# Patient Record
Sex: Male | Born: 1965 | Race: White | Hispanic: No | Marital: Married | Smoking: Former smoker
Health system: Southern US, Community
[De-identification: ages and names within clinical notes are randomized; demographics above are authoritative.]

## PROBLEM LIST (undated history)

## (undated) ENCOUNTER — Ambulatory Visit (HOSPITAL_BASED_OUTPATIENT_CLINIC_OR_DEPARTMENT_OTHER): Source: Home / Self Care

## (undated) DIAGNOSIS — G629 Polyneuropathy, unspecified: Secondary | ICD-10-CM

## (undated) DIAGNOSIS — E119 Type 2 diabetes mellitus without complications: Secondary | ICD-10-CM

## (undated) DIAGNOSIS — R112 Nausea with vomiting, unspecified: Secondary | ICD-10-CM

## (undated) DIAGNOSIS — I639 Cerebral infarction, unspecified: Secondary | ICD-10-CM

## (undated) DIAGNOSIS — F449 Dissociative and conversion disorder, unspecified: Secondary | ICD-10-CM

## (undated) DIAGNOSIS — G43909 Migraine, unspecified, not intractable, without status migrainosus: Secondary | ICD-10-CM

## (undated) DIAGNOSIS — G43409 Hemiplegic migraine, not intractable, without status migrainosus: Secondary | ICD-10-CM

## (undated) DIAGNOSIS — I10 Essential (primary) hypertension: Secondary | ICD-10-CM

## (undated) DIAGNOSIS — R531 Weakness: Secondary | ICD-10-CM

## (undated) DIAGNOSIS — F419 Anxiety disorder, unspecified: Secondary | ICD-10-CM

## (undated) DIAGNOSIS — Z9889 Other specified postprocedural states: Secondary | ICD-10-CM

## (undated) DIAGNOSIS — M1990 Unspecified osteoarthritis, unspecified site: Secondary | ICD-10-CM

## (undated) DIAGNOSIS — F329 Major depressive disorder, single episode, unspecified: Secondary | ICD-10-CM

## (undated) DIAGNOSIS — Z8711 Personal history of peptic ulcer disease: Secondary | ICD-10-CM

## (undated) DIAGNOSIS — E785 Hyperlipidemia, unspecified: Secondary | ICD-10-CM

## (undated) DIAGNOSIS — Z87442 Personal history of urinary calculi: Secondary | ICD-10-CM

## (undated) DIAGNOSIS — I519 Heart disease, unspecified: Secondary | ICD-10-CM

## (undated) DIAGNOSIS — Z8619 Personal history of other infectious and parasitic diseases: Secondary | ICD-10-CM

## (undated) DIAGNOSIS — K219 Gastro-esophageal reflux disease without esophagitis: Secondary | ICD-10-CM

## (undated) DIAGNOSIS — F32A Depression, unspecified: Secondary | ICD-10-CM

## (undated) DIAGNOSIS — H9190 Unspecified hearing loss, unspecified ear: Secondary | ICD-10-CM

## (undated) HISTORY — PX: HAND SURGERY: SHX662

## (undated) HISTORY — DX: Heart disease, unspecified: I51.9

## (undated) HISTORY — DX: Depression, unspecified: F32.A

## (undated) HISTORY — PX: TONSILLECTOMY: SUR1361

## (undated) HISTORY — PX: HERNIA REPAIR: SHX51

## (undated) HISTORY — PX: CORONARY ANGIOPLASTY: SHX604

## (undated) HISTORY — DX: Personal history of other infectious and parasitic diseases: Z86.19

## (undated) HISTORY — PX: CHOLECYSTECTOMY: SHX55

## (undated) HISTORY — PX: ADENOIDECTOMY: SUR15

## (undated) HISTORY — DX: Migraine, unspecified, not intractable, without status migrainosus: G43.909

## (undated) HISTORY — PX: COLONOSCOPY W/ ENDOSCOPIC US: SHX1379

## (undated) HISTORY — DX: Anxiety disorder, unspecified: F41.9

## (undated) HISTORY — PX: LITHOTRIPSY: SUR834

## (undated) HISTORY — PX: SHOULDER ARTHROSCOPY WITH OPEN ROTATOR CUFF REPAIR: SHX6092

## (undated) HISTORY — PX: APPENDECTOMY: SHX54

## (undated) HISTORY — DX: Polyneuropathy, unspecified: G62.9

---
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
## 1998-12-28 ENCOUNTER — Emergency Department (HOSPITAL_COMMUNITY): Admission: EM | Admit: 1998-12-28 | Discharge: 1998-12-28 | Payer: Self-pay | Admitting: Emergency Medicine

## 1998-12-28 ENCOUNTER — Encounter: Payer: Self-pay | Admitting: Emergency Medicine

## 1999-05-15 ENCOUNTER — Encounter: Payer: Self-pay | Admitting: Emergency Medicine

## 1999-05-15 ENCOUNTER — Emergency Department (HOSPITAL_COMMUNITY): Admission: EM | Admit: 1999-05-15 | Discharge: 1999-05-15 | Payer: Self-pay | Admitting: *Deleted

## 1999-12-18 ENCOUNTER — Emergency Department (HOSPITAL_COMMUNITY): Admission: EM | Admit: 1999-12-18 | Discharge: 1999-12-18 | Payer: Self-pay | Admitting: Emergency Medicine

## 2000-02-01 ENCOUNTER — Emergency Department (HOSPITAL_COMMUNITY): Admission: EM | Admit: 2000-02-01 | Discharge: 2000-02-02 | Payer: Self-pay | Admitting: Emergency Medicine

## 2000-02-01 ENCOUNTER — Encounter: Payer: Self-pay | Admitting: Emergency Medicine

## 2000-02-02 ENCOUNTER — Encounter: Payer: Self-pay | Admitting: Emergency Medicine

## 2000-03-15 ENCOUNTER — Observation Stay (HOSPITAL_COMMUNITY): Admission: EM | Admit: 2000-03-15 | Discharge: 2000-03-15 | Payer: Self-pay | Admitting: Emergency Medicine

## 2000-03-15 ENCOUNTER — Encounter: Payer: Self-pay | Admitting: Emergency Medicine

## 2000-04-09 ENCOUNTER — Emergency Department (HOSPITAL_COMMUNITY): Admission: EM | Admit: 2000-04-09 | Discharge: 2000-04-09 | Payer: Self-pay | Admitting: Emergency Medicine

## 2000-07-05 ENCOUNTER — Encounter: Payer: Self-pay | Admitting: Emergency Medicine

## 2000-07-05 ENCOUNTER — Emergency Department (HOSPITAL_COMMUNITY): Admission: EM | Admit: 2000-07-05 | Discharge: 2000-07-05 | Payer: Self-pay

## 2000-07-08 ENCOUNTER — Inpatient Hospital Stay (HOSPITAL_COMMUNITY): Admission: EM | Admit: 2000-07-08 | Discharge: 2000-07-14 | Payer: Self-pay | Admitting: Orthopedic Surgery

## 2000-07-12 ENCOUNTER — Encounter: Payer: Self-pay | Admitting: Orthopedic Surgery

## 2000-07-22 ENCOUNTER — Ambulatory Visit (HOSPITAL_BASED_OUTPATIENT_CLINIC_OR_DEPARTMENT_OTHER): Admission: RE | Admit: 2000-07-22 | Discharge: 2000-07-22 | Payer: Self-pay | Admitting: Urology

## 2000-08-23 ENCOUNTER — Ambulatory Visit (HOSPITAL_BASED_OUTPATIENT_CLINIC_OR_DEPARTMENT_OTHER): Admission: RE | Admit: 2000-08-23 | Discharge: 2000-08-23 | Payer: Self-pay | Admitting: Urology

## 2000-08-24 ENCOUNTER — Inpatient Hospital Stay (HOSPITAL_COMMUNITY): Admission: EM | Admit: 2000-08-24 | Discharge: 2000-08-25 | Payer: Self-pay | Admitting: Urology

## 2000-12-04 ENCOUNTER — Emergency Department (HOSPITAL_COMMUNITY): Admission: EM | Admit: 2000-12-04 | Discharge: 2000-12-04 | Payer: Self-pay | Admitting: Emergency Medicine

## 2000-12-04 ENCOUNTER — Encounter: Payer: Self-pay | Admitting: Emergency Medicine

## 2001-04-09 ENCOUNTER — Emergency Department (HOSPITAL_COMMUNITY): Admission: EM | Admit: 2001-04-09 | Discharge: 2001-04-09 | Payer: Self-pay | Admitting: *Deleted

## 2001-08-31 ENCOUNTER — Encounter: Admission: RE | Admit: 2001-08-31 | Discharge: 2001-08-31 | Payer: Self-pay | Admitting: *Deleted

## 2001-11-09 ENCOUNTER — Encounter (INDEPENDENT_AMBULATORY_CARE_PROVIDER_SITE_OTHER): Payer: Self-pay | Admitting: Specialist

## 2001-11-09 ENCOUNTER — Inpatient Hospital Stay (HOSPITAL_COMMUNITY): Admission: EM | Admit: 2001-11-09 | Discharge: 2001-11-11 | Payer: Self-pay

## 2001-11-11 ENCOUNTER — Inpatient Hospital Stay (HOSPITAL_COMMUNITY): Admission: EM | Admit: 2001-11-11 | Discharge: 2001-11-13 | Payer: Self-pay | Admitting: Emergency Medicine

## 2002-03-11 ENCOUNTER — Emergency Department (HOSPITAL_COMMUNITY): Admission: EM | Admit: 2002-03-11 | Discharge: 2002-03-11 | Payer: Self-pay | Admitting: Emergency Medicine

## 2002-03-12 ENCOUNTER — Ambulatory Visit (HOSPITAL_COMMUNITY): Admission: RE | Admit: 2002-03-12 | Discharge: 2002-03-12 | Payer: Self-pay | Admitting: *Deleted

## 2002-04-26 ENCOUNTER — Emergency Department (HOSPITAL_COMMUNITY): Admission: EM | Admit: 2002-04-26 | Discharge: 2002-04-26 | Payer: Self-pay | Admitting: Emergency Medicine

## 2004-07-06 ENCOUNTER — Encounter: Admission: RE | Admit: 2004-07-06 | Discharge: 2004-07-06 | Payer: Self-pay | Admitting: Occupational Medicine

## 2008-04-21 ENCOUNTER — Emergency Department (HOSPITAL_COMMUNITY): Admission: EM | Admit: 2008-04-21 | Discharge: 2008-04-21 | Payer: Self-pay | Admitting: Emergency Medicine

## 2008-07-03 ENCOUNTER — Emergency Department (HOSPITAL_COMMUNITY): Admission: EM | Admit: 2008-07-03 | Discharge: 2008-07-03 | Payer: Self-pay | Admitting: Emergency Medicine

## 2010-01-25 ENCOUNTER — Inpatient Hospital Stay (HOSPITAL_COMMUNITY): Admission: EM | Admit: 2010-01-25 | Discharge: 2010-01-31 | Payer: Self-pay | Admitting: Emergency Medicine

## 2010-10-18 LAB — CBC
HCT: 35.9 % — ABNORMAL LOW (ref 39.0–52.0)
Hemoglobin: 11.7 g/dL — ABNORMAL LOW (ref 13.0–17.0)
Hemoglobin: 12.7 g/dL — ABNORMAL LOW (ref 13.0–17.0)
MCH: 29.9 pg (ref 26.0–34.0)
MCHC: 34.8 g/dL (ref 30.0–36.0)
MCHC: 35.5 g/dL (ref 30.0–36.0)
MCV: 84.3 fL (ref 78.0–100.0)
Platelets: 210 10*3/uL (ref 150–400)
RDW: 13.6 % (ref 11.5–15.5)
RDW: 14 % (ref 11.5–15.5)
WBC: 10.5 10*3/uL (ref 4.0–10.5)

## 2010-10-18 LAB — GLUCOSE, CAPILLARY
Glucose-Capillary: 141 mg/dL — ABNORMAL HIGH (ref 70–99)
Glucose-Capillary: 142 mg/dL — ABNORMAL HIGH (ref 70–99)
Glucose-Capillary: 110 mg/dL — ABNORMAL HIGH (ref 70–99)
Glucose-Capillary: 114 mg/dL — ABNORMAL HIGH (ref 70–99)
Glucose-Capillary: 133 mg/dL — ABNORMAL HIGH (ref 70–99)
Glucose-Capillary: 134 mg/dL — ABNORMAL HIGH (ref 70–99)
Glucose-Capillary: 140 mg/dL — ABNORMAL HIGH (ref 70–99)
Glucose-Capillary: 147 mg/dL — ABNORMAL HIGH (ref 70–99)
Glucose-Capillary: 154 mg/dL — ABNORMAL HIGH (ref 70–99)
Glucose-Capillary: 154 mg/dL — ABNORMAL HIGH (ref 70–99)
Glucose-Capillary: 95 mg/dL (ref 70–99)

## 2010-10-18 LAB — CREATININE, SERUM
Creatinine, Ser: 0.76 mg/dL (ref 0.4–1.5)
GFR calc Af Amer: >60 mL/min (ref >60)

## 2010-10-18 LAB — WOUND CULTURE

## 2010-10-18 LAB — DIFFERENTIAL
Basophils Absolute: 0 10*3/uL (ref 0.0–0.1)
Basophils Absolute: 0.1 10*3/uL (ref 0.0–0.1)
Basophils Relative: 0 % (ref 0–1)
Eosinophils Absolute: 0.3 10*3/uL (ref 0.0–0.7)
Eosinophils Relative: 3 % (ref 0–5)
Lymphocytes Relative: 19 % (ref 12–46)
Monocytes Absolute: 0.6 10*3/uL (ref 0.1–1.0)
Monocytes Relative: 6 % (ref 3–12)
Monocytes Relative: 7 % (ref 3–12)
Neutro Abs: 7.5 10*3/uL (ref 1.7–7.7)
Neutrophils Relative %: 74 % (ref 43–77)

## 2010-10-18 LAB — VANCOMYCIN, TROUGH: Vancomycin Tr: 21.6 ug/mL — ABNORMAL HIGH (ref 10.0–20.0)

## 2010-10-18 LAB — BASIC METABOLIC PANEL
BUN: 7 mg/dL (ref 6–23)
BUN: 9 mg/dL (ref 6–23)
CO2: 28 mEq/L (ref 19–32)
Calcium: 8.7 mg/dL (ref 8.4–10.5)
Chloride: 99 mEq/L (ref 96–112)
Creatinine, Ser: 0.72 mg/dL (ref 0.4–1.5)
GFR calc non Af Amer: >60 mL/min (ref >60)
Glucose, Bld: 166 mg/dL — ABNORMAL HIGH (ref 70–99)
Glucose, Bld: 183 mg/dL — ABNORMAL HIGH (ref 70–99)
Potassium: 3.4 mEq/L — ABNORMAL LOW (ref 3.5–5.1)
Sodium: 135 mEq/L (ref 135–145)
Sodium: 137 mEq/L (ref 135–145)

## 2010-10-18 LAB — HEMOGLOBIN A1C
Hgb A1c MFr Bld: 6.8 % — ABNORMAL HIGH (ref <5.7)
Mean Plasma Glucose: 148 mg/dL — ABNORMAL HIGH (ref <117)

## 2010-12-18 NOTE — Op Note (Signed)
[facility]  Patient:    Jared Wood, Jared Wood                            MRN: [id number] Proc. Date: 07/12/00 Attending:  Aron Baba, M.D.                           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 The patients previously undergone fasciotomy and a carpal tunnel release as well as I&D. He now presents for I&D closure of fasciotomies and pinning of his first [facility] joint.  PREOPERATIVE DIAGNOSES:  Status post compartment syndrome, subacute evolving and acute evolving carpal tunnel syndrome of the left hand with first [facility] joint subluxation and dislocation. The patients previously undergone fasciotomy and a carpal tunnel release as well as I&D. He now presents for I&D closure of fasciotomies and pinning of his first [facility] joint.  PROCEDURE: 1. Incision and drainage skin, subcutaneous, and muscle. 2. Closure of 5-6 cm fasciotomy sites x 2. 3. Reduction and pinning first [facility] joint dislocation.  SURGEON:  Dominica Severin, M.D.  ASSISTANT:  Donavan Burnet, P.A.  COMPLICATIONS:  None.  ANESTHESIA:  General endotracheal.  ESTIMATED BLOOD LOSS:  Minimal.  TOURNIQUET TIME:  Zero.  INDICATIONS FOR PROCEDURE:  Mr. Jared Wood is a very pleasant white male who presents with the above mentioned diagnosis. I have outlined his treatment today and etc. in his chart office notes and on previous operative dictations. He presented Friday with an evolving compartment syndrome. Fasciotomies and carpal tunnel release were performed. The patient was noted to have [facility] joint subluxation and dislocation on initial office visit that Friday. He was taken to the operative suite where compartment syndrome and carpal tunnel syndrome which were evolving and occurring acutely were taken care of with I&D and release. Following  this, he underwent manipulation and placement into a splint. He was taken back 48 hours later for I&D. Fasciotomy sites were not ready for closure. Thus he was taken back today for fasciotomy closure and at this time, I have elected to proceed with pinning of the [facility] joint. I did not want to pin him acutely with open wounds. The patient understands the risks and benefits of surgery, etc. I have outlined this to him at length.  The patient underwent I&D, closure of fasciotomy, followed by pinning of the [facility] joint which was previously dislocated. Manipulation under fluoro was accomplished to document the [facility] stability, etc.  DESCRIPTION OF PROCEDURE:  The patient was seen by myself and anesthesia. He was taken to the operative suite. Preoperative Ancef was given. He was laid supine and appropriately padded and prepped and draped in the usual sterile fashion. A 10 minute surgical Betadine scrub followed by sterile Betadine painting and draping ensued. Once this was done, a sterile field was outlined. The patient had the I&D performed. This included skin, subcutaneous tissue and muscle, excisional debridement of any nonviable tissue was accomplished. The muscles were pink, healthy, contractile and had excellent color. I was very happy with this. Following copious I&D, the patient underwent closure with 4-0 Prolene in an interrupted fashion and he tolerated this well. Once this was accomplished and the areas were closed with difficulty or undue tension, attention was then directed towards the first [facility] joint. Fluoro was brought  in. I was able to document the subluxation and dislocation and following this reduction. The patient was fairly stable to reduction but certainly with manipulative efforts could be pushed out easily. Thus I elected to pin this joint. At present time, I would not recommend a formal ligamentous reconstruction; however, if pinning fails this would be an  operative consideration. Given the fasciotomies that were performed, etc., I elected to pin the joint. I pinned this joint by utilizing 0.45 K wire inserted from the thumb, first metacarpal into the trapezium and from the first to the second metacarpal. This was done under fluoro without difficulty, excellent purchase was obtained. The pins were bent upon themselves, cut and pin caps were placed. They were left outside the skin. They were away from the fasciotomy site. The patient had permanent x-rays taken for documentation. The patient tolerated the procedure well without difficulty. Following this, xeroform was placed about the wound followed by gauze, Webril, Kerlix and thumb spica splint. He was transferred to the recovery room in stable condition. All sponge, needle and instrument counts were reported as correct. There were no complications. DD:  07/12/00 TD:  07/12/00 Job: [id number]
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
## 2010-12-18 NOTE — Cardiovascular Report (Signed)
[place]  Patient:    Jared Wood, Jared Wood                            MRN: [id number] Proc. Date: 03/15/00 Adm. Date:  [id number] Attending:  Nathen May CC:         Daisey Must, M.D. [facility]  Nathen May, M.D., [facility]  Modesto Charon, M.D.   Cardiac Catheterization  DATE OF BIRTH:  05-17-1966.  REFERRING PHYSICIANS Daisey Must, M.D. Nathen May, M.D.  PROCEDURES PERFORMED 1. Selective coronary angiography. 2. Ventriculography.  DIAGNOSES 1. Chest pain of unspecified origin. 2. No evidence of flow-limiting coronary artery disease.  HISTORY:  The patient is a 45 year old white male with no prior cardiac history.  The patient has been referred for diagnostic catheterization to assess coronary anatomy due to continuous substernal chest pain.  DESCRIPTION OF PROCEDURE:  After informed consent was obtained, patient was brought to the catheterization laboratory.  A 6-French arterial sheath was placed in the right femoral artery using the modified Seldinger technique. Subsequent preformed JL4 and JR4 catheters were used to engage the left and right coronary system.  Selective coronary angiography was performed from various projections using manual contract injection.  After coronary angiography, ventriculography was performed and appropriate left-sided hemodynamics were obtained using a 6-French curved pigtail catheter. Ventriculography was then performed in single-plane RAO projection using power injections of contrast.  At termination of the case, all catheters and sheaths were removed.  The patient was sent back to the holding area and no complications were noted.  HEMODYNAMICS:  Left ventricular pressure 110/24 mmHg.  Aortic pressure 110/73 mmHg.  VENTRICULOGRAPHY:  Ejection fraction of 65% with no segmental wall motion abnormalities and no mitral regurgitation noted.  CORONARY ANGIOGRAPHY:  Left main  coronary artery has no evidence of flow-limiting coronary artery disease.  This is a large vessel.  Left anterior descending artery has no evidence of flow-limiting coronary artery disease.  Several diagonal branches arising from the LAD.  Left circumflex coronary artery is a large-caliber vessel with no evidence of flow-limiting coronary artery disease.  Of note is that the LAD and circumflex have near separate ostia and JR5 catheter was used to engage the ostium of the circumflex coronary artery.  Right coronary artery was a large-caliber vessel with no evidence of flow-limiting coronary artery disease.  CONCLUSION 1. Noncardiac chest pain. 2. No evidence of flow-limiting coronary artery disease.  RECOMMENDATION:  Results have been discussed with Dr. Gerri Spore, who has recommended the patient can be discharged to home later today.  Patient can receive a workup for noncardiac chest pain as an outpatient.  There is no evidence of pulmonary embolism, as pretest probability is extremely low.  We will leave further recommendations to Dr. Cliffton Asters. DD:  03/15/00 TD:  03/15/00 Job: [id number]

## 2010-12-18 NOTE — Discharge Summary (Signed)
. [facility]  Patient:    Jared Wood, Jared Wood                            MRN: [id number] Adm. Date:  [id number] Disc. Date: [id number] Attending:  Nathen May Dictator:   Tereso Newcomer, P.A. CC:         Modesto Charon, M.D., Rosalita Levan, [place]   Referring Physician Discharge [facility]  DATE OF BIRTH:  10/21/1965.  DISCHARGE DIAGNOSES 1. Hypertension. 2. Hiatal hernia. 3. Gastroesophageal reflux disease. 4. Status post tonsillectomy. 5. History of aspirin allergy resulting in anaphylaxis seven years ago.  PROCEDURE PERFORMED:  Cardiac catheterization on March 15, 2000 revealing no flow-limiting coronary artery disease and a normal left ventricular function, with an ejection fraction of 65%.  REASON FOR ADMISSION:  Chest pain.  ADMISSION HISTORY:  This 45 year old male with no history of coronary artery disease and a history of hypertension, presented to the emergency room at [facility] with two hours of substernal chest pain radiating to his left arm.  He stated that he had had a stressful day at work and went home at 10 p.m. and went to sleep.  He awoke with chest pain and became diaphoretic 30 minutes later.  His pain continued to escalate.  He decided to call EMS and was brought to the emergency room.  He denied any palpitations, nausea, vomiting, diarrhea, shortness of breath, hematochezia, melena, PND, orthopnea or palpitations; however, he did note some hematemesis occasionally.  He rated his chest pain as a 5-6/10.  PHYSICAL EXAMINATION:  Initial physical exam performed by Dr. Doreatha Martin revealed an alert and oriented male in no acute distress.  Blood pressure 128/61, pulse 75, respirations 14.  NECK:  Without lymphadenopathy.  LUNGS:  Clear to auscultation bilaterally.  CARDIAC: Regular rate and rhythm.  Normal S1 and S2.  No murmurs, rubs, or gallops. ABDOMEN:  Soft, nontender, nondistended.  Positive bowel sounds.   EXTREMITIES: No edema.  RECTAL:  Guaiac negative.  LABORATORY AND X-RAY FINDINGS:  Hemoglobin 14.7, hematocrit 40, WBC 11,200, platelet count 259,000.  Sodium 140, potassium 3.5, chloride 106, CO2 27, BUN 19, creatinine 1.3, glucose 121.  Initial CK 175, CK-MB 1.1, troponin I less than 0.03.  Chest x-ray was without pulmonary edema or infiltrates.  EKG revealed normal sinus rhythm with heart rate of 80, no ST or T wave changes, no Q waves.  HOSPITAL COURSE:  Patient was admitted and ruled out for myocardial infarction by enzymes.  His second set of enzymes revealed a total CK of 112, CK-MB 1.3, troponin I less than 0.03.  Given the patients worrisome chest pain with classical features and his cardiac risk factors which include hypertension and positive family history, it was felt that the patient could go for Cardiolite scan, if his pain abated; however, if his pain continued, it was felt he probably should go for cardiac catheterization, to make a definitive diagnosis.  In the later morning hours of March 15, 2000, it was noted that patient was still having chest pain.  He still was without any EKG changes. It was decided that he should go for cardiac catheterization.  He underwent the procedure without any immediate complications.  The results are noted above.  It was felt that he could be discharged to home once his rest period was up and his groin was stable.  No further cardiac followup would be needed and he could  follow up with his primary care physician for groin check in two weeks.  At the time of this dictation, patients rest period is still ongoing.  DISCHARGE MEDICATIONS:  Dosages and scheduling of the following medications were not available at time of admission.  His medicines are as previously. 1. Maxzide. 2. Propranolol. 3. Prilosec. 4. Allegra.  ACTIVITY:  No heavy lifting, driving, exertion or sex for three days.  DIET:  Low-fat, low-cholesterol, low-sodium  diet.  WOUND CARE:  The patient should watch his groin for any increased swelling, bleeding or bruising.  Call office with concerns.  FOLLOWUP:  He is to follow up with Dr. Modesto Charon in two weeks for groin check and he should call to make an appointment.  At the time of this dictation, Dr. [facility] office is closed. DD:  03/15/00 TD:  03/16/00 Job: [id number]

## 2010-12-18 NOTE — Op Note (Signed)
[facility]  Patient:    Jared Wood, Jared Wood                            MRN: [id number] Proc. Date: 07/10/00 Adm. Date:  [id number] Attending:  Dominica Severin                           Operative Report  DATE OF BIRTH:  2066/05/04  PREOPERATIVE DIAGNOSIS:  Compartment syndrome, left hand, subacute, status post carpal tunnel release and fasciotomies about the left hand.  POSTOPERATIVE DIAGNOSIS:  Compartment syndrome, left hand, subacute, status post carpal tunnel release and fasciotomies about the left hand.  PROCEDURE:  Irrigation and drainage of left hand, including skin, subcutaneous tissue, and muscle, with inspection of muscles for viability and assessment for possible closure.  SURGEON:  Elisha Ponder, M.D.  ASSISTANTS:  None.  COMPLICATIONS:  None.  ESTIMATED BLOOD LOSS:  Minimal.  TOURNIQUET TIME:  Zero.  INDICATIONS FOR THE PROCEDURE:  Jared Wood is a 45 year old white male with the above-mentioned diagnoses, who history has been detailed in his prior operative note 48 hours ago.  The patient has undergone fasciotomies about the hand.  He has had carpal tunnel release as well.  His swelling and digital range of motion have improved dramatically.  We are planning wash out and possible closure based upon wound conditions.  He understands all risks and benefits.  OPERATIVE FINDINGS:  This patient had no signs of infection.  All muscle was viable.  It was very robust and bulging in the thenar and first dorsal interosseous (first dorsal web space) region.  The patient underwent I&D of the skin, subcutaneous tissues, and muscle, as well as assessment for possible closure.  Given the condition of his hand, I felt that the skin was not ready for definitive closure.  He will have a repeat wash out and closure versus split-thickness skin graft in 48 hours.  OPERATIVE PROCEDURE IN DETAIL:  The patient was seen by myself and anesthesia. He was taken  to the operative suite and underwent a smooth induction of general endotracheal anesthesia.  Following this, he was appropriately padded. The bed was turned and he was prepped and draped with the Betadine scrub followed by sterile Betadine painting and draping.  The left upper extremity was isolated with sterile drapes and once this was done, the patient underwent assessment of the fasciotomies.  The carpal tunnel release site and dorsal fasciotomy site over the hand region in the third and fourth web space region were clean and dry without signs of infection, dehiscence, or problems.  The first dorsal web space and thenar musculature region where a fasciotomy had been performed were noted to have large bulging muscle tissue.  This muscle was viable.  I inspected it with pinch for contractility, as well as color and consistency.  There was no obvious necrosis.  At this time, the patient underwent I&D with copious amounts of antibiotic saline.  The I&D includes skin, subcutaneous tissues, and muscle.  He tolerated this well.  Following this, I did inspect the muscle and skin region for possible closure.  I felt that this was too tight for a definite closure at this time and thus loosely approximated the thenar incision and left the first dorsal interosseous incision open.  We will plan for a repeat wash out in 48 hours with possible closure versus split-thickness skin graft as  necessary.  Overall wound conditions are favorable at this time.  We will monitor his status closely as an inpatient and continue IV antibiotics and pain management accordingly and proceed as noted above.  He was extubated in the operative suite and transferred to the recovery room in stable condition.  All sponge, needle, and instrument counts were reported as correct. DD:  07/10/00 TD:  07/10/00 Job: [id number]

## 2010-12-18 NOTE — Op Note (Signed)
[facility]  Patient:    Jared Wood, Jared Wood Visit Number: [id number] MRN: [id number]          Service Type: MED Location: 3W [phone] Attending Physician:  Bonnetta Barry Dictated by:   Velora Heckler, M.D. Proc. Date: 11/09/01 Admit Date:  11/09/2001   CC:         Tinnie Gens C. Quintella Reichert, M.D.   Operative Report  PREOPERATIVE DIAGNOSIS:  Acute appendicitis.  POSTOPERATIVE DIAGNOSIS:  Acute appendicitis.  PROCEDURE:  Laparoscopic appendectomy.  SURGEON:  Velora Heckler, M.D.  ANESTHESIA:  General per Dr. Almeta Monas.  ESTIMATED BLOOD LOSS:  Minimal.  PREPARATION:  Betadine.  COMPLICATIONS:  None.  INDICATIONS FOR PROCEDURE:  The patients a 45 year old white male who presents to the emergency department at the request of his primary care physician, Dr. Feliciana Rossetti, with a two day history of abdominal pain localizing to the right lower quadrant. CT scan of the abdomen and pelvis confirmed acute appendicitis. The patient was seen by general surgery and prepared for the operating room.  DESCRIPTION OF PROCEDURE:  The procedure was done in OR #11 at the [facility]. The patient is brought to the operating room, placed in the supine position on the operating room table. Following the administration of general anesthesia, the patient was prepped and draped in the usual strict aseptic fashion. After ascertaining that an adequate level of anesthesia had been obtained, a supraumbilical incision was made with a 15 blade. Dissection was carried down through the subcutaneous tissues. The fascia was incised in the midline, the peritoneal cavity was entered cautiously. A #0 Vicryl pursestring suture was placed in the fascia. A Hasson cannula was introduced under direct vision and secured with a pursestring suture. The abdomen was insufflated with carbon dioxide. The laparoscope was introduced under direct vision and the abdomen explored. An  operating port is placed in the right upper quadrant. Using a Glassman clamp, the cecum is mobilized. The base of the appendix appears normal. However, the distal 1/2 of the appendix is acutely inflamed, indurated consistent with acute appendicitis. The operating port is also placed in the left lower quadrant. Using the harmonic scalpel, a window is created at the base of the appendix. Using an endoGIA type stapler, the base of the appendix is transected. Good hemostasis was noted at the staple line. The appendiceal mesentery is divided using the harmonic scalpel for hemostasis. The entire appendix is excised and placed into an endocatch bag and withdrawn through the left lower quadrant port. The right lower quadrant is irrigated copiously with fluid which is evacuated. The staple line and mesentery is inspected and good hemostasis is noted. Ports are removed under direct vision. Good hemostasis is noted at port sites. The pneumoperitoneum is release. The #0 Vicryl pursestring suture is tied securely. All three port sites are anesthetized with local anesthetic. All three port sites are closed with interrupted 4-0 Vicryl subcuticular sutures. Benzoin and Steri-Strips are applied. Sterile dressings are applied. The patient is awakened from anesthesia and brought to the recovery room in stable condition. The patient tolerated the procedure well. Dictated by:   Velora Heckler, M.D. Attending Physician:  Bonnetta Barry DD:  11/09/01 TD:  11/10/01 Job: [id number]
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
## 2010-12-18 NOTE — Op Note (Signed)
[place]  Patient:    Jared Wood, Jared Wood                            MRN: [id number] Proc. Date: 08/23/00 Adm. Date:  [id number] Attending:  Ellwood Handler CC:         Arlan Organ, M.D., [place]                           Operative Report  DATE OF BIRTH:  04-24-1966.  REFERRING PHYSICIAN:  Arlan Organ, M.D., [place], [place].  PREOPERATIVE DIAGNOSES:  Phimosis, balanitis, and preputial adhesions.  POSTOPERATIVE DIAGNOSES:  Phimosis, balanitis, and preputial adhesions.  DRAINS:  None.  COMPLICATIONS:  None.  DESCRIPTION OF PROCEDURE:  Patient was prepped and draped in a supine position.  After institution of an adequate level of general anesthesia (LMA), penile block instituted circumferentially around the base of the penis using 0.25% lidocaine without epinephrine.  Circumferential incision was made proximal to the subcoronal sulcus.  A similar incision was made proximal to the original incision, and a ring of fibrotic preputial skin was removed in a "parallel lines technique."  Bleeding sites were lightly fulgurated using needle-tip cautery.  Subcutaneous tissue was reapproximated with interrupted sutures of 4-0 Vicryl.  Skin was reapproximated with interrupted sutures of 4-0 chromic.  Frenular adhesions at the 6 oclock position were then incised horizontally and sewn back vertically with interrupted sutures of 4-0 chromic. Wound was covered with bacitracin ointment, dry gauze, and Coban tape, and patient was returned to recovery in satisfactory condition. DD:  08/23/00 TD:  08/23/00 Job: [id number]

## 2010-12-18 NOTE — Op Note (Signed)
[facility]  Patient:    SILVIA, MARKUSON                            MRN: [id number] Proc. Date: 09/13/00 Adm. Date:  [id number] Disc. Date: [id number] Attending:  Ellwood Handler CC:         Modesto Charon, M.D., [place], [place]   Operative Report  REFERRING PHYSICIAN:  Modesto Charon, M.D., [place], [place]  UROLOGIST:  Verl Dicker, M.D.  PREOPERATIVE DIAGNOSES:  Phimosis and balanitis.  POSTOPERATIVE DIAGNOSES:  Phimosis and balanitis.  PROCEDURE:  Circumcision.  ANESTHESIA:  General.  DRAINS:  None.  COMPLICATIONS:  None.  DESCRIPTION OF PROCEDURE:  The patient was prepped and draped in the dorsal lithotomy position.  After the institution of an adequate level of general anesthesia, fibrotic preputial skin was retracted; somewhat difficult to keep the skin retracted due to patients body habitus (5 feet 9 inches tall, 245 pounds).  A circumferential incision was made proximal to the subcoronal sulcus.  A similar incision was made proximal to the original incision, and ring of fibrotic preputial skin was removed in a "parallel lines technique". Small band of adhesions along the frenulum was then divided horizontally and sewn back together vertically.  Subcutaneous was reapproximated with 3-0 Vicryl.  Skin was reapproximated with 3-0 chromic.  The wound was covered with Bacitracin ointment, Telfa and Coban tape.  The patient was returned to recovery in satisfactory condition. DD:  09/13/00 TD:  09/13/00 Job: [id number]
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
## 2011-09-20 HISTORY — PX: ESOPHAGOGASTRODUODENOSCOPY: SHX1529

## 2012-02-17 ENCOUNTER — Encounter (INDEPENDENT_AMBULATORY_CARE_PROVIDER_SITE_OTHER): Payer: BC Managed Care – PPO | Admitting: Ophthalmology

## 2012-02-17 DIAGNOSIS — H43819 Vitreous degeneration, unspecified eye: Secondary | ICD-10-CM

## 2012-02-17 DIAGNOSIS — T1500XA Foreign body in cornea, unspecified eye, initial encounter: Secondary | ICD-10-CM

## 2012-02-17 DIAGNOSIS — S058X9A Other injuries of unspecified eye and orbit, initial encounter: Secondary | ICD-10-CM

## 2012-02-18 ENCOUNTER — Encounter (INDEPENDENT_AMBULATORY_CARE_PROVIDER_SITE_OTHER): Payer: BC Managed Care – PPO | Admitting: Ophthalmology

## 2012-02-18 DIAGNOSIS — S058X9A Other injuries of unspecified eye and orbit, initial encounter: Secondary | ICD-10-CM

## 2012-02-18 DIAGNOSIS — T1500XA Foreign body in cornea, unspecified eye, initial encounter: Secondary | ICD-10-CM

## 2013-04-11 ENCOUNTER — Encounter (INDEPENDENT_AMBULATORY_CARE_PROVIDER_SITE_OTHER): Payer: Self-pay | Admitting: Ophthalmology

## 2013-05-02 ENCOUNTER — Encounter (INDEPENDENT_AMBULATORY_CARE_PROVIDER_SITE_OTHER): Payer: Managed Care, Other (non HMO) | Admitting: Ophthalmology

## 2013-05-02 DIAGNOSIS — E11319 Type 2 diabetes mellitus with unspecified diabetic retinopathy without macular edema: Secondary | ICD-10-CM

## 2013-05-02 DIAGNOSIS — E1139 Type 2 diabetes mellitus with other diabetic ophthalmic complication: Secondary | ICD-10-CM

## 2013-05-02 DIAGNOSIS — H2510 Age-related nuclear cataract, unspecified eye: Secondary | ICD-10-CM

## 2013-05-02 DIAGNOSIS — I10 Essential (primary) hypertension: Secondary | ICD-10-CM

## 2013-05-02 DIAGNOSIS — H35039 Hypertensive retinopathy, unspecified eye: Secondary | ICD-10-CM

## 2013-05-02 DIAGNOSIS — H43819 Vitreous degeneration, unspecified eye: Secondary | ICD-10-CM

## 2014-05-07 ENCOUNTER — Ambulatory Visit (INDEPENDENT_AMBULATORY_CARE_PROVIDER_SITE_OTHER): Payer: Managed Care, Other (non HMO) | Admitting: Ophthalmology

## 2014-05-07 DIAGNOSIS — H35033 Hypertensive retinopathy, bilateral: Secondary | ICD-10-CM

## 2014-05-07 DIAGNOSIS — E11319 Type 2 diabetes mellitus with unspecified diabetic retinopathy without macular edema: Secondary | ICD-10-CM

## 2014-05-07 DIAGNOSIS — I10 Essential (primary) hypertension: Secondary | ICD-10-CM

## 2014-05-07 DIAGNOSIS — H43813 Vitreous degeneration, bilateral: Secondary | ICD-10-CM

## 2014-05-07 DIAGNOSIS — E11329 Type 2 diabetes mellitus with mild nonproliferative diabetic retinopathy without macular edema: Secondary | ICD-10-CM

## 2015-04-03 DIAGNOSIS — I639 Cerebral infarction, unspecified: Secondary | ICD-10-CM

## 2015-04-03 HISTORY — DX: Cerebral infarction, unspecified: I63.9

## 2015-04-07 DIAGNOSIS — E1159 Type 2 diabetes mellitus with other circulatory complications: Secondary | ICD-10-CM | POA: Insufficient documentation

## 2015-04-07 DIAGNOSIS — I639 Cerebral infarction, unspecified: Secondary | ICD-10-CM

## 2015-04-07 DIAGNOSIS — I2510 Atherosclerotic heart disease of native coronary artery without angina pectoris: Secondary | ICD-10-CM | POA: Insufficient documentation

## 2015-04-07 DIAGNOSIS — I10 Essential (primary) hypertension: Secondary | ICD-10-CM

## 2015-04-07 DIAGNOSIS — E782 Mixed hyperlipidemia: Secondary | ICD-10-CM | POA: Insufficient documentation

## 2015-04-07 DIAGNOSIS — E1149 Type 2 diabetes mellitus with other diabetic neurological complication: Secondary | ICD-10-CM | POA: Insufficient documentation

## 2015-04-07 DIAGNOSIS — E1142 Type 2 diabetes mellitus with diabetic polyneuropathy: Secondary | ICD-10-CM | POA: Insufficient documentation

## 2015-04-07 HISTORY — DX: Mixed hyperlipidemia: E78.2

## 2015-04-07 HISTORY — DX: Type 2 diabetes mellitus with diabetic polyneuropathy: E11.42

## 2015-04-07 HISTORY — DX: Essential (primary) hypertension: I10

## 2015-04-07 HISTORY — DX: Cerebral infarction, unspecified: I63.9

## 2015-04-08 DIAGNOSIS — F450 Somatization disorder: Secondary | ICD-10-CM

## 2015-04-08 HISTORY — DX: Somatization disorder: F45.0

## 2015-04-12 DIAGNOSIS — F444 Conversion disorder with motor symptom or deficit: Secondary | ICD-10-CM

## 2015-04-12 HISTORY — DX: Conversion disorder with motor symptom or deficit: F44.4

## 2015-05-08 ENCOUNTER — Ambulatory Visit (INDEPENDENT_AMBULATORY_CARE_PROVIDER_SITE_OTHER): Payer: Managed Care, Other (non HMO) | Admitting: Ophthalmology

## 2015-06-05 ENCOUNTER — Ambulatory Visit (INDEPENDENT_AMBULATORY_CARE_PROVIDER_SITE_OTHER): Payer: Managed Care, Other (non HMO) | Admitting: Ophthalmology

## 2015-06-30 DIAGNOSIS — R519 Headache, unspecified: Secondary | ICD-10-CM | POA: Insufficient documentation

## 2015-06-30 DIAGNOSIS — F447 Conversion disorder with mixed symptom presentation: Secondary | ICD-10-CM | POA: Insufficient documentation

## 2015-06-30 HISTORY — DX: Headache, unspecified: R51.9

## 2015-06-30 HISTORY — DX: Conversion disorder with mixed symptom presentation: F44.7

## 2015-07-24 DIAGNOSIS — M25562 Pain in left knee: Secondary | ICD-10-CM

## 2015-07-24 DIAGNOSIS — M25512 Pain in left shoulder: Secondary | ICD-10-CM | POA: Insufficient documentation

## 2015-07-24 DIAGNOSIS — G8929 Other chronic pain: Secondary | ICD-10-CM

## 2015-07-24 HISTORY — DX: Pain in left shoulder: M25.512

## 2015-07-24 HISTORY — DX: Other chronic pain: G89.29

## 2015-07-24 HISTORY — DX: Pain in left knee: M25.562

## 2015-08-14 DIAGNOSIS — M1712 Unilateral primary osteoarthritis, left knee: Secondary | ICD-10-CM

## 2015-08-14 HISTORY — DX: Unilateral primary osteoarthritis, left knee: M17.12

## 2015-11-18 ENCOUNTER — Other Ambulatory Visit: Payer: Self-pay | Admitting: Orthopedic Surgery

## 2015-11-18 DIAGNOSIS — Z01818 Encounter for other preprocedural examination: Secondary | ICD-10-CM

## 2015-11-21 ENCOUNTER — Encounter (HOSPITAL_COMMUNITY): Payer: Self-pay

## 2015-11-21 ENCOUNTER — Encounter (HOSPITAL_COMMUNITY)
Admission: RE | Admit: 2015-11-21 | Discharge: 2015-11-21 | Disposition: A | Discharge status: Home / Self Care | Payer: Managed Care, Other (non HMO) | Source: Ambulatory Visit | Attending: Orthopedic Surgery | Admitting: Orthopedic Surgery

## 2015-11-21 DIAGNOSIS — Z01818 Encounter for other preprocedural examination: Secondary | ICD-10-CM | POA: Diagnosis present

## 2015-11-21 DIAGNOSIS — Z0183 Encounter for blood typing: Secondary | ICD-10-CM | POA: Diagnosis not present

## 2015-11-21 DIAGNOSIS — Z01812 Encounter for preprocedural laboratory examination: Secondary | ICD-10-CM | POA: Diagnosis not present

## 2015-11-21 DIAGNOSIS — M1711 Unilateral primary osteoarthritis, right knee: Secondary | ICD-10-CM | POA: Insufficient documentation

## 2015-11-21 HISTORY — DX: Hemiplegic migraine, not intractable, without status migrainosus: G43.409

## 2015-11-21 HISTORY — DX: Gastro-esophageal reflux disease without esophagitis: K21.9

## 2015-11-21 HISTORY — DX: Hyperlipidemia, unspecified: E78.5

## 2015-11-21 HISTORY — DX: Other specified postprocedural states: Z98.890

## 2015-11-21 HISTORY — DX: Nausea with vomiting, unspecified: R11.2

## 2015-11-21 HISTORY — DX: Personal history of peptic ulcer disease: Z87.11

## 2015-11-21 HISTORY — DX: Unspecified osteoarthritis, unspecified site: M19.90

## 2015-11-21 HISTORY — DX: Essential (primary) hypertension: I10

## 2015-11-21 HISTORY — DX: Dissociative and conversion disorder, unspecified: F44.9

## 2015-11-21 HISTORY — DX: Personal history of urinary calculi: Z87.442

## 2015-11-21 HISTORY — DX: Type 2 diabetes mellitus without complications: E11.9

## 2015-11-21 HISTORY — DX: Weakness: R53.1

## 2015-11-21 LAB — CBC WITH DIFFERENTIAL/PLATELET
BASOS PCT: 1 %
Basophils Absolute: 0.1 10*3/uL (ref 0.0–0.1)
EOS ABS: 0.1 10*3/uL (ref 0.0–0.7)
EOS PCT: 2 %
HCT: 43.5 % (ref 39.0–52.0)
HEMOGLOBIN: 14.3 g/dL (ref 13.0–17.0)
LYMPHS ABS: 2.3 10*3/uL (ref 0.7–4.0)
Lymphocytes Relative: 32 %
MCH: 27.3 pg (ref 26.0–34.0)
MCHC: 32.9 g/dL (ref 30.0–36.0)
MCV: 83.2 fL (ref 78.0–100.0)
MONOS PCT: 8 %
Monocytes Absolute: 0.6 10*3/uL (ref 0.1–1.0)
NEUTROS PCT: 57 %
Neutro Abs: 4.1 10*3/uL (ref 1.7–7.7)
PLATELETS: 272 10*3/uL (ref 150–400)
RBC: 5.23 MIL/uL (ref 4.22–5.81)
RDW: 13.7 % (ref 11.5–15.5)
WBC: 7.2 10*3/uL (ref 4.0–10.5)

## 2015-11-21 LAB — URINALYSIS, ROUTINE W REFLEX MICROSCOPIC
BILIRUBIN URINE: NEGATIVE
HGB URINE DIPSTICK: NEGATIVE
Ketones, ur: NEGATIVE mg/dL
Leukocytes, UA: NEGATIVE
Nitrite: NEGATIVE
PROTEIN: NEGATIVE mg/dL
Specific Gravity, Urine: 1.039 — ABNORMAL HIGH (ref 1.005–1.030)
pH: 5.5 (ref 5.0–8.0)

## 2015-11-21 LAB — APTT: aPTT: 29 seconds (ref 24–37)

## 2015-11-21 LAB — COMPREHENSIVE METABOLIC PANEL
ALBUMIN: 4.2 g/dL (ref 3.5–5.0)
ALK PHOS: 41 U/L (ref 38–126)
ALT: 41 U/L (ref 17–63)
ANION GAP: 12 (ref 5–15)
AST: 26 U/L (ref 15–41)
BUN: 17 mg/dL (ref 6–20)
CHLORIDE: 108 mmol/L (ref 101–111)
CO2: 22 mmol/L (ref 22–32)
Calcium: 9.7 mg/dL (ref 8.9–10.3)
Creatinine, Ser: 0.95 mg/dL (ref 0.61–1.24)
GFR calc non Af Amer: >60 mL/min (ref >60)
GLUCOSE: 139 mg/dL — AB (ref 65–99)
POTASSIUM: 4.2 mmol/L (ref 3.5–5.1)
SODIUM: 142 mmol/L (ref 135–145)
Total Bilirubin: 0.6 mg/dL (ref 0.3–1.2)
Total Protein: 7.3 g/dL (ref 6.5–8.1)

## 2015-11-21 LAB — URINE MICROSCOPIC-ADD ON: RBC / HPF: NONE SEEN RBC/hpf (ref 0–5)

## 2015-11-21 LAB — GLUCOSE, CAPILLARY: GLUCOSE-CAPILLARY: 150 mg/dL — AB (ref 65–99)

## 2015-11-21 LAB — SURGICAL PCR SCREEN
MRSA, PCR: NEGATIVE
STAPHYLOCOCCUS AUREUS: POSITIVE — AB

## 2015-11-21 LAB — TYPE AND SCREEN
ABO/RH(D): O POS
ANTIBODY SCREEN: NEGATIVE

## 2015-11-21 LAB — PROTIME-INR
INR: 1.11 (ref 0.00–1.49)
Prothrombin Time: 14.5 seconds (ref 11.6–15.2)

## 2015-11-21 LAB — ABO/RH: ABO/RH(D): O POS

## 2015-11-21 NOTE — Progress Notes (Signed)
   11/21/15 1302  OBSTRUCTIVE SLEEP APNEA  Have you ever been diagnosed with sleep apnea through a sleep study? No  Do you snore loudly (loud enough to be heard through closed doors)?  1  Do you often feel tired, fatigued, or sleepy during the daytime (such as falling asleep during driving or talking to someone)? 1  Has anyone observed you stop breathing during your sleep? 0  Do you have, or are you being treated for high blood pressure? 1  BMI more than 35 kg/m2? 1  Age > 50 (1-yes) 0  Neck circumference greater than:Male 16 inches or larger, Male 17inches or larger? 1  Male Gender (Yes=1) 1  Obstructive Sleep Apnea Score 6  Score 5 or greater  Results sent to PCP   This patient has screened at risk for sleep apnea using the STOP Bang tool used during a pre-surgical visit. A score of 5 or greater is at risk for sleep apnea.

## 2015-11-21 NOTE — Progress Notes (Signed)
PCP is Dance movement psychotherapist is Aviva Kluver  CBG on arrival to PAT was 150, and patient informed Nurse that his blood glucose range from 135 to 95. Patient also stated that his last A1C was 7.   Patient denied having any acute cardiac or pulmonary issues, but informed Nurse that he has a sleep study ordered at [facility] next week.

## 2015-11-21 NOTE — Pre-Procedure Instructions (Signed)
Shashank Barranco  11/21/2015     Your procedure is scheduled on : Monday Dec 01, 2015 at 10:50 AM.  Report to [facility] Admitting at 8:50 AM.  Call this number if you have problems the morning of surgery: [phone]    Remember:  Do not eat food or drink liquids after midnight.  Take these medicines the morning of surgery with A SIP OF WATER : Cetirizine (Zyrtec), Escitalopram (Lexapro), Gabapentin (Neurontin), Omeprazole (Prilosec), Topiramate (Topamax)   Stop taking any vitamins, herbal medications/supplements, NSAIDs, Ibuprofen, Advil, Motrin, Aleve, Fish Oil, etc on Monday April 24th   Do NOT take any diabetic pills the morning of your surgery (NO Glyxambi, NO Metformin/Glucophage)    How to Manage Your Diabetes Before and After Surgery  Why is it important to control my blood sugar before and after surgery? . Improving blood sugar levels before and after surgery helps healing and can limit problems. . A way of improving blood sugar control is eating a healthy diet by: o  Eating less sugar and carbohydrates o  Increasing activity/exercise o  Talking with your doctor about reaching your blood sugar goals . High blood sugars (greater than 180 mg/dL) can raise your risk of infections and slow your recovery, so you will need to focus on controlling your diabetes during the weeks before surgery. . Make sure that the doctor who takes care of your diabetes knows about your planned surgery including the date and location.  How do I manage my blood sugar before surgery? . Check your blood sugar at least 4 times a day, starting 2 days before surgery, to make sure that the level is not too high or low. o Check your blood sugar the morning of your surgery when you wake up and every 2 hours until you get to the Short Stay unit. . If your blood sugar is less than 70 mg/dL, you will need to treat for low blood sugar: o Do not take insulin. o Treat a low blood sugar (less than 70 mg/dL)  with  cup of clear juice (cranberry or apple), 4 glucose tablets, OR glucose gel. o Recheck blood sugar in 15 minutes after treatment (to make sure it is greater than 70 mg/dL). If your blood sugar is not greater than 70 mg/dL on recheck, call [phone] for further instructions. . Report your blood sugar to the short stay nurse when you get to Short Stay.  . If you are admitted to the hospital after surgery: o Your blood sugar will be checked by the staff and you will probably be given insulin after surgery (instead of oral diabetes medicines) to make sure you have good blood sugar levels. o The goal for blood sugar control after surgery is 80-180 mg/dL.      WHAT DO I DO ABOUT MY DIABETES MEDICATION?   Marland Kitchen Do not take oral diabetes medicines (pills) the morning of surgery.  Reviewed and Endorsed by [facility] Patient Education Committee, August 2015   Do not wear jewelry.  Do not wear lotions, powders, or cologne.   Men may shave face and neck.  Do not bring valuables to the hospital.  [facility] is not responsible for any belongings or valuables.  Contacts, dentures or bridgework may not be worn into surgery.  Leave your suitcase in the car.  After surgery it may be brought to your room.  For patients admitted to the hospital, discharge time will be determined by your treatment team.  Patients  discharged the day of surgery will not be allowed to drive home.   Name and phone number of your driver:    Special instructions:  Shower using CHG soap the night before and the morning of your surgery  Please read over the following fact sheets that you were given. Pain Booklet, Coughing and Deep Breathing, Blood Transfusion Information, Total Joint Packet, MRSA Information and Surgical Site Infection Prevention

## 2015-11-22 LAB — URINE CULTURE: CULTURE: NO GROWTH

## 2015-11-22 LAB — HEMOGLOBIN A1C
HEMOGLOBIN A1C: 7 % — AB (ref 4.8–5.6)
MEAN PLASMA GLUCOSE: 154 mg/dL

## 2015-11-30 MED ORDER — ACETAMINOPHEN 500 MG PO TABS
1000.0000 mg | ORAL_TABLET | ORAL | Status: AC
  Administered 2015-12-01: 1000 mg via ORAL
  Filled 2015-11-30: qty 2

## 2015-11-30 MED ORDER — TRANEXAMIC ACID 1000 MG/10ML IV SOLN
1000.0000 mg | INTRAVENOUS | Status: AC
  Administered 2015-12-01: 1000 mg via INTRAVENOUS
  Filled 2015-11-30: qty 10

## 2015-11-30 MED ORDER — SODIUM CHLORIDE 0.9 % IV SOLN: INTRAVENOUS | Status: DC

## 2015-11-30 MED ORDER — CEFAZOLIN SODIUM 10 G IJ SOLR
3.0000 g | INTRAMUSCULAR | Status: AC
  Administered 2015-12-01: 3 g via INTRAVENOUS
  Filled 2015-11-30: qty 3000

## 2015-12-01 ENCOUNTER — Inpatient Hospital Stay (HOSPITAL_COMMUNITY): Payer: Managed Care, Other (non HMO) | Admitting: Anesthesiology

## 2015-12-01 ENCOUNTER — Encounter (HOSPITAL_COMMUNITY)
Admission: RE | Disposition: A | Discharge status: Home / Self Care | Payer: Self-pay | Source: Ambulatory Visit | Attending: Orthopedic Surgery

## 2015-12-01 ENCOUNTER — Encounter (HOSPITAL_COMMUNITY): Payer: Self-pay | Admitting: General Practice

## 2015-12-01 ENCOUNTER — Inpatient Hospital Stay (HOSPITAL_COMMUNITY)
Admission: RE | Admit: 2015-12-01 | Discharge: 2015-12-02 | DRG: 470 | Disposition: A | Discharge status: Home / Self Care | Payer: Managed Care, Other (non HMO) | Source: Ambulatory Visit | Attending: Orthopedic Surgery | Admitting: Orthopedic Surgery

## 2015-12-01 DIAGNOSIS — M1711 Unilateral primary osteoarthritis, right knee: Secondary | ICD-10-CM | POA: Diagnosis present

## 2015-12-01 DIAGNOSIS — Z9861 Coronary angioplasty status: Secondary | ICD-10-CM | POA: Diagnosis not present

## 2015-12-01 DIAGNOSIS — Z87891 Personal history of nicotine dependence: Secondary | ICD-10-CM | POA: Diagnosis not present

## 2015-12-01 DIAGNOSIS — Z96659 Presence of unspecified artificial knee joint: Secondary | ICD-10-CM

## 2015-12-01 DIAGNOSIS — D62 Acute posthemorrhagic anemia: Secondary | ICD-10-CM | POA: Diagnosis not present

## 2015-12-01 DIAGNOSIS — H9190 Unspecified hearing loss, unspecified ear: Secondary | ICD-10-CM | POA: Diagnosis present

## 2015-12-01 DIAGNOSIS — K219 Gastro-esophageal reflux disease without esophagitis: Secondary | ICD-10-CM | POA: Diagnosis present

## 2015-12-01 DIAGNOSIS — E119 Type 2 diabetes mellitus without complications: Secondary | ICD-10-CM | POA: Diagnosis present

## 2015-12-01 DIAGNOSIS — I10 Essential (primary) hypertension: Secondary | ICD-10-CM | POA: Diagnosis present

## 2015-12-01 DIAGNOSIS — Z8673 Personal history of transient ischemic attack (TIA), and cerebral infarction without residual deficits: Secondary | ICD-10-CM | POA: Diagnosis not present

## 2015-12-01 DIAGNOSIS — E785 Hyperlipidemia, unspecified: Secondary | ICD-10-CM | POA: Diagnosis present

## 2015-12-01 DIAGNOSIS — M25561 Pain in right knee: Secondary | ICD-10-CM | POA: Diagnosis present

## 2015-12-01 HISTORY — DX: Presence of unspecified artificial knee joint: Z96.659

## 2015-12-01 HISTORY — DX: Unspecified hearing loss, unspecified ear: H91.90

## 2015-12-01 HISTORY — PX: TOTAL KNEE ARTHROPLASTY: SHX125

## 2015-12-01 HISTORY — DX: Cerebral infarction, unspecified: I63.9

## 2015-12-01 LAB — CBC
HCT: 39.6 % (ref 39.0–52.0)
Hemoglobin: 13 g/dL (ref 13.0–17.0)
MCH: 27.4 pg (ref 26.0–34.0)
MCHC: 32.8 g/dL (ref 30.0–36.0)
MCV: 83.4 fL (ref 78.0–100.0)
PLATELETS: 248 10*3/uL (ref 150–400)
RBC: 4.75 MIL/uL (ref 4.22–5.81)
RDW: 13.5 % (ref 11.5–15.5)
WBC: 5.7 10*3/uL (ref 4.0–10.5)

## 2015-12-01 LAB — CREATININE, SERUM
Creatinine, Ser: 0.97 mg/dL (ref 0.61–1.24)
GFR calc Af Amer: >60 mL/min (ref >60)
GFR calc non Af Amer: >60 mL/min (ref >60)

## 2015-12-01 LAB — GLUCOSE, CAPILLARY
GLUCOSE-CAPILLARY: 126 mg/dL — AB (ref 65–99)
GLUCOSE-CAPILLARY: 108 mg/dL — AB (ref 65–99)
GLUCOSE-CAPILLARY: 157 mg/dL — AB (ref 65–99)
Glucose-Capillary: 131 mg/dL — ABNORMAL HIGH (ref 65–99)

## 2015-12-01 SURGERY — ARTHROPLASTY, KNEE, TOTAL
Anesthesia: Monitor Anesthesia Care | Site: Knee | Laterality: Right

## 2015-12-01 MED ORDER — ONDANSETRON HCL 4 MG/2ML IJ SOLN
4.0000 mg | Freq: Four times a day (QID) | INTRAMUSCULAR | Status: DC | PRN
Start: 2015-12-01 — End: 2015-12-02

## 2015-12-01 MED ORDER — BUPIVACAINE-EPINEPHRINE (PF) 0.5% -1:200000 IJ SOLN
INTRAMUSCULAR | Status: DC | PRN
  Administered 2015-12-01: 30 mL via PERINEURAL

## 2015-12-01 MED ORDER — HYDROMORPHONE HCL 1 MG/ML IJ SOLN
INTRAMUSCULAR | Status: AC
  Filled 2015-12-01: qty 1

## 2015-12-01 MED ORDER — BISACODYL 5 MG PO TBEC: 5.0000 mg | DELAYED_RELEASE_TABLET | Freq: Every day | ORAL | Status: DC | PRN

## 2015-12-01 MED ORDER — BUPIVACAINE-EPINEPHRINE 0.5% -1:200000 IJ SOLN
INTRAMUSCULAR | Status: DC | PRN
  Administered 2015-12-01: 30 mL

## 2015-12-01 MED ORDER — SODIUM CHLORIDE 0.9 % IJ SOLN
INTRAMUSCULAR | Status: DC | PRN
  Administered 2015-12-01: 20 mL

## 2015-12-01 MED ORDER — ONDANSETRON HCL 4 MG PO TABS: 4.0000 mg | ORAL_TABLET | Freq: Four times a day (QID) | ORAL | Status: DC | PRN

## 2015-12-01 MED ORDER — METOCLOPRAMIDE HCL 5 MG/ML IJ SOLN: 5.0000 mg | Freq: Three times a day (TID) | INTRAMUSCULAR | Status: DC | PRN

## 2015-12-01 MED ORDER — DIPHENHYDRAMINE HCL 12.5 MG/5ML PO ELIX: 12.5000 mg | ORAL_SOLUTION | ORAL | Status: DC | PRN

## 2015-12-01 MED ORDER — SODIUM CHLORIDE 0.9 % IR SOLN
Status: DC | PRN
Start: 2015-12-01 — End: 2015-12-01
  Administered 2015-12-01: 1000 mL

## 2015-12-01 MED ORDER — SENNOSIDES-DOCUSATE SODIUM 8.6-50 MG PO TABS: 1.0000 | ORAL_TABLET | Freq: Every evening | ORAL | Status: DC | PRN

## 2015-12-01 MED ORDER — LOSARTAN POTASSIUM 25 MG PO TABS
25.0000 mg | ORAL_TABLET | Freq: Every day | ORAL | Status: DC
  Administered 2015-12-01 – 2015-12-02 (×2): 25 mg via ORAL
  Filled 2015-12-01 (×2): qty 1

## 2015-12-01 MED ORDER — METFORMIN HCL 500 MG PO TABS
1000.0000 mg | ORAL_TABLET | Freq: Two times a day (BID) | ORAL | Status: DC
  Administered 2015-12-01 – 2015-12-02 (×2): 1000 mg via ORAL
  Filled 2015-12-01 (×2): qty 2

## 2015-12-01 MED ORDER — ONDANSETRON HCL 4 MG/2ML IJ SOLN
INTRAMUSCULAR | Status: DC | PRN
  Administered 2015-12-01: 4 mg via INTRAVENOUS

## 2015-12-01 MED ORDER — DEXAMETHASONE SODIUM PHOSPHATE 10 MG/ML IJ SOLN
INTRAMUSCULAR | Status: DC | PRN
Start: 2015-12-01 — End: 2015-12-01
  Administered 2015-12-01: 10 mg via INTRAVENOUS

## 2015-12-01 MED ORDER — BUPIVACAINE-EPINEPHRINE (PF) 0.5% -1:200000 IJ SOLN
INTRAMUSCULAR | Status: AC
  Filled 2015-12-01: qty 30

## 2015-12-01 MED ORDER — MIDAZOLAM HCL 2 MG/2ML IJ SOLN
2.0000 mg | Freq: Once | INTRAMUSCULAR | Status: AC
  Administered 2015-12-01: 2 mg via INTRAVENOUS

## 2015-12-01 MED ORDER — TOPIRAMATE 25 MG PO TABS
25.0000 mg | ORAL_TABLET | Freq: Two times a day (BID) | ORAL | Status: DC
  Administered 2015-12-01 – 2015-12-02 (×2): 25 mg via ORAL
  Filled 2015-12-01 (×2): qty 1

## 2015-12-01 MED ORDER — CHLORHEXIDINE GLUCONATE 4 % EX LIQD: 60.0000 mL | Freq: Once | CUTANEOUS | Status: DC

## 2015-12-01 MED ORDER — BUPIVACAINE IN DEXTROSE 0.75-8.25 % IT SOLN
INTRATHECAL | Status: DC | PRN
  Administered 2015-12-01: 2 mL via INTRATHECAL

## 2015-12-01 MED ORDER — METHOCARBAMOL 1000 MG/10ML IJ SOLN
500.0000 mg | Freq: Four times a day (QID) | INTRAVENOUS | Status: DC | PRN
  Filled 2015-12-01: qty 5

## 2015-12-01 MED ORDER — OXYCODONE HCL 5 MG PO TABS
5.0000 mg | ORAL_TABLET | ORAL | Status: DC | PRN
  Administered 2015-12-01 – 2015-12-02 (×5): 10 mg via ORAL
  Filled 2015-12-01 (×4): qty 2

## 2015-12-01 MED ORDER — CEFAZOLIN SODIUM 1-5 GM-% IV SOLN
1.0000 g | Freq: Four times a day (QID) | INTRAVENOUS | Status: AC
  Administered 2015-12-01 – 2015-12-02 (×2): 1 g via INTRAVENOUS
  Filled 2015-12-01 (×2): qty 50

## 2015-12-01 MED ORDER — DOCUSATE SODIUM 100 MG PO CAPS
100.0000 mg | ORAL_CAPSULE | Freq: Two times a day (BID) | ORAL | Status: DC
  Administered 2015-12-01 – 2015-12-02 (×2): 100 mg via ORAL
  Filled 2015-12-01 (×3): qty 1

## 2015-12-01 MED ORDER — PHENOL 1.4 % MT LIQD: 1.0000 | OROMUCOSAL | Status: DC | PRN

## 2015-12-01 MED ORDER — LACTATED RINGERS IV SOLN
INTRAVENOUS | Status: DC
  Administered 2015-12-01 (×3): via INTRAVENOUS

## 2015-12-01 MED ORDER — PANTOPRAZOLE SODIUM 40 MG PO TBEC
40.0000 mg | DELAYED_RELEASE_TABLET | Freq: Every day | ORAL | Status: DC
  Administered 2015-12-02: 40 mg via ORAL
  Filled 2015-12-01: qty 1

## 2015-12-01 MED ORDER — SODIUM CHLORIDE 0.9 % IV SOLN
INTRAVENOUS | Status: DC
  Administered 2015-12-01: 23:00:00 via INTRAVENOUS

## 2015-12-01 MED ORDER — EPHEDRINE SULFATE 50 MG/ML IJ SOLN
INTRAMUSCULAR | Status: DC | PRN
  Administered 2015-12-01 (×2): 5 mg via INTRAVENOUS
  Administered 2015-12-01 (×2): 10 mg via INTRAVENOUS

## 2015-12-01 MED ORDER — INSULIN ASPART 100 UNIT/ML ~~LOC~~ SOLN: 0.0000 [IU] | Freq: Every day | SUBCUTANEOUS | Status: DC

## 2015-12-01 MED ORDER — HYDROMORPHONE HCL 1 MG/ML IJ SOLN
0.2500 mg | INTRAMUSCULAR | Status: DC | PRN
  Administered 2015-12-01: 1 mg via INTRAVENOUS

## 2015-12-01 MED ORDER — FLEET ENEMA 7-19 GM/118ML RE ENEM: 1.0000 | ENEMA | Freq: Once | RECTAL | Status: DC | PRN

## 2015-12-01 MED ORDER — FENTANYL CITRATE (PF) 250 MCG/5ML IJ SOLN
INTRAMUSCULAR | Status: AC
  Filled 2015-12-01: qty 5

## 2015-12-01 MED ORDER — ESCITALOPRAM OXALATE 10 MG PO TABS
20.0000 mg | ORAL_TABLET | Freq: Every day | ORAL | Status: DC
  Administered 2015-12-02: 20 mg via ORAL
  Filled 2015-12-01: qty 2

## 2015-12-01 MED ORDER — ONDANSETRON HCL 4 MG/2ML IJ SOLN
INTRAMUSCULAR | Status: AC
  Filled 2015-12-01: qty 4

## 2015-12-01 MED ORDER — MIDAZOLAM HCL 2 MG/2ML IJ SOLN
INTRAMUSCULAR | Status: AC
  Filled 2015-12-01: qty 2

## 2015-12-01 MED ORDER — EMPAGLIFLOZIN-LINAGLIPTIN 25-5 MG PO TABS: 1.0000 | ORAL_TABLET | Freq: Every day | ORAL | Status: DC

## 2015-12-01 MED ORDER — ZOLPIDEM TARTRATE 5 MG PO TABS: 5.0000 mg | ORAL_TABLET | Freq: Every evening | ORAL | Status: DC | PRN

## 2015-12-01 MED ORDER — BUPIVACAINE LIPOSOME 1.3 % IJ SUSP
INTRAMUSCULAR | Status: DC | PRN
  Administered 2015-12-01: 20 mL

## 2015-12-01 MED ORDER — TRANEXAMIC ACID 1000 MG/10ML IV SOLN
1000.0000 mg | Freq: Once | INTRAVENOUS | Status: AC
  Administered 2015-12-01: 1000 mg via INTRAVENOUS
  Filled 2015-12-01: qty 10

## 2015-12-01 MED ORDER — ACETAMINOPHEN 650 MG RE SUPP: 650.0000 mg | Freq: Four times a day (QID) | RECTAL | Status: DC | PRN

## 2015-12-01 MED ORDER — DEXAMETHASONE SODIUM PHOSPHATE 10 MG/ML IJ SOLN
INTRAMUSCULAR | Status: AC
  Filled 2015-12-01: qty 1

## 2015-12-01 MED ORDER — ACETAMINOPHEN 325 MG PO TABS
650.0000 mg | ORAL_TABLET | Freq: Four times a day (QID) | ORAL | Status: DC | PRN
Start: 2015-12-01 — End: 2015-12-02

## 2015-12-01 MED ORDER — MENTHOL 3 MG MT LOZG: 1.0000 | LOZENGE | OROMUCOSAL | Status: DC | PRN

## 2015-12-01 MED ORDER — MIDAZOLAM HCL 5 MG/5ML IJ SOLN
INTRAMUSCULAR | Status: DC | PRN
  Administered 2015-12-01: 2 mg via INTRAVENOUS

## 2015-12-01 MED ORDER — DIVALPROEX SODIUM ER 500 MG PO TB24
500.0000 mg | ORAL_TABLET | Freq: Every day | ORAL | Status: DC
  Administered 2015-12-01 – 2015-12-02 (×2): 500 mg via ORAL
  Filled 2015-12-01 (×2): qty 1

## 2015-12-01 MED ORDER — METHOCARBAMOL 500 MG PO TABS
500.0000 mg | ORAL_TABLET | Freq: Four times a day (QID) | ORAL | Status: DC | PRN
  Administered 2015-12-01 – 2015-12-02 (×3): 500 mg via ORAL
  Filled 2015-12-01 (×4): qty 1

## 2015-12-01 MED ORDER — PROPOFOL 500 MG/50ML IV EMUL
INTRAVENOUS | Status: DC | PRN
  Administered 2015-12-01: 75 ug/kg/min via INTRAVENOUS

## 2015-12-01 MED ORDER — FENTANYL CITRATE (PF) 100 MCG/2ML IJ SOLN
INTRAMUSCULAR | Status: AC
  Filled 2015-12-01: qty 2

## 2015-12-01 MED ORDER — CELECOXIB 200 MG PO CAPS
200.0000 mg | ORAL_CAPSULE | Freq: Two times a day (BID) | ORAL | Status: DC
  Administered 2015-12-01: 200 mg via ORAL
  Filled 2015-12-01 (×2): qty 1

## 2015-12-01 MED ORDER — GABAPENTIN 300 MG PO CAPS
300.0000 mg | ORAL_CAPSULE | Freq: Three times a day (TID) | ORAL | Status: DC
  Administered 2015-12-01 – 2015-12-02 (×3): 300 mg via ORAL
  Filled 2015-12-01 (×3): qty 1

## 2015-12-01 MED ORDER — MIDAZOLAM HCL 2 MG/2ML IJ SOLN
INTRAMUSCULAR | Status: AC
Start: 2015-12-01 — End: 2015-12-01
  Filled 2015-12-01: qty 2

## 2015-12-01 MED ORDER — CANAGLIFLOZIN 100 MG PO TABS
100.0000 mg | ORAL_TABLET | Freq: Every day | ORAL | Status: DC
  Administered 2015-12-02: 100 mg via ORAL
  Filled 2015-12-01: qty 1

## 2015-12-01 MED ORDER — METOCLOPRAMIDE HCL 5 MG PO TABS: 5.0000 mg | ORAL_TABLET | Freq: Three times a day (TID) | ORAL | Status: DC | PRN

## 2015-12-01 MED ORDER — LACTATED RINGERS IV SOLN: INTRAVENOUS | Status: DC

## 2015-12-01 MED ORDER — FENOFIBRATE 54 MG PO TABS
54.0000 mg | ORAL_TABLET | Freq: Every day | ORAL | Status: DC
  Administered 2015-12-02: 54 mg via ORAL
  Filled 2015-12-01: qty 1

## 2015-12-01 MED ORDER — PRAVASTATIN SODIUM 40 MG PO TABS
80.0000 mg | ORAL_TABLET | Freq: Every day | ORAL | Status: DC
  Administered 2015-12-01: 80 mg via ORAL
  Filled 2015-12-01 (×2): qty 2

## 2015-12-01 MED ORDER — FENTANYL CITRATE (PF) 100 MCG/2ML IJ SOLN
50.0000 ug | Freq: Once | INTRAMUSCULAR | Status: AC
  Administered 2015-12-01: 50 ug via INTRAVENOUS

## 2015-12-01 MED ORDER — ALUM & MAG HYDROXIDE-SIMETH 200-200-20 MG/5ML PO SUSP: 30.0000 mL | ORAL | Status: DC | PRN

## 2015-12-01 MED ORDER — INSULIN ASPART 100 UNIT/ML ~~LOC~~ SOLN
0.0000 [IU] | Freq: Three times a day (TID) | SUBCUTANEOUS | Status: DC
  Administered 2015-12-01 – 2015-12-02 (×2): 3 [IU] via SUBCUTANEOUS

## 2015-12-01 MED ORDER — BUPIVACAINE LIPOSOME 1.3 % IJ SUSP
20.0000 mL | INTRAMUSCULAR | Status: DC
  Filled 2015-12-01: qty 20

## 2015-12-01 MED ORDER — OXYCODONE HCL ER 10 MG PO T12A
10.0000 mg | EXTENDED_RELEASE_TABLET | Freq: Two times a day (BID) | ORAL | Status: DC
  Administered 2015-12-01: 10 mg via ORAL
  Filled 2015-12-01 (×2): qty 1

## 2015-12-01 MED ORDER — LINAGLIPTIN 5 MG PO TABS
5.0000 mg | ORAL_TABLET | Freq: Every day | ORAL | Status: DC
  Administered 2015-12-02: 5 mg via ORAL
  Filled 2015-12-01: qty 1

## 2015-12-01 MED ORDER — ENOXAPARIN SODIUM 30 MG/0.3ML ~~LOC~~ SOLN
30.0000 mg | Freq: Two times a day (BID) | SUBCUTANEOUS | Status: DC
  Administered 2015-12-02: 30 mg via SUBCUTANEOUS
  Filled 2015-12-01: qty 0.3

## 2015-12-01 MED ORDER — HYDROMORPHONE HCL 1 MG/ML IJ SOLN
1.0000 mg | INTRAMUSCULAR | Status: DC | PRN
  Administered 2015-12-01 – 2015-12-02 (×4): 1 mg via INTRAVENOUS
  Filled 2015-12-01 (×4): qty 1

## 2015-12-01 SURGICAL SUPPLY — 64 items
BANDAGE ESMARK 6X9 LF (GAUZE/BANDAGES/DRESSINGS) ×1 IMPLANT
BLADE SAGITTAL 13X1.27X60 (BLADE) ×2 IMPLANT
BLADE SAGITTAL 13X1.27X60MM (BLADE) ×1
BLADE SAW SGTL 83.5X18.5 (BLADE) ×3 IMPLANT
BLADE SURG 10 STRL SS (BLADE) ×3 IMPLANT
BNDG ESMARK 6X9 LF (GAUZE/BANDAGES/DRESSINGS) ×3
BOWL SMART MIX CTS (DISPOSABLE) ×3 IMPLANT
CAPT KNEE TOTAL 3 ×3 IMPLANT
CEMENT BONE SIMPLEX SPEEDSET (Cement) ×6 IMPLANT
COVER SURGICAL LIGHT HANDLE (MISCELLANEOUS) ×3 IMPLANT
CUFF TOURNIQUET SINGLE 34IN LL (TOURNIQUET CUFF) ×3 IMPLANT
DRAPE EXTREMITY T 121X128X90 (DRAPE) ×3 IMPLANT
DRAPE INCISE IOBAN 66X45 STRL (DRAPES) ×6 IMPLANT
DRAPE PROXIMA HALF (DRAPES) IMPLANT
DRAPE U-SHAPE 47X51 STRL (DRAPES) ×3 IMPLANT
DRSG ADAPTIC 3X8 NADH LF (GAUZE/BANDAGES/DRESSINGS) ×3 IMPLANT
DRSG PAD ABDOMINAL 8X10 ST (GAUZE/BANDAGES/DRESSINGS) ×6 IMPLANT
DURAPREP 26ML APPLICATOR (WOUND CARE) ×6 IMPLANT
ELECT REM PT RETURN 9FT ADLT (ELECTROSURGICAL) ×3
ELECTRODE REM PT RTRN 9FT ADLT (ELECTROSURGICAL) ×1 IMPLANT
GAUZE SPONGE 4X4 12PLY STRL (GAUZE/BANDAGES/DRESSINGS) ×3 IMPLANT
GAUZE XEROFORM 5X9 LF (GAUZE/BANDAGES/DRESSINGS) ×3 IMPLANT
GLOVE BIOGEL M 7.0 STRL (GLOVE) IMPLANT
GLOVE BIOGEL PI IND STRL 7.5 (GLOVE) IMPLANT
GLOVE BIOGEL PI IND STRL 8.5 (GLOVE) ×5 IMPLANT
GLOVE BIOGEL PI INDICATOR 7.5 (GLOVE)
GLOVE BIOGEL PI INDICATOR 8.5 (GLOVE) ×10
GLOVE SURG ORTHO 8.0 STRL STRW (GLOVE) ×18 IMPLANT
GOWN STRL REUS W/ TWL LRG LVL3 (GOWN DISPOSABLE) ×1 IMPLANT
GOWN STRL REUS W/ TWL XL LVL3 (GOWN DISPOSABLE) ×2 IMPLANT
GOWN STRL REUS W/TWL 2XL LVL3 (GOWN DISPOSABLE) ×3 IMPLANT
GOWN STRL REUS W/TWL LRG LVL3 (GOWN DISPOSABLE) ×2
GOWN STRL REUS W/TWL XL LVL3 (GOWN DISPOSABLE) ×4
HANDPIECE INTERPULSE COAX TIP (DISPOSABLE) ×2
HOOD PEEL AWAY FACE SHEILD DIS (HOOD) ×9 IMPLANT
KIT BASIN OR (CUSTOM PROCEDURE TRAY) ×3 IMPLANT
KIT ROOM TURNOVER OR (KITS) ×3 IMPLANT
KNEE CAPITATED TOTAL 3 ×1 IMPLANT
MANIFOLD NEPTUNE II (INSTRUMENTS) ×3 IMPLANT
NEEDLE 22X1 1/2 (OR ONLY) (NEEDLE) ×6 IMPLANT
NS IRRIG 1000ML POUR BTL (IV SOLUTION) ×3 IMPLANT
PACK TOTAL JOINT (CUSTOM PROCEDURE TRAY) ×3 IMPLANT
PACK UNIVERSAL I (CUSTOM PROCEDURE TRAY) ×3 IMPLANT
PAD ARMBOARD 7.5X6 YLW CONV (MISCELLANEOUS) ×6 IMPLANT
PADDING CAST ABS 6INX4YD NS (CAST SUPPLIES) ×2
PADDING CAST ABS COTTON 6X4 NS (CAST SUPPLIES) ×1 IMPLANT
PADDING CAST COTTON 6X4 STRL (CAST SUPPLIES) ×3 IMPLANT
SET HNDPC FAN SPRY TIP SCT (DISPOSABLE) ×1 IMPLANT
SPONGE GAUZE 4X4 12PLY STER LF (GAUZE/BANDAGES/DRESSINGS) ×3 IMPLANT
STAPLER VISISTAT 35W (STAPLE) ×3 IMPLANT
SUCTION FRAZIER HANDLE 10FR (MISCELLANEOUS) ×2
SUCTION TUBE FRAZIER 10FR DISP (MISCELLANEOUS) ×1 IMPLANT
SUT BONE WAX W31G (SUTURE) ×3 IMPLANT
SUT VIC AB 0 CT1 27 (SUTURE) ×4
SUT VIC AB 0 CT1 27XBRD ANBCTR (SUTURE) ×2 IMPLANT
SUT VIC AB 0 CTB1 27 (SUTURE) ×6 IMPLANT
SUT VIC AB 1 CT1 27 (SUTURE) ×4
SUT VIC AB 1 CT1 27XBRD ANBCTR (SUTURE) ×2 IMPLANT
SUT VIC AB 2-0 CT1 27 (SUTURE) ×8
SUT VIC AB 2-0 CT1 TAPERPNT 27 (SUTURE) ×4 IMPLANT
SYR 20CC LL (SYRINGE) ×6 IMPLANT
TOWEL OR 17X24 6PK STRL BLUE (TOWEL DISPOSABLE) ×3 IMPLANT
TOWEL OR 17X26 10 PK STRL BLUE (TOWEL DISPOSABLE) ×3 IMPLANT
WATER STERILE IRR 1000ML POUR (IV SOLUTION) ×6 IMPLANT

## 2015-12-01 NOTE — Transfer of Care (Signed)
Immediate Anesthesia Transfer of Care Note  Patient: Jared Wood  Procedure(s) Performed: Procedure(s): TOTAL KNEE ARTHROPLASTY (Right)  Patient Location: PACU  Anesthesia Type:MAC and Spinal  Level of Consciousness: awake, alert , oriented and sedated  Airway & Oxygen Therapy: Patient Spontanous Breathing and Patient connected to nasal cannula oxygen  Post-op Assessment: Report given to RN, Post -op Vital signs reviewed and stable and Patient moving all extremities  Post vital signs: Reviewed and stable  Last Vitals:  Filed Vitals:   12/01/15 0940 12/01/15 0950  BP: 113/55 112/63  Pulse: 67 72  Temp:    Resp: 16 15    Last Pain:  Filed Vitals:   12/01/15 0953  PainSc: 2       Patients Stated Pain Goal: 4 ([id number])  Complications: No apparent anesthesia complications

## 2015-12-01 NOTE — Anesthesia Preprocedure Evaluation (Addendum)
Anesthesia Evaluation  Patient identified by MRN, date of birth, ID band Patient awake    Reviewed: Allergy & Precautions, NPO status , Patient's Chart, lab work & pertinent test results  History of Anesthesia Complications (+) PONV and history of anesthetic complications  Airway Mallampati: II  TM Distance: >3 FB Neck ROM: Full    Dental  (+) Teeth Intact   Pulmonary former smoker,    breath sounds clear to auscultation       Cardiovascular hypertension, Pt. on medications  Rhythm:Regular Rate:Normal     Neuro/Psych  Headaches, PSYCHIATRIC DISORDERS    GI/Hepatic GERD  Medicated,  Endo/Other  diabetes, Type 2, Oral Hypoglycemic Agents  Renal/GU   negative genitourinary   Musculoskeletal  (+) Arthritis ,   Abdominal   Peds negative pediatric ROS (+)  Hematology negative hematology ROS (+)   Anesthesia Other Findings   Reproductive/Obstetrics negative OB ROS                           Anesthesia Physical Anesthesia Plan  ASA: III  Anesthesia Plan: Spinal   Post-op Pain Management:  Regional for Post-op pain   Induction: Intravenous  Airway Management Planned: Simple Face Mask  Additional Equipment:   Intra-op Plan:   Post-operative Plan:   Informed Consent: I have reviewed the patients History and Physical, chart, labs and discussed the procedure including the risks, benefits and alternatives for the proposed anesthesia with the patient or authorized representative who has indicated his/her understanding and acceptance.   Dental advisory given  Plan Discussed with: CRNA  Anesthesia Plan Comments:        Anesthesia Quick Evaluation

## 2015-12-01 NOTE — Progress Notes (Signed)
Orthopedic Tech Progress Note Patient Details:  Jared Wood [birth year] [id number]  CPM Right Knee CPM Right Knee: On Right Knee Flexion (Degrees): 90 Right Knee Extension (Degrees): 0 Additional Comments: Trapeze bar and foot roll   Jared Wood 12/01/2015, 3:06 PM

## 2015-12-01 NOTE — H&P (Signed)
Jared Wood MRN:  [id number] DOB/SEX:  Jun 19, 1966/male  CHIEF COMPLAINT:  Painful right Knee  HISTORY: Patient is a 50 y.o. male presented with a history of pain in the right knee. Onset of symptoms was gradual starting a few years ago with gradually worsening course since that time. Patient has been treated conservatively with over-the-counter NSAIDs and activity modification. Patient currently rates pain in the knee at 10 out of 10 with activity. There is pain at night.  PAST MEDICAL HISTORY: There are no active problems to display for this patient.  Past Medical History  Diagnosis Date  . Hyperlipemia   . Diabetes mellitus without complication (HCC)     Type 2  . PONV (postoperative nausea and vomiting)     Nausea  . Hypertension   . Conversion disorder   . Hemiplegic migraine   . History of kidney stones   . GERD (gastroesophageal reflux disease)   . Arthritis   . History of bleeding ulcers   . Left-sided weakness    Past Surgical History  Procedure Laterality Date  . Tonsillectomy      age 62  . Appendectomy    . Cholecystectomy    . Shoulder arthroscopy with open rotator cuff repair Right   . Hernia repair    . Hand surgery Left     hand reconstruction  . Colonoscopy w/ endoscopic [place]    . Coronary angioplasty    . Lithotripsy       MEDICATIONS:   No prescriptions prior to admission    ALLERGIES:   Allergies  Allergen Reactions  . Demerol [Meperidine] Anaphylaxis    REVIEW OF SYSTEMS:  A comprehensive review of systems was negative except for: Musculoskeletal: positive for arthralgias and stiff joints   FAMILY HISTORY:  No family history on file.  SOCIAL HISTORY:   Social History  Substance Use Topics  . Smoking status: Former Research scientist (life sciences)  . Smokeless tobacco: Not on file  . Alcohol Use: Yes     Comment: social     EXAMINATION:  Vital signs in last 24 hours:    There were no vitals taken for this visit.  General Appearance:    Alert, cooperative,  no distress, appears stated age  Head:    Normocephalic, without obvious abnormality, atraumatic  Eyes:    PERRL, conjunctiva/corneas clear, EOM's intact, fundi    benign, both eyes       Ears:    Normal TM's and external ear canals, both ears  Nose:   Nares normal, septum midline, mucosa normal, no drainage    or sinus tenderness  Throat:   Lips, mucosa, and tongue normal; teeth and gums normal  Neck:   Supple, symmetrical, trachea midline, no adenopathy;       thyroid:  No enlargement/tenderness/nodules; no carotid   bruit or JVD  Back:     Symmetric, no curvature, ROM normal, no CVA tenderness  Lungs:     Clear to auscultation bilaterally, respirations unlabored  Chest wall:    No tenderness or deformity  Heart:    Regular rate and rhythm, S1 and S2 normal, no murmur, rub   or gallop  Abdomen:     Soft, non-tender, bowel sounds active all four quadrants,    no masses, no organomegaly  Genitalia:    Normal male without lesion, discharge or tenderness  Rectal:    Normal tone, normal prostate, no masses or tenderness;   guaiac negative stool  Extremities:   Extremities normal, atraumatic, no  cyanosis or edema  Pulses:   2+ and symmetric all extremities  Skin:   Skin color, texture, turgor normal, no rashes or lesions  Lymph nodes:   Cervical, supraclavicular, and axillary nodes normal  Neurologic:   CNII-XII intact. Normal strength, sensation and reflexes      throughout    Musculoskeletal:  ROM 0-120, Ligaments intact,  Imaging Review Plain radiographs demonstrate severe degenerative joint disease of the right knee. The overall alignment is neutral. The bone quality appears to be excellent for age and reported activity level.  Assessment/Plan: Primary osteoarthritis, right knee   The patient history, physical examination and imaging studies are consistent with advanced degenerative joint disease of the right knee. The patient has failed conservative treatment.  The clearance notes  were reviewed.  After discussion with the patient it was felt that Total Knee Replacement was indicated. The procedure,  risks, and benefits of total knee arthroplasty were presented and reviewed. The risks including but not limited to aseptic loosening, infection, blood clots, vascular injury, stiffness, patella tracking problems complications among others were discussed. The patient acknowledged the explanation, agreed to proceed with the plan.  Donia Ast 12/01/2015, 6:40 AM

## 2015-12-01 NOTE — Anesthesia Procedure Notes (Addendum)
Anesthesia Regional Block:  Adductor canal block  Pre-Anesthetic Checklist: ,, timeout performed, Correct Patient, Correct Site, Correct Laterality, Correct Procedure, Correct Position, site marked, Risks and benefits discussed,  Surgical consent,  Pre-op evaluation,  At surgeon's request and post-op pain management  Laterality: Right  Prep: chloraprep       Needles:  Injection technique: Single-shot  Needle Type: Echogenic Needle     Needle Length: 9cm 9 cm Needle Gauge: 21 and 21 G    Additional Needles:  Procedures: ultrasound guided (picture in chart) Adductor canal block Narrative:  Start time: 12/01/2015 9:46 AM End time: 12/01/2015 9:48 AM Injection made incrementally with aspirations every 5 mL.  Performed by: Personally  Anesthesiologist: Suella Broad D  Additional Notes: Pt tolerated well. No immediate complications noted.    Spinal Patient location during procedure: OR Staffing Anesthesiologist: Ireland Virrueta Performed by: anesthesiologist  Preanesthetic Checklist Completed: patient identified, site marked, surgical consent, pre-op evaluation, timeout performed, IV checked, risks and benefits discussed and monitors and equipment checked Spinal Block Patient position: sitting Prep: ChloraPrep Patient monitoring: continuous pulse ox, blood pressure and heart rate Approach: midline Injection technique: single-shot Needle Needle type: Sprotte  Needle gauge: 24 G Needle length: 9 cm Additional Notes Functioning IV was confirmed and monitors were applied. Sterile prep and drape, including hand hygiene, mask and sterile gloves were used. The patient was positioned and the spine was prepped. The skin was anesthetized with lidocaine.  Free flow of clear CSF was obtained prior to injecting local anesthetic into the CSF.  The spinal needle aspirated freely following injection.  The needle was carefully withdrawn.  The patient tolerated the procedure well. Consent was  obtained prior to procedure with all questions answered and concerns addressed. Risks including but not limited to bleeding, infection, nerve damage, paralysis, failed block, inadequate analgesia, allergic reaction, high spinal, itching and headache were discussed and the patient wished to proceed.   Lauretta Grill, MD

## 2015-12-01 NOTE — Progress Notes (Signed)
[place] patient's insurance users to set up home health, requested HHPT with start date 12/03/15. Reference  # [id number]. Faxed HHPT order and H and P to [phone]. Will continue to follow.

## 2015-12-02 ENCOUNTER — Encounter (HOSPITAL_COMMUNITY): Payer: Self-pay | Admitting: Orthopedic Surgery

## 2015-12-02 LAB — BASIC METABOLIC PANEL
Anion gap: 14 (ref 5–15)
BUN: 11 mg/dL (ref 6–20)
CHLORIDE: 104 mmol/L (ref 101–111)
CO2: 23 mmol/L (ref 22–32)
CREATININE: 0.94 mg/dL (ref 0.61–1.24)
Calcium: 9.3 mg/dL (ref 8.9–10.3)
Glucose, Bld: 182 mg/dL — ABNORMAL HIGH (ref 65–99)
Potassium: 4.1 mmol/L (ref 3.5–5.1)
SODIUM: 141 mmol/L (ref 135–145)

## 2015-12-02 LAB — CBC
HCT: 38.8 % — ABNORMAL LOW (ref 39.0–52.0)
HEMOGLOBIN: 12.8 g/dL — AB (ref 13.0–17.0)
MCH: 27.5 pg (ref 26.0–34.0)
MCHC: 33 g/dL (ref 30.0–36.0)
MCV: 83.3 fL (ref 78.0–100.0)
PLATELETS: 299 10*3/uL (ref 150–400)
RBC: 4.66 MIL/uL (ref 4.22–5.81)
RDW: 13.4 % (ref 11.5–15.5)
WBC: 12.5 10*3/uL — ABNORMAL HIGH (ref 4.0–10.5)

## 2015-12-02 LAB — GLUCOSE, CAPILLARY
GLUCOSE-CAPILLARY: 195 mg/dL — AB (ref 65–99)
Glucose-Capillary: 175 mg/dL — ABNORMAL HIGH (ref 65–99)

## 2015-12-02 MED ORDER — ONDANSETRON HCL 4 MG PO TABS: 4.0000 mg | ORAL_TABLET | Freq: Four times a day (QID) | ORAL | Status: DC | PRN

## 2015-12-02 MED ORDER — OXYCODONE HCL 5 MG PO TABS: 5.0000 mg | ORAL_TABLET | ORAL | Status: DC | PRN

## 2015-12-02 MED ORDER — METHOCARBAMOL 500 MG PO TABS: 500.0000 mg | ORAL_TABLET | Freq: Four times a day (QID) | ORAL | Status: DC | PRN

## 2015-12-02 MED ORDER — OXYCODONE HCL ER 10 MG PO T12A: 10.0000 mg | EXTENDED_RELEASE_TABLET | Freq: Two times a day (BID) | ORAL | Status: DC

## 2015-12-02 MED ORDER — ENOXAPARIN SODIUM 40 MG/0.4ML ~~LOC~~ SOLN: 40.0000 mg | SUBCUTANEOUS | Status: DC

## 2015-12-02 NOTE — Care Management Note (Signed)
Case Management Note  Patient Details  Name: Jared Wood MRN: [id number] Date of Birth: Apr 10, 1966  Subjective/Objective:             S/p right total knee arthroplasty       Action/Plan: Set up with outpatient PT for 12/04/15 by MD office. [facility] to deliver CPM to patient's home, already had rolling walker and 3N1. Wife will be assisting patient after discharge.  Expected Discharge Date:                  Expected Discharge Plan:  Home/Self Care  In-House Referral:  NA  Discharge planning Services  CM Consult  Post Acute Care Choice:  Durable Medical Equipment Choice offered to:     DME Arranged:  CPM DME Agency:  [facility]  HH Arranged:  NA HH Agency:     Status of Service:  Completed, signed off  Medicare Important Message Given:    Date Medicare IM Given:    Medicare IM give by:    Date Additional Medicare IM Given:    Additional Medicare Important Message give by:     If discussed at [place] of Stay Meetings, dates discussed:    Additional Comments:  [name], RN 12/02/2015, 1:09 PM

## 2015-12-02 NOTE — Op Note (Addendum)
TOTAL KNEE REPLACEMENT OPERATIVE NOTE:  12/01/2015  5:55 PM  PATIENT:  Jared Wood  50 y.o. male  PRE-OPERATIVE DIAGNOSIS:  primary osteoarthritis right knee  POST-OPERATIVE DIAGNOSIS:  primary osteoarthritis right knee  PROCEDURE:  Procedure(s): TOTAL KNEE ARTHROPLASTY  SURGEON:  Surgeon(s): Vickey Huger, MD  PHYSICIAN ASSISTANT: Buck Mam, PAC   ANESTHESIA:   spinal  DRAINS: Hemovac  SPECIMEN: None  COUNTS:  Correct  TOURNIQUET:   Total Tourniquet Time Documented: Thigh (Right) - [id number] minutes Total: Thigh (Right) - [id number] minutes   DICTATION:  Indication for procedure:    The patient is a 50 y.o. male who has failed conservative treatment for primary osteoarthritis right knee.  Informed consent was obtained prior to anesthesia. The risks versus benefits of the operation were explain and in a way the patient can, and did, understand.   On the implant demand matching protocol, this patient scored 10.  Therefore, this patient did" "did not receive a polyethylene insert with vitamin E which is a high demand implant.  Description of procedure:     The patient was taken to the operating room and placed under anesthesia.  The patient was positioned in the usual fashion taking care that all body parts were adequately padded and/or protected.  I foley catheter was not placed.  A tourniquet was applied and the leg prepped and draped in the usual sterile fashion.  The extremity was exsanguinated with the esmarch and tourniquet inflated to 350 mmHg.  Pre-operative range of motion was normal.  The knee was in 5 degree of mild varus.  A midline incision approximately 6-7 inches long was made with a #10 blade.  A new blade was used to make a parapatellar arthrotomy going 2-3 cm into the quadriceps tendon, over the patella, and alongside the medial aspect of the patellar tendon.  A synovectomy was then performed with the #10 blade and forceps. I then elevated the deep MCL off  the medial tibial metaphysis subperiosteally around to the semimembranosus attachment.    I everted the patella and used calipers to measure patellar thickness.  I used the reamer to ream down to appropriate thickness to recreate the native thickness.  I then removed excess bone with the rongeur and sagittal saw.  I used the appropriately sized template and drilled the three lug holes.  I then put the trial in place and measured the thickness with the calipers to ensure recreation of the native thickness.  The trial was then removed and the patella subluxed and the knee brought into flexion.  A homan retractor was place to retract and protect the patella and lateral structures.  A Z-retractor was place medially to protect the medial structures.  The extra-medullary alignment system was used to make cut the tibial articular surface perpendicular to the anamotic axis of the tibia and in 3 degrees of posterior slope.  The cut surface and alignment jig was removed.  I then used the intramedullary alignment guide to make a 6 valgus cut on the distal femur.  I then marked out the epicondylar axis on the distal femur.  The posterior condylar axis measured 3 degrees.  I then used the anterior referencing sizer and measured the femur to be a size 8.  The 4-In-1 cutting block was screwed into place in external rotation matching the posterior condylar angle, making our cuts perpendicular to the epicondylar axis.  Anterior, posterior and chamfer cuts were made with the sagittal saw.  The cutting block and cut  pieces were removed.  A lamina spreader was placed in 90 degrees of flexion.  The ACL, PCL, menisci, and posterior condylar osteophytes were removed.  A 10 mm spacer blocked was found to offer good flexion and extension gap balance after minimal in degree releasing.   The scoop retractor was then placed and the femoral finishing block was pinned in place.  The small sagittal saw was used as well as the lug drill to  finish the femur.  The block and cut surfaces were removed and the medullary canal hole filled with autograft bone from the cut pieces.  The tibia was delivered forward in deep flexion and external rotation.  A size E tray was selected and pinned into place centered on the medial 1/3 of the tibial tubercle.  The reamer and keel was used to prepare the tibia through the tray.    I then trialed with the size 8 femur, size E tibia, a 10 mm insert and the 35 patella.  I had excellent flexion/extension gap balance, excellent patella tracking.  Flexion was full and beyond 120 degrees; extension was zero.  These components were chosen and the staff opened them to me on the back table while the knee was lavaged copiously and the cement mixed.  The soft tissue was infiltrated with 60cc of exparel 1.3% through a 21 gauge needle.  I cemented in the components and removed all excess cement.  The polyethylene tibial component was snapped into place and the knee placed in extension while cement was hardening.  The capsule was infilltrated with 30cc of .25% Marcaine with epinephrine.  A hemovac was place in the joint exiting superolaterally.  A pain pump was place superomedially superficial to the arthrotomy.  Once the cement was hard, the tourniquet was let down.  Hemostasis was obtained.  The arthrotomy was closed with figure-8 #1 vicryl sutures.  The deep soft tissues were closed with #0 vicryls and the subcuticular layer closed with a running #2-0 vicryl.  The skin was reapproximated and closed with skin staples.  The wound was dressed with xeroform, 4 x4's, 2 ABD sponges, a single layer of webril and a TED stocking.   The patient was then awakened, extubated, and taken to the recovery room in stable condition.  BLOOD LOSS:  300cc DRAINS: 1 hemovac, 1 pain catheter COMPLICATIONS:  None.  PLAN OF CARE: Admit to inpatient   PATIENT DISPOSITION:  PACU - hemodynamically stable.   Delay start of Pharmacological  VTE agent (>24hrs) due to surgical blood loss or risk of bleeding:  not applicable  Please fax a copy of this op note to my office at [phone] (please only include page 1 and 2 of the Case Information op note)

## 2015-12-02 NOTE — Progress Notes (Signed)
Discharge instructions given. Pt verbalized understanding and all questions were answered.  

## 2015-12-02 NOTE — Progress Notes (Signed)
SPORTS MEDICINE AND JOINT REPLACEMENT  Lara Mulch, MD   [facility] [place], [place],   [address]                             [phone]   PROGRESS NOTE  Subjective:  negative for Chest Pain  negative for Shortness of Breath  negative for Nausea/Vomiting   negative for Calf Pain  negative for Bowel Movement   Tolerating Diet: yes         Patient reports pain as 7 on 0-10 scale.    Objective: Vital signs in last 24 hours:   Patient Vitals for the past 24 hrs:  BP Temp Temp src Pulse Resp SpO2 Height Weight  12/02/15 0330 (!) 120/100 mmHg 98.2 F (36.8 C) Oral 78 16 97 % - -  12/02/15 0025 124/68 mmHg 98 F (36.7 C) Oral 82 16 97 % - -  12/01/15 1958 (!) 126/57 mmHg 98 F (36.7 C) Oral 84 16 94 % - -  12/01/15 1538 (!) 119/58 mmHg 98.7 F (37.1 C) Oral 72 14 99 % - -  12/01/15 1500 128/74 mmHg - - 67 11 97 % - -  12/01/15 1445 - - - 67 15 95 % - -  12/01/15 1430 109/61 mmHg - - 66 17 94 % - -  12/01/15 1415 122/62 mmHg - - 70 14 97 % - -  12/01/15 1414 122/62 mmHg 97.6 F (36.4 C) - 67 13 94 % - -  12/01/15 0950 112/63 mmHg - - 72 15 99 % - -  12/01/15 0940 (!) 113/55 mmHg - - 67 16 99 % - -  12/01/15 0930 (!) 115/56 mmHg - - 69 16 98 % - -  12/01/15 0918 121/68 mmHg 98.1 F (36.7 C) Oral 67 20 98 % 5\' 9"  (1.753 m) 121.201 kg (267 lb 3.2 oz)    @flow {1959:LAST@   Intake/Output from previous day:   05/01 0701 - 05/02 0700 In: 2615 [P.O.:480; I.V.:1925] Out: 2675 [Urine:2575]   Intake/Output this shift:       Intake/Output      05/01 0701 - 05/02 0700 05/02 0701 - 05/03 0700   P.O. 480    I.V. (mL/kg) 1925 (15.9)    IV Piggyback 210    Total Intake(mL/kg) 2615 (21.6)    Urine (mL/kg/hr) 2575    Blood 100    Total Output 2675     Net -60             LABORATORY DATA:  Recent Labs  12/01/15 1543 12/02/15 0331  WBC 5.7 12.5*  HGB 13.0 12.8*  HCT 39.6 38.8*  PLT 248 299    Recent Labs  12/01/15 1543 12/02/15 0331   NA  --  141  K  --  4.1  CL  --  104  CO2  --  23  BUN  --  11  CREATININE 0.97 0.94  GLUCOSE  --  182*  CALCIUM  --  9.3   Lab Results  Component Value Date   INR 1.11 11/21/2015    Examination:  General appearance: alert, cooperative and no distress Extremities: extremities normal, atraumatic, no cyanosis or edema  Wound Exam: clean, dry, intact   Drainage:  None: wound tissue dry  Motor Exam: Quadriceps and Hamstrings Intact  Sensory Exam: Superficial Peroneal, Deep Peroneal and Tibial normal   Assessment:    1 Day Post-Op  Procedure(s) (LRB): TOTAL KNEE ARTHROPLASTY (Right)  ADDITIONAL DIAGNOSIS:  Active Problems:   S/P total knee replacement  Acute Blood Loss Anemia   Plan: Physical Therapy as ordered Weight Bearing as Tolerated (WBAT)  DVT Prophylaxis:  Lovenox  DISCHARGE PLAN: Home  DISCHARGE NEEDS: HHPT   Expect D/C today once cleared from PT         Donia Ast 12/02/2015, 7:12 AM

## 2015-12-02 NOTE — Evaluation (Signed)
Physical Therapy Evaluation Patient Details Name: Jared Wood MRN: [id number] DOB: [birth year] Today's Date: 12/02/2015   History of Present Illness  Pt is a 50 y.o. 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 PMH: diabetes, hypertension, CVA (2016), Rt shoulder arthroscopy.   Clinical Impression  Pt is s/p TKA resulting in the deficits listed below (see PT Problem List).  Pt will benefit from skilled PT to increase their independence and safety with mobility to allow discharge to home with family assistance. Pt requesting to perform gait/stairs and exercises in single PT session. Pt hoping to get home as soon as possible. Will continue to follow acutely. Recommend HHPT at D/c.       Follow Up Recommendations Home health PT;Supervision for mobility/OOB    Equipment Recommendations  None recommended by PT    Recommendations for Other Services       Precautions / Restrictions Precautions Precautions: Knee;Fall Precaution Booklet Issued: Yes (comment) Precaution Comments: HEP provided, reviewed knee extension precautions Restrictions Weight Bearing Restrictions: Yes RLE Weight Bearing: Weight bearing as tolerated      Mobility  Bed Mobility Overal bed mobility: Needs Assistance Bed Mobility: Supine to Sit     Supine to sit: Supervision     General bed mobility comments: HOB elevated as per home level, no rail. Pt using LLE to hook and assist Rt LE.   Transfers Overall transfer level: Needs assistance Equipment used: Rolling walker (2 wheeled) Transfers: Sit to/from Stand Sit to Stand: Min guard         General transfer comment: performed from bed and mat levels.   Ambulation/Gait Ambulation/Gait assistance: Min guard Ambulation Distance (Feet): 200 Feet (100 ft X2) Assistive device: Rolling walker (2 wheeled) Gait Pattern/deviations: Step-through pattern;Decreased weight shift to right Gait velocity: decreased   General Gait Details: Encouraging knee flexion with swing phase.    Stairs Stairs: Yes Stairs assistance: Min assist Stair Management: Step to pattern;Forwards Number of Stairs: 2 General stair comments: Replicating home situation with pt pressing against rail to simulate wall and using HHA on opposite side. Pt reports feeling confident following session  Wheelchair Mobility    Modified Rankin (Stroke Patients Only)       Balance Overall balance assessment: Needs assistance Sitting-balance support: No upper extremity supported Sitting balance-Leahy Scale: Good     Standing balance support: During functional activity Standing balance-Leahy Scale: Fair Standing balance comment: using rw for ambulation.                             Pertinent Vitals/Pain Pain Assessment: 0-10 Pain Score: 4  Pain Location: Rt knee/thigh Pain Descriptors / Indicators: Aching Pain Intervention(s): Limited activity within patient's tolerance;Monitored during session    [place] expects to be discharged to:: Private residence Living Arrangements: Spouse/significant other Available Help at Discharge: Family;Available 24 hours/day Type of Home: House Home Access: Stairs to enter Entrance Stairs-Rails: None (wall on rt side) Entrance Stairs-Number of Steps: 2 Home Layout: One level Home Equipment: Walker - 2 wheels;Cane - single point      Prior Function Level of Independence: Independent with assistive device(s)         Comments: reports using SPC for ambulation PTA     Hand Dominance        Extremity/Trunk Assessment   Upper Extremity Assessment: Defer to OT evaluation           Lower Extremity Assessment: RLE deficits/detail RLE Deficits / Details:  unable to perform SLR independently.        Communication   Communication: No difficulties  Cognition Arousal/Alertness: Awake/alert Behavior During Therapy: WFL for tasks assessed/performed Overall Cognitive Status: Within Functional Limits for tasks  assessed                      General Comments General comments (skin integrity, edema, etc.): BP 133/80    Exercises Total Joint Exercises Ankle Circles/Pumps: AROM;Both;10 reps Quad Sets: Strengthening;Right;10 reps Short Arc Quad: Strengthening;Right;5 reps (min assist) Heel Slides: AAROM;Right;10 reps (assist) Hip ABduction/ADduction: Strengthening;Right;5 reps Straight Leg Raises: Strengthening;Right;5 reps (min assist) Long Arc Quad: Strengthening;Right;5 reps (mod assist) Knee Flexion: AROM;Right;5 reps;Seated Goniometric ROM: approximately 80 degrees knee flexion      Assessment/Plan    PT Assessment Patient needs continued PT services  PT Diagnosis Difficulty walking   PT Problem List Decreased strength;Decreased range of motion;Decreased activity tolerance;Decreased balance;Decreased mobility  PT Treatment Interventions DME instruction;Gait training;Stair training;Functional mobility training;Therapeutic exercise;Therapeutic activities;Patient/family education   PT Goals (Current goals can be found in the Care Plan section) Acute Rehab PT Goals Patient Stated Goal: get home as soon as possible PT Goal Formulation: With patient Time For Goal Achievement: 12/16/15 Potential to Achieve Goals: Good    Frequency 7X/week   Barriers to discharge        Co-evaluation               End of Session Equipment Utilized During Treatment: Gait belt Activity Tolerance: Patient tolerated treatment well Patient left: in chair;with call bell/phone within reach;Other (comment);with family/visitor present (in knee extension) Nurse Communication: Mobility status         Time: [id number] PT Time Calculation (min) (ACUTE ONLY): 61 min   Charges:   PT Evaluation $PT Eval Moderate Complexity: 1 Procedure PT Treatments $Gait Training: 23-37 mins $Therapeutic Exercise: 8-22 mins   PT G Codes:        Cassell Clement, PT, CSCS Pager [phone] Office [phone]  [phone]  12/02/2015, 10:04 AM

## 2015-12-02 NOTE — Progress Notes (Signed)
Occupational Therapy Evaluation Patient Details Name: Jared Wood MRN: [id number] DOB: 20-Jul-1966 Today's Date: 12/02/2015    History of Present Illness Pt is a 50 y.o. 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 PMH: diabetes, hypertension, CVA (2016), Rt shoulder arthroscopy.    Clinical Impression   Completed all education regarding compensatory techniques for ADL and safety for functional mobility for ADL Pt/wife verbalized understanding. Pt ready to D/C home when medically stable.     Follow Up Recommendations  No OT follow up;Supervision - Intermittent    Equipment Recommendations  None recommended by OT    Recommendations for Other Services       Precautions / Restrictions Precautions Precautions: Knee;Fall Precaution Booklet Issued: Yes (comment) Precaution Comments: HEP provided, reviewed knee extension precautions Restrictions Weight Bearing Restrictions: Yes RLE Weight Bearing: Weight bearing as tolerated      Mobility Bed Mobility      General bed mobility comments: OOB in chair  Transfers Overall transfer level: Needs assistance Equipment used: Rolling walker (2 wheeled) Transfers: Sit to/from [employer] Transfers Sit to Stand: Supervision Stand pivot transfers: Supervision       General transfer comment: educated on stepping backwards with strong leg first    Balance Overall balance assessment: Needs assistance Sitting-balance support: No upper extremity supported Sitting balance-Leahy Scale: Good     Standing balance support: During functional activity Standing balance-Leahy Scale: Fair Standing balance comment: using rw for ambulation.                            ADL Overall ADL's : Needs assistance/impaired                                     Functional mobility during ADLs: Supervision/safety;Rolling walker;Cueing for safety General ADL Comments: Completed education with pt/wife regarding compensatory technqieus  for ADL, use of DME and reducing risk of falls and home set up to maximize functional level of independence at home. Wife/Dad able to assist as needed x 2 weeks. Reviewed tub trasnfer technique. Pt/wife verbalied understanding.  Discussed importance of safe item transport to reduce risk of falls.     Vision     Perception     Praxis      Pertinent Vitals/Pain Pain Assessment: 0-10 Pain Score: 8  Pain Location: R knee Pain Descriptors / Indicators: Aching;Burning Pain Intervention(s): Limited activity within patient's tolerance;Ice applied;Repositioned;Patient requesting pain meds-RN notified     Hand Dominance     Extremity/Trunk Assessment Upper Extremity Assessment Upper Extremity Assessment: Overall WFL for tasks assessed   Lower Extremity Assessment Lower Extremity Assessment: Defer to PT evaluation RLE Deficits / Details: unable to perform SLR independently.    Cervical / Trunk Assessment Cervical / Trunk Assessment: Normal   Communication Communication Communication: No difficulties   Cognition Arousal/Alertness: Awake/alert Behavior During Therapy: WFL for tasks assessed/performed Overall Cognitive Status: Within Functional Limits for tasks assessed                     General Comments       Exercises Exercises: Total Joint     Shoulder Instructions      Home Living Family/patient expects to be discharged to:: Private residence Living Arrangements: Spouse/significant other Available Help at Discharge: Family;Available 24 hours/day Type of Home: House Home Access: Stairs to enter [employer] of Steps: 2 Entrance Stairs-Rails: None  Home Layout: One level     Bathroom Shower/Tub: Tub/shower unit Shower/tub characteristics: Architectural technologist: Standard Bathroom Accessibility: Yes How Accessible: Accessible via walker Home Equipment: [place] - 2 wheels;Cane - single point;Bedside commode;Tub bench;Toilet riser           Prior Functioning/Environment Level of Independence: Independent with assistive device(s)        Comments: reports using SPC for ambulation PTA    OT Diagnosis: Generalized weakness;Acute pain   OT Problem List: Decreased strength;Decreased range of motion;Decreased activity tolerance;Decreased safety awareness;Decreased knowledge of use of DME or AE;Decreased knowledge of precautions;Pain   OT Treatment/Interventions:      OT Goals(Current goals can be found in the care plan section) Acute Rehab OT Goals Patient Stated Goal: get home as soon as possible OT Goal Formulation: All assessment and education complete, DC therapy  OT Frequency:     Barriers to D/C:            Co-evaluation              End of Session CPM Right Knee CPM Right Knee: Off Nurse Communication: Mobility status  Activity Tolerance: Patient tolerated treatment well Patient left: in chair;with call bell/phone within reach;with family/visitor present   Time: [id number] OT Time Calculation (min): 14 min Charges:  OT Evaluation $OT Eval Low Complexity: 1 Procedure G-Codes:    Latasia Silberstein,HILLARY 01-Jan-2016, 11:58 AM   Maurie Boettcher, OTR/L  [phone] 2016/01/01

## 2015-12-02 NOTE — Progress Notes (Signed)
Utilization review completed.  

## 2015-12-02 NOTE — Discharge Summary (Signed)
SPORTS MEDICINE & JOINT REPLACEMENT   Lara Mulch, MD   Carlynn Spry, PA-C Carlyon Shadow, PA-C [place], [place], [place]  [id number]                             [phone]  PATIENT ID: Jared Wood        MRN:  [id number]          DOB/AGE: 1965-11-20 / 50 y.o.    DISCHARGE SUMMARY  ADMISSION DATE:    12/01/2015 DISCHARGE DATE:   12/02/2015   ADMISSION DIAGNOSIS: primary osteoarthritis right knee    DISCHARGE DIAGNOSIS:  primary osteoarthritis right knee    ADDITIONAL DIAGNOSIS: Active Problems:   S/P total knee replacement  Past Medical History  Diagnosis Date  . Hyperlipemia   . Diabetes mellitus without complication (HCC)     Type 2  . PONV (postoperative nausea and vomiting)     Nausea  . Hypertension   . Conversion disorder   . Hemiplegic migraine   . History of kidney stones   . GERD (gastroesophageal reflux disease)   . Arthritis   . History of bleeding ulcers   . Left-sided weakness   . Stroke ([facility]) 04/2015  . Hard of hearing     PROCEDURE: Procedure(s): TOTAL KNEE ARTHROPLASTY on 12/01/2015  CONSULTS:     HISTORY:  See H&P in chart  HOSPITAL COURSE:  Jared Wood is a 50 y.o. admitted on 12/01/2015 and found to have a diagnosis of primary osteoarthritis right knee.  After appropriate laboratory studies were obtained  they were taken to the operating room on 12/01/2015 and underwent Procedure(s): TOTAL KNEE ARTHROPLASTY.   They were given perioperative antibiotics:  Anti-infectives    Start     Dose/Rate Route Frequency Ordered Stop   12/01/15 1700  ceFAZolin (ANCEF) IVPB 1 g/50 mL premix     1 g 100 mL/hr over 30 Minutes Intravenous Every 6 hours 12/01/15 1527 12/02/15 0405   12/01/15 1045  ceFAZolin (ANCEF) 3 g in dextrose 5 % 50 mL IVPB     3 g 130 mL/hr over 30 Minutes Intravenous To Short Stay 11/30/15 1500 12/01/15 1158    .  Patient given tranexamic acid IV or topical and exparel intra-operatively.  Tolerated the procedure well.    POD#  1: Vital signs were stable.  Patient denied Chest pain, shortness of breath, or calf pain.  Patient was started on Lovenox 30 mg subcutaneously twice daily at 8am.  Consults to PT, OT, and care management were made.  The patient was weight bearing as tolerated.  CPM was placed on the operative leg 0-90 degrees for 6-8 hours a day. When out of the CPM, patient was placed in the foam block to achieve full extension. Incentive spirometry was taught.  Dressing was changed.       POD #2, Continued  PT for ambulation and exercise program.  IV saline locked.  O2 discontinued.    The remainder of the hospital course was dedicated to ambulation and strengthening.   The patient was discharged on 1 Day Post-Op in  Good condition.  Blood products given:none  DIAGNOSTIC STUDIES: Recent vital signs: Patient Vitals for the past 24 hrs:  BP Temp Temp src Pulse Resp SpO2  12/02/15 0330 (!) 120/100 mmHg 98.2 F (36.8 C) Oral 78 16 97 %  12/02/15 0025 124/68 mmHg 98 F (36.7 C) Oral 82 16 97 %  12/01/15  1958 (!) 126/57 mmHg 98 F (36.7 C) Oral 84 16 94 %  12/01/15 1538 (!) 119/58 mmHg 98.7 F (37.1 C) Oral 72 14 99 %  12/01/15 1500 128/74 mmHg - - 67 11 97 %  12/01/15 1445 - - - 67 15 95 %  12/01/15 1430 109/61 mmHg - - 66 17 94 %  12/01/15 1415 122/62 mmHg - - 70 14 97 %  12/01/15 1414 122/62 mmHg 97.6 F (36.4 C) - 67 13 94 %       Recent laboratory studies:  Recent Labs  12/01/15 1543 12/02/15 0331  WBC 5.7 12.5*  HGB 13.0 12.8*  HCT 39.6 38.8*  PLT 248 299    Recent Labs  12/01/15 1543 12/02/15 0331  NA  --  141  K  --  4.1  CL  --  104  CO2  --  23  BUN  --  11  CREATININE 0.97 0.94  GLUCOSE  --  182*  CALCIUM  --  9.3   Lab Results  Component Value Date   INR 1.11 11/21/2015     Recent Radiographic Studies :  No results found.  DISCHARGE INSTRUCTIONS: Discharge Instructions    CPM    Complete by:  As directed   Continuous passive motion machine (CPM):      Use the  CPM from 0 to 90 for 4-6 hours per day.      You may increase by 10 per day.  You may break it up into 2 or 3 sessions per day.      Use CPM for 2 weeks or until you are told to stop.     Call MD / Call 911    Complete by:  As directed   If you experience chest pain or shortness of breath, CALL 911 and be transported to the hospital emergency room.  If you develope a fever above 101 F, pus (white drainage) or increased drainage or redness at the wound, or calf pain, call your surgeon's office.     Change dressing    Complete by:  As directed   Change dressing on tomorrow, then change the dressing daily with sterile 4 x 4 inch gauze dressing and apply TED hose.  You may clean the incision with alcohol prior to redressing.     Constipation Prevention    Complete by:  As directed   Drink plenty of fluids.  Prune juice may be helpful.  You may use a stool softener, such as Colace (over the counter) 100 mg twice a day.  Use MiraLax (over the counter) for constipation as needed.     Diet - low sodium heart healthy    Complete by:  As directed      Discharge instructions    Complete by:  As directed   INSTRUCTIONS AFTER JOINT REPLACEMENT   Remove items at home which could result in a fall. This includes throw rugs or furniture in walking pathways ICE to the affected joint every three hours while awake for 30 minutes at a time, for at least the first 3-5 days, and then as needed for pain and swelling.  Continue to use ice for pain and swelling. You may notice swelling that will progress down to the foot and ankle.  This is normal after surgery.  Elevate your leg when you are not up walking on it.   Continue to use the breathing machine you got in the hospital (incentive spirometer) which will help keep  your temperature down.  It is common for your temperature to cycle up and down following surgery, especially at night when you are not up moving around and exerting yourself.  The breathing machine keeps  your lungs expanded and your temperature down.   DIET:  As you were doing prior to hospitalization, we recommend a well-balanced diet.  DRESSING / WOUND CARE / SHOWERING  You may change your dressing 3-5 days after surgery.  Then change the dressing every day with sterile gauze.  Please use good hand washing techniques before changing the dressing.  Do not use any lotions or creams on the incision until instructed by your surgeon.  ACTIVITY  Increase activity slowly as tolerated, but follow the weight bearing instructions below.   No driving for 6 weeks or until further direction given by your physician.  You cannot drive while taking narcotics.  No lifting or carrying greater than 10 lbs. until further directed by your surgeon. Avoid periods of inactivity such as sitting longer than an hour when not asleep. This helps prevent blood clots.  You may return to work once you are authorized by your doctor.     WEIGHT BEARING   Weight bearing as tolerated with assist device (walker, cane, etc) as directed, use it as long as suggested by your surgeon or therapist, typically at least 4-6 weeks.   EXERCISES  Results after joint replacement surgery are often greatly improved when you follow the exercise, range of motion and muscle strengthening exercises prescribed by your doctor. Safety measures are also important to protect the joint from further injury. Any time any of these exercises cause you to have increased pain or swelling, decrease what you are doing until you are comfortable again and then slowly increase them. If you have problems or questions, call your caregiver or physical therapist for advice.   Rehabilitation is important following a joint replacement. After just a few days of immobilization, the muscles of the leg can become weakened and shrink (atrophy).  These exercises are designed to build up the tone and strength of the thigh and leg muscles and to improve motion. Often  times heat used for twenty to thirty minutes before working out will loosen up your tissues and help with improving the range of motion but do not use heat for the first two weeks following surgery (sometimes heat can increase post-operative swelling).   These exercises can be done on a training (exercise) mat, on the floor, on a table or on a bed. Use whatever works the best and is most comfortable for you.    Use music or television while you are exercising so that the exercises are a pleasant break in your day. This will make your life better with the exercises acting as a break in your routine that you can look forward to.   Perform all exercises about fifteen times, three times per day or as directed.  You should exercise both the operative leg and the other leg as well.   Exercises include:   Quad Sets - Tighten up the muscle on the front of the thigh (Quad) and hold for 5-10 seconds.   Straight Leg Raises - With your knee straight (if you were given a brace, keep it on), lift the leg to 60 degrees, hold for 3 seconds, and slowly lower the leg.  Perform this exercise against resistance later as your leg gets stronger.  Leg Slides: Lying on your back, slowly slide your foot toward your  buttocks, bending your knee up off the floor (only go as far as is comfortable). Then slowly slide your foot back down until your leg is flat on the floor again.  Angel Wings: Lying on your back spread your legs to the side as far apart as you can without causing discomfort.  Hamstring Strength:  Lying on your back, push your heel against the floor with your leg straight by tightening up the muscles of your buttocks.  Repeat, but this time bend your knee to a comfortable angle, and push your heel against the floor.  You may put a pillow under the heel to make it more comfortable if necessary.   A rehabilitation program following joint replacement surgery can speed recovery and prevent re-injury in the future due to  weakened muscles. Contact your doctor or a physical therapist for more information on knee rehabilitation.    CONSTIPATION  Constipation is defined medically as fewer than three stools per week and severe constipation as less than one stool per week.  Even if you have a regular bowel pattern at home, your normal regimen is likely to be disrupted due to multiple reasons following surgery.  Combination of anesthesia, postoperative narcotics, change in appetite and fluid intake all can affect your bowels.   YOU MUST use at least one of the following options; they are listed in order of increasing strength to get the job done.  They are all available over the counter, and you may need to use some, POSSIBLY even all of these options:    Drink plenty of fluids (prune juice may be helpful) and high fiber foods Colace 100 mg by mouth twice a day  Senokot for constipation as directed and as needed Dulcolax (bisacodyl), take with full glass of water  Miralax (polyethylene glycol) once or twice a day as needed.  If you have tried all these things and are unable to have a bowel movement in the first 3-4 days after surgery call either your surgeon or your primary doctor.    If you experience loose stools or diarrhea, hold the medications until you stool forms back up.  If your symptoms do not get better within 1 week or if they get worse, check with your doctor.  If you experience "the worst abdominal pain ever" or develop nausea or vomiting, please contact the office immediately for further recommendations for treatment.   ITCHING:  If you experience itching with your medications, try taking only a single pain pill, or even half a pain pill at a time.  You can also use Benadryl over the counter for itching or also to help with sleep.   TED HOSE STOCKINGS:  Use stockings on both legs until for at least 2 weeks or as directed by physician office. They may be removed at night for sleeping.  MEDICATIONS:  See  your medication summary on the "After Visit Summary" that nursing will review with you.  You may have some home medications which will be placed on hold until you complete the course of blood thinner medication.  It is important for you to complete the blood thinner medication as prescribed.  PRECAUTIONS:  If you experience chest pain or shortness of breath - call 911 immediately for transfer to the hospital emergency department.   If you develop a fever greater that 101 F, purulent drainage from wound, increased redness or drainage from wound, foul odor from the wound/dressing, or calf pain - CONTACT YOUR SURGEON.  FOLLOW-UP APPOINTMENTS:  If you do not already have a post-op appointment, please call the office for an appointment to be seen by your surgeon.  Guidelines for how soon to be seen are listed in your "After Visit Summary", but are typically between 1-4 weeks after surgery.  OTHER INSTRUCTIONS:   Knee Replacement:  Do not place pillow under knee, focus on keeping the knee straight while resting. CPM instructions: 0-90 degrees, 2 hours in the morning, 2 hours in the afternoon, and 2 hours in the evening. Place foam block, curve side up under heel at all times except when in CPM or when walking.  DO NOT modify, tear, cut, or change the foam block in any way.  MAKE SURE YOU:  Understand these instructions.  Get help right away if you are not doing well or get worse.    Thank you for letting us be a part of your medical care team.  It is a privilege we respect greatly.  We hope these instructions will help you stay on track for a fast and full recovery!     Driving restrictions    Complete by:  As directed   No driving for 4 weeks     Increase activity slowly as tolerated    Complete by:  As directed            DISCHARGE MEDICATIONS:     Medication List    STOP taking these medications        aspirin EC 81 MG tablet      nabumetone 750 MG tablet  Commonly known as:  RELAFEN      TAKE these medications        cetirizine 10 MG tablet  Commonly known as:  ZYRTEC  Take 10 mg by mouth daily.     divalproex 500 MG 24 hr tablet  Commonly known as:  DEPAKOTE ER  Take 500 mg by mouth daily.     enoxaparin 40 MG/0.4ML injection  Commonly known as:  LOVENOX  Inject 0.4 mLs (40 mg total) into the skin daily.     escitalopram 20 MG tablet  Commonly known as:  LEXAPRO  Take 20 mg by mouth daily.     fenofibrate 145 MG tablet  Commonly known as:  TRICOR  Take 145 mg by mouth daily.     Fish Oil 1000 MG Caps  Take 1,000 mg by mouth daily.     folic acid Q000111Q MCG tablet  Commonly known as:  FOLVITE  Take 400 mcg by mouth daily.     gabapentin 300 MG capsule  Commonly known as:  NEURONTIN  Take 300 mg by mouth 3 (three) times daily.     GLYXAMBI 25-5 MG Tabs  Generic drug:  Empagliflozin-Linagliptin  Take 1 tablet by mouth daily.     losartan 25 MG tablet  Commonly known as:  COZAAR  Take 25 mg by mouth daily.     metFORMIN 1000 MG tablet  Commonly known as:  GLUCOPHAGE  Take 1,000 mg by mouth 2 (two) times daily with a meal.     methocarbamol 500 MG tablet  Commonly known as:  ROBAXIN  Take 1-2 tablets (500-1,000 mg total) by mouth every 6 (six) hours as needed for muscle spasms.     multivitamin with minerals Tabs tablet  Take 1 tablet by mouth daily.     omeprazole 20 MG capsule  Commonly known as:  PRILOSEC  Take 20 mg by mouth 2 (two) times  daily before a meal.     ondansetron 4 MG tablet  Commonly known as:  ZOFRAN  Take 1 tablet (4 mg total) by mouth every 6 (six) hours as needed for nausea.     oxyCODONE 5 MG immediate release tablet  Commonly known as:  Oxy IR/ROXICODONE  Take 1-2 tablets (5-10 mg total) by mouth every 3 (three) hours as needed for breakthrough pain.     oxyCODONE 10 mg 12 hr tablet  Commonly known as:  OXYCONTIN  Take 1 tablet (10 mg total) by mouth every  12 (twelve) hours.     pravastatin 80 MG tablet  Commonly known as:  PRAVACHOL  Take 80 mg by mouth daily.     topiramate 25 MG tablet  Commonly known as:  TOPAMAX  Take 25 mg by mouth 2 (two) times daily.        FOLLOW UP VISIT:       Follow-up Information    Follow up with Rudean Haskell, MD. Call on 12/16/2015.   Specialty:  Orthopedic Surgery   Contact information:   [facility] [address] [phone]       DISPOSITION: HOME VS. SNF  CONDITION:  Good   Donia Ast 12/02/2015, 12:40 PM

## 2015-12-02 NOTE — Anesthesia Postprocedure Evaluation (Signed)
Anesthesia Post Note  Patient: Jared Wood  Procedure(s) Performed: Procedure(s) (LRB): TOTAL KNEE ARTHROPLASTY (Right)  Patient location during evaluation: PACU Anesthesia Type: Spinal Level of consciousness: oriented and awake and alert Pain management: pain level controlled Vital Signs Assessment: post-procedure vital signs reviewed and stable Respiratory status: spontaneous breathing, respiratory function stable and patient connected to nasal cannula oxygen Cardiovascular status: blood pressure returned to baseline and stable Postop Assessment: no headache, no backache and spinal receding Anesthetic complications: no    Last Vitals:  Filed Vitals:   12/02/15 0025 12/02/15 0330  BP: 124/68 120/100  Pulse: 82 78  Temp: 36.7 C 36.8 C  Resp: 16 16    Last Pain:  Filed Vitals:   12/02/15 1159  PainSc: [place]

## 2015-12-16 DIAGNOSIS — Z96651 Presence of right artificial knee joint: Secondary | ICD-10-CM

## 2015-12-16 HISTORY — DX: Presence of right artificial knee joint: Z96.651

## 2017-01-17 HISTORY — PX: COLONOSCOPY: SHX174

## 2017-01-17 LAB — HM COLONOSCOPY

## 2017-08-02 DIAGNOSIS — C4490 Unspecified malignant neoplasm of skin, unspecified: Secondary | ICD-10-CM

## 2017-08-02 HISTORY — DX: Unspecified malignant neoplasm of skin, unspecified: C44.90

## 2019-04-03 ENCOUNTER — Emergency Department (HOSPITAL_COMMUNITY)
Admission: EM | Admit: 2019-04-03 | Discharge: 2019-04-04 | Disposition: A | Discharge status: Home / Self Care | Payer: Managed Care, Other (non HMO) | Attending: Emergency Medicine | Admitting: Emergency Medicine

## 2019-04-03 ENCOUNTER — Emergency Department (HOSPITAL_COMMUNITY): Payer: Managed Care, Other (non HMO)

## 2019-04-03 DIAGNOSIS — Z96651 Presence of right artificial knee joint: Secondary | ICD-10-CM | POA: Insufficient documentation

## 2019-04-03 DIAGNOSIS — R404 Transient alteration of awareness: Secondary | ICD-10-CM | POA: Insufficient documentation

## 2019-04-03 DIAGNOSIS — Z7901 Long term (current) use of anticoagulants: Secondary | ICD-10-CM | POA: Insufficient documentation

## 2019-04-03 DIAGNOSIS — R140 Abdominal distension (gaseous): Secondary | ICD-10-CM | POA: Insufficient documentation

## 2019-04-03 DIAGNOSIS — I10 Essential (primary) hypertension: Secondary | ICD-10-CM | POA: Diagnosis not present

## 2019-04-03 DIAGNOSIS — E119 Type 2 diabetes mellitus without complications: Secondary | ICD-10-CM | POA: Diagnosis not present

## 2019-04-03 DIAGNOSIS — F449 Dissociative and conversion disorder, unspecified: Secondary | ICD-10-CM

## 2019-04-03 DIAGNOSIS — Z8673 Personal history of transient ischemic attack (TIA), and cerebral infarction without residual deficits: Secondary | ICD-10-CM | POA: Insufficient documentation

## 2019-04-03 DIAGNOSIS — Z79899 Other long term (current) drug therapy: Secondary | ICD-10-CM | POA: Insufficient documentation

## 2019-04-03 DIAGNOSIS — Z87891 Personal history of nicotine dependence: Secondary | ICD-10-CM | POA: Diagnosis not present

## 2019-04-03 DIAGNOSIS — R51 Headache: Secondary | ICD-10-CM | POA: Insufficient documentation

## 2019-04-03 DIAGNOSIS — Z7984 Long term (current) use of oral hypoglycemic drugs: Secondary | ICD-10-CM | POA: Diagnosis not present

## 2019-04-03 LAB — I-STAT CHEM 8, ED
BUN: 15 mg/dL (ref 6–20)
Calcium, Ion: 1.15 mmol/L (ref 1.15–1.40)
Chloride: 109 mmol/L (ref 98–111)
Creatinine, Ser: 0.7 mg/dL (ref 0.61–1.24)
Glucose, Bld: 138 mg/dL — ABNORMAL HIGH (ref 70–99)
HCT: 43 % (ref 39.0–52.0)
Hemoglobin: 14.6 g/dL (ref 13.0–17.0)
Potassium: 3.6 mmol/L (ref 3.5–5.1)
Sodium: 141 mmol/L (ref 135–145)
TCO2: 18 mmol/L — ABNORMAL LOW (ref 22–32)

## 2019-04-03 LAB — CBG MONITORING, ED
Glucose-Capillary: 132 mg/dL — ABNORMAL HIGH (ref 70–99)
Glucose-Capillary: 141 mg/dL — ABNORMAL HIGH (ref 70–99)

## 2019-04-03 LAB — POCT I-STAT EG7
Acid-base deficit: 4 mmol/L — ABNORMAL HIGH (ref 0.0–2.0)
Bicarbonate: 20.4 mmol/L (ref 20.0–28.0)
Calcium, Ion: 1.18 mmol/L (ref 1.15–1.40)
HCT: 39 % (ref 39.0–52.0)
Hemoglobin: 13.3 g/dL (ref 13.0–17.0)
O2 Saturation: 96 %
Potassium: 3.5 mmol/L (ref 3.5–5.1)
Sodium: 141 mmol/L (ref 135–145)
TCO2: 21 mmol/L — ABNORMAL LOW (ref 22–32)
pCO2, Ven: 34.4 mmHg — ABNORMAL LOW (ref 44.0–60.0)
pH, Ven: 7.382 (ref 7.250–7.430)
pO2, Ven: 84 mmHg — ABNORMAL HIGH (ref 32.0–45.0)

## 2019-04-03 LAB — DIFFERENTIAL
Abs Immature Granulocytes: 0.02 10*3/uL (ref 0.00–0.07)
Basophils Absolute: 0.1 10*3/uL (ref 0.0–0.1)
Basophils Relative: 2 %
Eosinophils Absolute: 0.2 10*3/uL (ref 0.0–0.5)
Eosinophils Relative: 4 %
Immature Granulocytes: 0 %
Lymphocytes Relative: 33 %
Lymphs Abs: 1.9 10*3/uL (ref 0.7–4.0)
Monocytes Absolute: 0.5 10*3/uL (ref 0.1–1.0)
Monocytes Relative: 8 %
Neutro Abs: 3 10*3/uL (ref 1.7–7.7)
Neutrophils Relative %: 53 %

## 2019-04-03 LAB — COMPREHENSIVE METABOLIC PANEL
ALT: 42 U/L (ref 0–44)
AST: 32 U/L (ref 15–41)
Albumin: 4.3 g/dL (ref 3.5–5.0)
Alkaline Phosphatase: 42 U/L (ref 38–126)
Anion gap: 13 (ref 5–15)
BUN: 15 mg/dL (ref 6–20)
CO2: 19 mmol/L — ABNORMAL LOW (ref 22–32)
Calcium: 9.4 mg/dL (ref 8.9–10.3)
Chloride: 107 mmol/L (ref 98–111)
Creatinine, Ser: 0.86 mg/dL (ref 0.61–1.24)
GFR calc Af Amer: >60 mL/min (ref >60)
GFR calc non Af Amer: >60 mL/min (ref >60)
Glucose, Bld: 139 mg/dL — ABNORMAL HIGH (ref 70–99)
Potassium: 3.7 mmol/L (ref 3.5–5.1)
Sodium: 139 mmol/L (ref 135–145)
Total Bilirubin: 0.7 mg/dL (ref 0.3–1.2)
Total Protein: 7.5 g/dL (ref 6.5–8.1)

## 2019-04-03 LAB — CBC
HCT: 43.3 % (ref 39.0–52.0)
Hemoglobin: 14.2 g/dL (ref 13.0–17.0)
MCH: 29 pg (ref 26.0–34.0)
MCHC: 32.8 g/dL (ref 30.0–36.0)
MCV: 88.4 fL (ref 80.0–100.0)
Platelets: 359 10*3/uL (ref 150–400)
RBC: 4.9 MIL/uL (ref 4.22–5.81)
RDW: 13.3 % (ref 11.5–15.5)
WBC: 5.8 10*3/uL (ref 4.0–10.5)
nRBC: 0 % (ref 0.0–0.2)

## 2019-04-03 LAB — AMMONIA: Ammonia: 36 umol/L — ABNORMAL HIGH (ref 9–35)

## 2019-04-03 LAB — PROTIME-INR
INR: 1.1 (ref 0.8–1.2)
Prothrombin Time: 13.8 seconds (ref 11.4–15.2)

## 2019-04-03 LAB — APTT: aPTT: 35 seconds (ref 24–36)

## 2019-04-03 MED ORDER — DIPHENHYDRAMINE HCL 50 MG/ML IJ SOLN
25.0000 mg | Freq: Once | INTRAMUSCULAR | Status: AC
  Administered 2019-04-03: 19:00:00 25 mg via INTRAVENOUS
  Filled 2019-04-03: qty 1

## 2019-04-03 MED ORDER — NALOXONE HCL 2 MG/2ML IJ SOSY
1.0000 mg | PREFILLED_SYRINGE | Freq: Once | INTRAMUSCULAR | Status: AC
  Administered 2019-04-04: 1 mg via INTRAVENOUS
  Filled 2019-04-03: qty 2

## 2019-04-03 MED ORDER — PROCHLORPERAZINE EDISYLATE 10 MG/2ML IJ SOLN
10.0000 mg | Freq: Once | INTRAMUSCULAR | Status: AC
  Administered 2019-04-03: 19:00:00 10 mg via INTRAVENOUS
  Filled 2019-04-03: qty 2

## 2019-04-03 MED ORDER — IOHEXOL 300 MG/ML  SOLN
125.0000 mL | Freq: Once | INTRAMUSCULAR | Status: AC | PRN
  Administered 2019-04-03: 125 mL via INTRAVENOUS

## 2019-04-03 MED ORDER — SODIUM CHLORIDE 0.9% FLUSH: 3.0000 mL | Freq: Once | INTRAVENOUS | Status: DC

## 2019-04-03 MED ORDER — IOHEXOL 350 MG/ML SOLN
100.0000 mL | Freq: Once | INTRAVENOUS | Status: AC | PRN
  Administered 2019-04-03: 100 mL via INTRAVENOUS

## 2019-04-03 NOTE — ED Triage Notes (Signed)
Pt arrived via [employer] from home after wife reported pt having stroke-like symptoms. Per ems, pt ws able to call wife just before losing ability to speak clearly and falling to the ground. EMS reported no injury resulted from fall. Pt hd hx of CVA in 2016 w/ left sided residual weakness. Reported weakness was far worse today. Pt presented aphasic, left side was flaccid at triage. Rapid response, EDP, Neuro met EMS at bridge.

## 2019-04-03 NOTE — Consult Note (Signed)
Neurology Consultation Reason for Consult: Left-sided weakness Referring Physician: Theodis Sato  CC: Left-sided weakness  History is obtained from: Patient  HPI: Cartrell Filardi is a 53 y.o. male with a history of conversion disorder, hemiplegic migraine who presents with left-sided weakness.  He apparently was treated with IV TPA in the past for presumed stroke, though afterwards he was told they were not certain that he had a stroke.  He presents today with left-sided weakness.  He was taken for a stat CT/CTA which were negative.  His wife reports melenic stools for the past 2 weeks which she is being worked up for as an outpatient.  Today, he was normal and then went to mow his daughter's grass while he was there he called his wife and reported that he felt like his sugar was low.  On arrival, he was essentially unresponsive and family number tried to get him to drink some sips of sugary beverage.  EMS was called and they activated code stroke.  On arrival to the emergency department, he had slow stuttering speech and some other nonorganic findings as outlined below.  He was taken for a stat CTA/CT which were negative.  He is complaining of a severe right-sided headache.   LKW: 5 PM tpa given?: no, recent concern for GI bleeding    ROS: A 14 point ROS was performed and is negative except as noted in the HPI.   Past Medical History:  Diagnosis Date  . Arthritis   . Conversion disorder   . Diabetes mellitus without complication (HCC)    Type 2  . GERD (gastroesophageal reflux disease)   . Hard of hearing   . Hemiplegic migraine   . History of bleeding ulcers   . History of kidney stones   . Hyperlipemia   . Hypertension   . Left-sided weakness   . PONV (postoperative nausea and vomiting)    Nausea  . Stroke [facility]) 04/2015     Family history: Unable to obtain due to slow stuttering speech   Social History:  reports that he has quit smoking. He does not have any smokeless tobacco  history on file. He reports current alcohol use. He reports that he does not use drugs.   Exam: Current vital signs: BP 115/77   Pulse 66   Resp 19   Wt 118.9 kg   SpO2 96%   BMI 38.71 kg/m  Vital signs in last 24 hours: Pulse Rate:  [66-68] 66 (09/01 1930) Resp:  [19] 19 (09/01 1930) BP: (115-122)/(77-87) 115/77 (09/01 1930) SpO2:  [96 %] 96 % (09/01 1930) Weight:  [118.9 kg] 118.9 kg (09/01 1800)   Physical Exam  Constitutional: Appears well-developed and well-nourished.  Psych: Affect appropriate to situation Eyes: No scleral injection HENT: No OP obstrucion Head: Normocephalic.  Cardiovascular: Normal rate and regular rhythm.  Respiratory: Effort normal, non-labored breathing GI: Soft.  No distension. There is no tenderness.  Skin: WDI  Neuro: Mental Status: Patient is awake, alert, he has slow stuttering speech, but is able to answer questions appropriately suspect a nonorganic speech pattern. Cranial Nerves: II: Visual Fields are full. Pupils are equal, round, and reactive to light.   III,IV, VI: EOMI without ptosis or diploplia.  V: Facial sensation is diminished on the left, he does split midline to vibration VII: He does not elevate the left side of his face when asked to smile, however when asked to puff his cheeks out, he holds the left side taut VIII: hearing is  intact to voice X: Uvula elevates symmetrically XI: Shoulder shrug is symmetric. Motor: He has complete flaccidity of the left upper extremity at the beginning, however when I hold his arm with a positioned over his face he holds it aloft until I move it to where it would not fall on his face at which point it abruptly drops.  Significant inconsistency in his left-sided weakness. 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  I would favor getting an MRI to rule out acute stroke, however if this is negative then I would treat this as a psychogenic episode.  He does have a headache, it is possible that he may have some degree of complicated migraine with embellishment and therefore I think it be reasonable to attempt a migraine cocktail to see if it provides him some relief.  Recommendations: 1) Compazine/Benadryl 2) MRI brain 3) no further recommendations if MRI is negative.   Roland Rack, MD [facility] [phone]  If 7pm- 7am, please page neurology on call as listed in [place].

## 2019-04-03 NOTE — ED Notes (Signed)
Patient remains somnolent, more aroussable with painful and verbal stimuli. Dr. Johnney Killian at bedside to assess patient.

## 2019-04-03 NOTE — ED Notes (Signed)
Pocket knife removed from pt's pocket during CT scan. Placed in red lab bag along with other small belongs removed from pt at time of scan.

## 2019-04-03 NOTE — Code Documentation (Signed)
Patient last known well this afternoon at 4pm.  He was out mowing his grass when he called his wife because he felt like his "blood sugar was low".   Per wife he became altered while he was talking to her.  EMS called code stroke en route.  Stat labs and head CT done.  NIHSS 11  CTA/CTP done.  Patient starting to talk more, answering questions.  He complains of a posterior HA. Plan MRI No TPA, recent melena stools VS and Neuro check q 2hrs

## 2019-04-03 NOTE — ED Notes (Signed)
Patient transported to CT 

## 2019-04-03 NOTE — ED Notes (Signed)
Patient arrived back to room from MRI. Patient resting at this time. Wife at bedside updated.

## 2019-04-03 NOTE — ED Notes (Signed)
Patient transported to MRI 

## 2019-04-03 NOTE — ED Notes (Signed)
ED Provider at bedside. 

## 2019-04-03 NOTE — ED Provider Notes (Signed)
[facility] EMERGENCY DEPARTMENT Provider Note   CSN: [id number] Arrival date & time: 04/03/19  [place]     History   Chief Complaint No chief complaint on file.   HPI Jared Wood is a 53 y.o. male.     HPI Patient called his wife at a little after 2 PM this afternoon.  He had gone over to mow the yard at his daughter's house.  He reported that he did not feel well and he thought maybe his sugar was low.  He then stopped speaking clearly.  She called his stepdad who live nearby and had him check on him.  He was out lying next to his mower and very difficult to arouse.  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  Reportedly this was worse today than previously and EMS found the patient not to be responding verbally.  No injuries that were identified.  He was brought in as a code stroke.  His wife reports that he does typically wake up at about 2 in the morning to go to work.  She reports he would be very drowsy this time of the day but not typical for him not to awaken at all. Past Medical History:  Diagnosis Date  . Arthritis   . Conversion disorder   . Diabetes mellitus without complication (HCC)    Type 2  . GERD (gastroesophageal reflux disease)   . Hard of hearing   . Hemiplegic migraine   . History of bleeding ulcers   . History of kidney stones   . Hyperlipemia   . Hypertension   . Left-sided weakness   . PONV (postoperative nausea and vomiting)    Nausea  . Stroke [facility]) 04/2015    Patient Active Problem List   Diagnosis Date Noted  . 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 APPENDECTOMY    . CHOLECYSTECTOMY    . COLONOSCOPY W/ ENDOSCOPIC [place]    . CORONARY ANGIOPLASTY    . HAND SURGERY Left    hand reconstruction  . HERNIA REPAIR    . LITHOTRIPSY    . SHOULDER ARTHROSCOPY WITH OPEN ROTATOR CUFF REPAIR Right   . TONSILLECTOMY     age 27  . TOTAL KNEE  ARTHROPLASTY Right 12/01/2015   Procedure: TOTAL KNEE ARTHROPLASTY;  Surgeon: Vickey Huger, MD;  Location: [facility];  Service: Orthopedics;  Laterality: Right;        Home Medications    Prior to Admission medications   Medication Sig Start Date End Date Taking? Authorizing Provider  cetirizine (ZYRTEC) 10 MG tablet Take 10 mg by mouth daily.    [provider]  divalproex (DEPAKOTE ER) 500 MG 24 hr tablet Take 500 mg by mouth daily.    [provider]  Empagliflozin-Linagliptin (GLYXAMBI) 25-5 MG TABS Take 1 tablet by mouth daily.    [provider]  enoxaparin (LOVENOX) 40 MG/0.4ML injection Inject 0.4 mLs (40 mg total) into the skin daily. 12/02/15   Donia Ast, PA  escitalopram (LEXAPRO) 20 MG tablet Take 20 mg by mouth daily.    [provider]  fenofibrate (TRICOR) 145 MG tablet Take 145 mg by mouth daily.    [provider]  folic acid (FOLVITE) Q000111Q MCG tablet Take 400 mcg by mouth daily.    [provider]  gabapentin (NEURONTIN) 300 MG capsule Take 300 mg by mouth 3 (three)  times daily.    [provider]  losartan (COZAAR) 25 MG tablet Take 25 mg by mouth daily.    [provider]  metFORMIN (GLUCOPHAGE) 1000 MG tablet Take 1,000 mg by mouth 2 (two) times daily with a meal.    [provider]  methocarbamol (ROBAXIN) 500 MG tablet Take 1-2 tablets (500-1,000 mg total) by mouth every 6 (six) hours as needed for muscle spasms. 12/02/15   Donia Ast, PA  Multiple Vitamin (MULTIVITAMIN WITH MINERALS) TABS tablet Take 1 tablet by mouth daily.    [provider]  Omega-3 Fatty Acids (FISH OIL) 1000 MG CAPS Take 1,000 mg by mouth daily.    [provider]  omeprazole (PRILOSEC) 20 MG capsule Take 20 mg by mouth 2 (two) times daily before a meal.    [provider]  ondansetron (ZOFRAN) 4 MG tablet Take 1 tablet (4 mg total) by mouth every 6 (six) hours as needed for nausea.  12/02/15   Donia Ast, PA  oxyCODONE (OXY IR/ROXICODONE) 5 MG immediate release tablet Take 1-2 tablets (5-10 mg total) by mouth every 3 (three) hours as needed for breakthrough pain. 12/02/15   Donia Ast, PA  oxyCODONE (OXYCONTIN) 10 mg 12 hr tablet Take 1 tablet (10 mg total) by mouth every 12 (twelve) hours. 12/02/15   Donia Ast, PA  pravastatin (PRAVACHOL) 80 MG tablet Take 80 mg by mouth daily.    [provider]  topiramate (TOPAMAX) 25 MG tablet Take 25 mg by mouth 2 (two) times daily.    [provider]    Family History No family history on file.  Social History Social History   Tobacco Use  . Smoking status: Former Smoker  Substance Use Topics  . Alcohol use: Yes    Comment: social  . Drug use: No     Allergies   Demerol [meperidine]   Review of Systems Review of Systems Level 5 caveat cannot obtain review of systems due to patient condition.  Physical Exam Updated Vital Signs Wt 118.9 kg   BMI 38.71 kg/m   Physical Exam Constitutional:      Comments: On arrival patient is speaking very little but slightly tremulous in keeping his eyes mostly closed.  No respiratory distress.  Airway widely patent.  HENT:     Head: Normocephalic and atraumatic.     Mouth/Throat:     Mouth: Mucous membranes are moist.     Pharynx: Oropharynx is clear.  Eyes:     Extraocular Movements: Extraocular movements intact.     Pupils: Pupils are equal, round, and reactive to light.  Neck:     Musculoskeletal: Neck supple.  Cardiovascular:     Rate and Rhythm: Normal rate and regular rhythm.  Pulmonary:     Effort: Pulmonary effort is normal.     Breath sounds: Normal breath sounds.  Abdominal:     General: There is no distension.     Palpations: Abdomen is soft.     Tenderness: There is no abdominal tenderness. There is no guarding.  Musculoskeletal: Normal range of motion.        General: No swelling, tenderness or signs of injury.      Right lower leg: No edema.     Left lower leg: No edema.  Skin:    General: Skin is warm and dry.  Neurological:     Comments: Is patient intermittently has had a few sentences.  No evident focal motor deficit.  He has predominantly been asleep.  When awakened he can use both of his arms to reposition       ED Treatments / Results  Labs (all labs ordered are listed, but only abnormal results are displayed) Labs Reviewed  I-STAT CHEM 8, ED - Abnormal; Notable for the following components:      Result Value   Glucose, Bld 138 (*)    TCO2 18 (*)    All other components within normal limits  PROTIME-INR  APTT  CBC  DIFFERENTIAL  COMPREHENSIVE METABOLIC PANEL  CBG MONITORING, ED    EKG EKG Interpretation  Date/Time:  Tuesday April 03 2019 19:19:18 EDT Ventricular Rate:  67 PR Interval:    QRS Duration: 100 QT Interval:  408 QTC Calculation: 431 R Axis:   84 Text Interpretation:  Sinus rhythm Minimal ST depression, inferior leads no sig change Confirmed by Charlesetta Shanks [phone]) on 04/04/2019 12:02:07 AM   Radiology No results found.  Procedures Procedures (including critical care time)  Medications Ordered in ED Medications  sodium chloride flush (NS) 0.9 % injection 3 mL (has no administration in time range)     Initial Impression / Assessment and Plan / ED Course  I have reviewed the triage vital signs and the nursing notes.  Pertinent labs & imaging results that were available during my care of the patient were reviewed by me and considered in my medical decision making (see chart for details).  Clinical Course as of Apr 02 2356  Tue Apr 03, 2019  2356 Patient continues to be somnolent.  Vital signs are stable.  He will briefly open his eyes and say a few words to vigorous stimulus.  Will try 1 mg of Narcan.   [MP]    Clinical Course User Index [MP] Charlesetta Shanks, MD       Patient seen by neurology Dr. Leonel Ramsay.  Initially low suspicion  for acute CVA.  MRI and angiogram negative for acute findings.  Patient has normal vital signs.  He is not appear clinically unwell.  He has remained very somnolent.  He did get treated by neurology for migraine type presentation with Compazine and Benadryl.  His initial presentation however was also for being very difficult to arouse.  He has been under observation with normal vital signs will try milligram of Narcan.  We will continue to observe and if does not resolve possible admission but clinically and diagnostically no signs of focal neurologic injury or acute infectious etiology.  Final Clinical Impressions(s) / ED Diagnoses   Final diagnoses:  None    ED Discharge Orders    None       Charlesetta Shanks, MD 04/04/19 0004

## 2019-04-03 NOTE — ED Notes (Addendum)
Unable to complete 2 hour Neuro assessment due at 2110 due to patient's somnolence. Patient arroussable with painful stimuli but has difficulty staying awake. Vital signs stable, Dr. Johnney Killian at bedside to assess patient, no new orders at this time. CBG 141. Wife remains at bedside.

## 2019-04-04 NOTE — ED Notes (Signed)
No change in level of consciousness after narcan

## 2019-04-04 NOTE — ED Notes (Signed)
Patient verbalized understanding of dc instructions, vss, ambulatory with nad.   

## 2019-04-04 NOTE — ED Notes (Addendum)
Patient able to arouse with painful stimuli. Pt alert and given something cold to drink, able to maintain alertness and answer questions at this time. Will make MD aware

## 2019-06-14 ENCOUNTER — Other Ambulatory Visit: Payer: Self-pay

## 2019-06-14 ENCOUNTER — Encounter: Payer: Self-pay | Admitting: *Deleted

## 2019-06-14 ENCOUNTER — Encounter: Payer: Self-pay | Admitting: Neurology

## 2019-06-14 ENCOUNTER — Ambulatory Visit (INDEPENDENT_AMBULATORY_CARE_PROVIDER_SITE_OTHER): Payer: Managed Care, Other (non HMO) | Admitting: Neurology

## 2019-06-14 VITALS — BP 114/70 | HR 68 | Temp 97.4°F | Ht 69.0 in | Wt 258.0 lb

## 2019-06-14 DIAGNOSIS — G43711 Chronic migraine without aura, intractable, with status migrainosus: Secondary | ICD-10-CM

## 2019-06-14 DIAGNOSIS — R419 Unspecified symptoms and signs involving cognitive functions and awareness: Secondary | ICD-10-CM

## 2019-06-14 DIAGNOSIS — G43109 Migraine with aura, not intractable, without status migrainosus: Secondary | ICD-10-CM

## 2019-06-14 DIAGNOSIS — R519 Headache, unspecified: Secondary | ICD-10-CM | POA: Diagnosis not present

## 2019-06-14 DIAGNOSIS — R400 Somnolence: Secondary | ICD-10-CM

## 2019-06-14 MED ORDER — AIMOVIG 70 MG/ML ~~LOC~~ SOAJ: 140.0000 mg | SUBCUTANEOUS | 0 refills | Status: DC

## 2019-06-14 MED ORDER — AIMOVIG 140 MG/ML ~~LOC~~ SOAJ: 140.0000 mg | SUBCUTANEOUS | 11 refills | Status: DC

## 2019-06-14 NOTE — Progress Notes (Signed)
Discussed Aimovig with wife and patient per Dr. Jaynee Eagles. Discussed common side effects (injection site reaction, constipation). Advised to call office if he has a reaction. Pt/wife questions answered. Pt was educated on the correct administration of the pen. He understands to refrigerate the medication and let sit at room temperature x 30 minutes prior to injection. Rotate sites each month. Ok to use abdomen, thigh, or back of upper arm. Pt performed hand hygiene, RLQ of abdomen cleansed with alcohol swab then pt self-injected two 70 mg Aimovig pens. Bandaid applied to one site for temporary application. Pt given instructions for review and also given 4 more pens (2 pens every 30 days) to use until finished 08/14/2019 at which point the pt understands subsequent doses should be picked up from the pharmacy and will be the 140 mg pen (one injection every 30 days). He verbalized understanding.

## 2019-06-14 NOTE — Progress Notes (Signed)
[id number]    Provider:  Dr Jaynee Eagles Requesting Provider: Marco Collie, MD Primary Care Provider:  Marco Collie, MD  CC:  Daily headaches  HPI:  Jared Wood is a 53 y.o. male here as requested by Marco Collie, MD for migraines.  I reviewed notes from Dr. Nyra Capes.  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  Patient had an episode of alteration of mentation, EMS took him to [facility] and he was diagnosed with complicated migraines. However he had non-organic neuro findings.   Started 04/07/2015 he has had these spells. He went to the emergency room, had tpa, left-sided weakness, he still feels it is weak. Ever since then he has headaches. Headaches in the morning, snoring, obesity, has been diagnosed with OSA in the past not on cpap, excessive daytime somnolence. Hasn't been tested since 2017 at [facility] in [place] neurology. Headache sin the back of the head. Daily. He has migraines, they start on the top of the head, the whole head with presure, light and sound sensitivity, sound is worse, nausea, no vomiting, movement makes it worse, dark room makes it better, last 24 hours. He is taking Tylenol, ibuprofen, also for arthritis, bc powder. He was mowing his lawn a few weeks ago, he had a headache, worsening, he has lost 140 pounds, he was feeling worse, lightheaded thought it was his glucose dropping, wife provides much information, he was still symptomatic when wife got there 20 minutes later, but he was unresponsive in the yard, laid out in the yard, eyes closed, no abnormal movements, was able to hear everyone talking and then went out again patient  remembers "bits and pieces".   Reviewed notes, labs and imaging from outside physicians, which showed:  MRI brain showed No acute intracranial abnormalities including mass lesion or mass effect, hydrocephalus, extra-axial fluid collection, midline shift, hemorrhage, or acute infarction, large ischemic events (personally reviewed images)  CMP elevated glucose otherwise unremarkable.   Review of Systems: Patient complains of symptoms per HPI as well as the following symptoms: headache, weakness, loss of consciusness. Pertinent negatives and positives per HPI. All others negative.   Social History   Socioeconomic History   Marital status: Married    Spouse name: Not on file   Number of children: 3   Years of education: 2 yrs college   Highest education level: Not on file  Occupational History   Not on file  Social Needs   Financial resource strain: Not on file   Food insecurity    Worry: Not on file    Inability: Not on file   Transportation needs    Medical: Not on file    Non-medical: Not on file  Tobacco Use   Smoking status: Former Smoker   Smokeless tobacco: Current User    Types: Chew   Tobacco comment: uses 1-2 cans/day  Substance and Sexual Activity   Alcohol use: Yes    Comment: occasional   Drug use: No    Comment: quit hard drugs in 1990   Sexual activity: Not on file  Lifestyle   Physical activity    Days per week: Not on file    Minutes per session: Not on file   Stress: Not on file  Relationships   Social connections  Talks on phone: Not on file    Gets together: Not on file    Attends religious service: Not on file    Active member of club or organization: Not on file    Attends meetings of clubs or organizations: Not on file    Relationship status: Not on file   Intimate partner violence    Fear of current or ex partner: Not on file    Emotionally abused: Not on file    Physically abused: Not on file    Forced sexual activity:  Not on file  Other Topics Concern   Not on file  Social History Narrative   Lives at home with spouse   Caffeine: "a lot"    Family History  Problem Relation Age of Onset   Myocarditis Mother    Prostate cancer Father    Liver cancer Father    Kidney cancer Father    Other Father        lymph nodes   COPD Father    Emphysema Father    Congestive Heart Failure Father    High blood pressure Father    Arthritis Father     Past Medical History:  Diagnosis Date   Anxiety    Arthritis    Conversion disorder    Depression    Diabetes mellitus without complication ([place])    Type 2   GERD (gastroesophageal reflux disease)    Hard of hearing    Heart disease    Hemiplegic migraine    History of bleeding ulcers    History of kidney stones    Hyperlipemia    Hypertension    Left-sided weakness    Migraine    Neuropathy    PONV (postoperative nausea and vomiting)    Nausea   Skin cancer 2019   Stroke ([place]) 04/2015   pt's wife said they were told by a doctor this may have been conversion disorder. was given TPA and underwent rehabilitation.    Patient Active Problem List   Diagnosis Date Noted   Migraine with aura and without status migrainosus, not intractable 06/17/2019   Chronic migraine without aura, with intractable migraine, so stated, with status migrainosus 06/17/2019   Alteration of awareness 06/17/2019   S/P total knee replacement 12/01/2015    Past Surgical History:  Procedure Laterality Date   APPENDECTOMY  2002   CHOLECYSTECTOMY     2003 or 2004   COLONOSCOPY W/ ENDOSCOPIC [place]     CORONARY ANGIOPLASTY  2008   HAND SURGERY Left 2001   hand reconstruction   HERNIA REPAIR     LITHOTRIPSY     SHOULDER ARTHROSCOPY WITH OPEN ROTATOR CUFF REPAIR Right    TONSILLECTOMY     age 16   TONSILLECTOMY  89   TOTAL KNEE ARTHROPLASTY Right 12/01/2015   Procedure: TOTAL KNEE ARTHROPLASTY;  Surgeon: Vickey Huger, MD;   Location: [facility];  Service: Orthopedics;  Laterality: Right;    Current Outpatient Medications  Medication Sig Dispense Refill   acetaminophen (TYLENOL) 650 MG CR tablet Take 650 mg by mouth every 8 (eight) hours as needed for pain.     ARIPiprazole (ABILIFY) 5 MG tablet Take 5 mg by mouth daily.     aspirin EC 81 MG tablet Take 81 mg by mouth daily with lunch.     Aspirin-Salicylamide-Caffeine (BC HEADACHE POWDER PO) Take 1 packet by mouth as needed (for headaches).     clopidogrel (PLAVIX) 75 MG tablet Take 75 mg by  mouth daily.     dapagliflozin propanediol (FARXIGA) 5 MG TABS tablet Take 5 mg by mouth daily.     dicyclomine (BENTYL) 10 MG capsule Take 10 mg by mouth daily before breakfast.     escitalopram (LEXAPRO) 20 MG tablet Take 20 mg by mouth daily.     fenofibrate (TRICOR) 145 MG tablet Take 145 mg by mouth at bedtime.      folic acid (FOLVITE) Q000111Q MCG tablet Take 800 mcg by mouth daily with breakfast.      gabapentin (NEURONTIN) 300 MG capsule Take 300 mg by mouth 3 (three) times daily with meals.      gemfibrozil (LOPID) 600 MG tablet Take 600 mg by mouth 2 (two) times daily before a meal.     ibuprofen (ADVIL) 200 MG tablet Take 800 mg by mouth every 6 (six) hours as needed for headache (or pain).     levocetirizine (XYZAL) 5 MG tablet Take 5 mg by mouth daily after breakfast.     losartan (COZAAR) 25 MG tablet Take 25 mg by mouth daily.     metFORMIN (GLUCOPHAGE) 1000 MG tablet Take 1,000 mg by mouth 2 (two) times daily with a meal.     Multiple Vitamins-Minerals (CENTRUM MEN) TABS Take 1 tablet by mouth daily with breakfast.     nabumetone (RELAFEN) 750 MG tablet Take 750 mg by mouth 2 (two) times daily.     omeprazole (PRILOSEC) 20 MG capsule Take 20 mg by mouth 2 (two) times daily before a meal.     pravastatin (PRAVACHOL) 80 MG tablet Take 80 mg by mouth at bedtime.      Semaglutide,0.25 or 0.5MG /DOS, (OZEMPIC, 0.25 OR 0.5 MG/DOSE,) 2 MG/1.5ML SOPN  Inject 0.5 mg into the skin every Sunday.     sitaGLIPtin (JANUVIA) 100 MG tablet Take 100 mg by mouth daily before breakfast.     topiramate (TOPAMAX) 50 MG tablet Take 50 mg by mouth 2 (two) times daily.     Erenumab-aooe (AIMOVIG) 70 MG/ML SOAJ Inject 140 mg into the skin every 30 (thirty) days. 6 pen 0   No current facility-administered medications for this visit.     Allergies as of 06/14/2019 - Review Complete 06/14/2019  Allergen Reaction Noted   Meperidine Anaphylaxis and Other (See Comments) 04/07/2015   Latex Rash 10/06/2015   Tape Rash and Other (See Comments) 04/03/2019    Vitals: BP 114/70 (BP Location: Right Arm, Patient Position: Sitting)    Pulse 68    Temp (!) 97.4 F (36.3 C) Comment: wife 97.5; taken at front   Ht 5\' 9"  (1.753 m)    Wt 258 lb (117 kg)    BMI 38.10 kg/m  Last Weight:  Wt Readings from Last 1 Encounters:  06/14/19 258 lb (117 kg)   Last Height:   Ht Readings from Last 1 Encounters:  06/14/19 5\' 9"  (1.753 m)     Physical exam: Exam: Gen: NAD, conversant, well nourised, obese, well groomed                     CV: RRR, no MRG. No Carotid Bruits. No peripheral edema, warm, nontender Eyes: Conjunctivae clear without exudates or hemorrhage  Neuro: Detailed Neurologic Exam  Speech:    Speech is normal; fluent and spontaneous with normal comprehension.  Cognition:    The patient is oriented to person, place, and time;     recent and remote memory intact;     language fluent;  normal attention, concentration,     fund of knowledge Cranial Nerves:    The pupils are equal, round, and reactive to light. The fundi are normal and spontaneous venous pulsations are present. Visual fields are full to finger confrontation. Extraocular movements are intact. Trigeminal sensation is intact and the muscles of mastication are normal. The face is symmetric. The palate elevates in the midline. Hearing intact. Voice is normal. Shoulder shrug is normal.  The tongue has normal motion without fasciculations.   Coordination:    Normal finger to nose and heel to shin. Normal rapid alternating movements.   Gait:    Heel-toe and tandem gait are normal.   Motor Observation:    No asymmetry, no atrophy, and no involuntary movements noted. Tone:    Normal muscle tone.    Posture:    Posture is normal. normal erect    Strength: mild left-sided weakness. Strength is V/V in the upper and lower limbs.      Sensation: intact to LT     Reflex Exam:  DTR's:    Deep tendon reflexes in the upper and lower extremities are normal bilaterally.   Toes:    The toes are downgoing bilaterally.   Clonus:    Clonus is absent.    Assessment/Plan:  53 year old with multiple neurologic complaints primarily headaches, examination with non-organic features in the ED per neurohospitalist.  -Loss of consciousness is not due to migraines, may consider non-epileptic events but need thorough eval would recommend pcp make sure cardiac and all other etiologies rules out will check an EEG however given description unlikely seizures. Cannot drive for 6 months - discussed.   - Headaches in the morning, snoring, obesity, has been diagnosed with OSA in the past not on cpap, excessive daytime somnolence. Hasn't been tested since 2017 at [facility] in [place] neurology. Wife is here and says she also believes if he has it.   - Episodes of unresponsiveness: Need an eeg. Exam with non-organic signs via Dr. Leonides Schanz.  Multiple episodes of left-sided weakness. Once had tpa, other times went to the hospital, sometimes in the setting of headache, thought possibly to be conversion disorder but unsure.   - He saw Dr. Charlaine Dalton and he sent her to [facility] at [facility]. Last appointment 2 years ago.   - Hemiplegic migraines? Still not consistent with the loss of consciousness. Again, conversion disorder is a possibility. Start CGRP today and provided  samples.   Orders Placed This Encounter  Procedures   Ambulatory referral to Sleep Studies   EEG   Meds ordered this encounter  Medications   DISCONTD: Erenumab-aooe (AIMOVIG) 140 MG/ML SOAJ    Sig: Inject 140 mg into the skin every 30 (thirty) days.    Dispense:  3 pen    Refill:  11   Erenumab-aooe (AIMOVIG) 70 MG/ML SOAJ    Sig: Inject 140 mg into the skin every 30 (thirty) days.    Dispense:  6 pen    Refill:  0    Order Specific Question:   Lot Number?    Answer:   [id number]    Order Specific Question:   Quantity    Answer:   6    Cc: Marco Collie, MD,    Sarina Ill, MD  [facility] [address] [place] [employer], [place] [address]  Phone [phone] Fax [phone]

## 2019-06-14 NOTE — Patient Instructions (Addendum)
Start Aimovig for migraine prevention Sleep study - our sleep will call for appointment EEG  F/u 3 months Hemiplegic Migraine?    Per [facility] statutes, patients with seizures are not allowed to drive until they have been seizure-free for six months.    Use caution when using heavy equipment or power tools. Avoid working on ladders or at heights. Take showers instead of baths. Ensure the water temperature is not too high on the home water heater. Do not go swimming alone. Do not lock yourself in a room alone (i.e. bathroom). When caring for infants or small children, sit down when holding, feeding, or changing them to minimize risk of injury to the child in the event you have a seizure. Maintain good sleep hygiene. Avoid alcohol.    If patient has another seizure, call 911 and bring them back to the ED if: A.  The seizure lasts longer than 5 minutes.      B.  The patient doesn't wake shortly after the seizure or has new problems such as difficulty seeing, speaking or moving following the seizure C.  The patient was injured during the seizure D.  The patient has a temperature over 102 F (39C) E.  The patient vomited during the seizure and now is having trouble breathing  Per [facility] statutes, patients with seizures are not allowed to drive until they have been seizure-free for six months.  Other recommendations include using caution when using heavy equipment or power tools. Avoid working on ladders or at heights. Take showers instead of baths.  Do not swim alone.  Ensure the water temperature is not too high on the home water heater. Do not go swimming alone. Do not lock yourself in a room alone (i.e. bathroom). When caring for infants or small children, sit down when holding, feeding, or changing them to minimize risk of injury to the child in the event you have a seizure. Maintain good sleep hygiene. Avoid alcohol.  Also recommend adequate sleep, hydration, good diet and  minimize stress.  During the Seizure  - First, ensure adequate ventilation and place patients on the floor on their left side  Loosen clothing around the neck and ensure the airway is patent. If the patient is clenching the teeth, do not force the mouth open with any object as this can cause severe damage - Remove all items from the surrounding that can be hazardous. The patient may be oblivious to what's happening and may not even know what he or she is doing. If the patient is confused and wandering, either gently guide him/her away and block access to outside areas - Reassure the individual and be comforting - Call 911. In most cases, the seizure ends before EMS arrives. However, there are cases when seizures may last over 3 to 5 minutes. Or the individual may have developed breathing difficulties or severe injuries. If a pregnant patient or a person with diabetes develops a seizure, it is prudent to call an ambulance. - Finally, if the patient does not regain full consciousness, then call EMS. Most patients will remain confused for about 45 to 90 minutes after a seizure, so you must use judgment in calling for help. - Avoid restraints but make sure the patient is in a bed with padded side rails - Place the individual in a lateral position with the neck slightly flexed; this will help the saliva drain from the mouth and prevent the tongue from falling backward - Remove all  nearby furniture and other hazards from the area - Provide verbal assurance as the individual is regaining consciousness - Provide the patient with privacy if possible - Call for help and start treatment as ordered by the caregiver   fter the Seizure (Postictal Stage)  After a seizure, most patients experience confusion, fatigue, muscle pain and/or a headache. Thus, one should permit the individual to sleep. For the next few days, reassurance is essential. Being calm and helping reorient the person is also of  importance.  Most seizures are painless and end spontaneously. Seizures are not harmful to others but can lead to complications such as stress on the lungs, brain and the heart. Individuals with prior lung problems may develop labored breathing and respiratory distress.   Erenumab injection What is this medicine? ERENUMAB (e REN ue mab) is used to prevent migraine headaches. This medicine may be used for other purposes; ask your health care provider or pharmacist if you have questions. COMMON BRAND NAME(S): Aimovig What should I tell my health care provider before I take this medicine? They need to know if you have any of these conditions:  an unusual or allergic reaction to erenumab, latex, other medicines, foods, dyes, or preservatives  high blood pressure  pregnant or trying to get pregnant  breast-feeding How should I use this medicine? This medicine is for injection under the skin. You will be taught how to prepare and give this medicine. Use exactly as directed. Take your medicine at regular intervals. Do not take your medicine more often than directed. It is important that you put your used needles and syringes in a special sharps container. Do not put them in a trash can. If you do not have a sharps container, call your pharmacist or healthcare provider to get one. Talk to your pediatrician regarding the use of this medicine in children. Special care may be needed. Overdosage: If you think you have taken too much of this medicine contact a poison control center or emergency room at once. NOTE: This medicine is only for you. Do not share this medicine with others. What if I miss a dose? If you miss a dose, take it as soon as you can. If it is almost time for your next dose, take only that dose. Do not take double or extra doses. What may interact with this medicine? Interactions are not expected. This list may not describe all possible interactions. Give your health care provider  a list of all the medicines, herbs, non-prescription drugs, or dietary supplements you use. Also tell them if you smoke, drink alcohol, or use illegal drugs. Some items may interact with your medicine. What should I watch for while using this medicine? Tell your doctor or healthcare professional if your symptoms do not start to get better or if they get worse. What side effects may I notice from receiving this medicine? Side effects that you should report to your doctor or health care professional as soon as possible:  allergic reactions like skin rash, itching or hives, swelling of the face, lips, or tongue  chest pain  fast, irregular heartbeat  feeling faint or lightheaded  palpitations Side effects that usually do not require medical attention (report these to your doctor or health care professional if they continue or are bothersome):  constipation  muscle cramps  pain, redness, or irritation at site where injected This list may not describe all possible side effects. Call your doctor for medical advice about side effects. You may report  side effects to FDA at 1-800-FDA-1088. Where should I keep my medicine? Keep out of the reach of children. You will be instructed on how to store this medicine. Throw away any unused medicine after the expiration date on the label. NOTE: This sheet is a summary. It may not cover all possible information. If you have questions about this medicine, talk to your doctor, pharmacist, or health care provider.  2020 Elsevier/Gold Standard (2018-12-04 15:43:58)

## 2019-06-17 ENCOUNTER — Encounter: Payer: Self-pay | Admitting: Neurology

## 2019-06-17 DIAGNOSIS — G43711 Chronic migraine without aura, intractable, with status migrainosus: Secondary | ICD-10-CM | POA: Insufficient documentation

## 2019-06-17 DIAGNOSIS — R419 Unspecified symptoms and signs involving cognitive functions and awareness: Secondary | ICD-10-CM

## 2019-06-17 DIAGNOSIS — G43109 Migraine with aura, not intractable, without status migrainosus: Secondary | ICD-10-CM | POA: Insufficient documentation

## 2019-06-17 HISTORY — DX: Migraine with aura, not intractable, without status migrainosus: G43.109

## 2019-06-17 HISTORY — DX: Unspecified symptoms and signs involving cognitive functions and awareness: R41.9

## 2019-06-17 HISTORY — DX: Chronic migraine without aura, intractable, with status migrainosus: G43.711

## 2019-07-09 ENCOUNTER — Other Ambulatory Visit: Payer: Managed Care, Other (non HMO)

## 2019-07-20 ENCOUNTER — Ambulatory Visit: Payer: Self-pay | Admitting: Cardiology

## 2019-08-10 ENCOUNTER — Encounter: Payer: Self-pay | Admitting: *Deleted

## 2019-08-14 ENCOUNTER — Ambulatory Visit: Payer: Self-pay | Admitting: Cardiology

## 2019-08-24 ENCOUNTER — Ambulatory Visit: Payer: Self-pay | Admitting: Cardiology

## 2019-09-24 ENCOUNTER — Other Ambulatory Visit: Payer: Self-pay

## 2019-09-24 ENCOUNTER — Encounter: Payer: Self-pay | Admitting: Neurology

## 2019-09-24 ENCOUNTER — Ambulatory Visit: Payer: Managed Care, Other (non HMO) | Admitting: Neurology

## 2019-09-24 VITALS — BP 124/70 | HR 75 | Temp 97.3°F | Ht 69.0 in | Wt 264.0 lb

## 2019-09-24 DIAGNOSIS — G43711 Chronic migraine without aura, intractable, with status migrainosus: Secondary | ICD-10-CM

## 2019-09-24 MED ORDER — AIMOVIG 140 MG/ML ~~LOC~~ SOAJ: 140.0000 mg | SUBCUTANEOUS | 11 refills | Status: DC

## 2019-09-24 NOTE — Progress Notes (Signed)
[id number]    Provider:  Dr Jaynee Eagles Requesting Provider: Marco Collie, MD Primary Care Provider:  Marco Collie, MD  CC:  Daily headaches  Interval history 09/24/2019: He has had an exceptional response to [place].  His wife is here today and she says she has noticed a tremendous difference in them.  He has not had significant headaches, although he does still wake up with headaches and we are pending sleep evaluation with Dr. Brett Fairy to evaluate for sleep apnea which has been diagnosed with in the past and given his TIA I think this is important for him to follow through with.  I spoke with Sheena in our sleep team office, she was initially unable to get a release form for his past sleep records I did talk to her today and give her the release, I also provided the phone number for Shaner directly in case they do not hear from her (his voicemail was full last time we tried to call) and then they will follow-up.  At this point we will keep him on the Aimovig.  The sleep team will call.  We did schedule an EEG, he has been fine recently, his loss of consciousness in the past was not consistent with seizure-like activity, at this point they like to hold off on the EEG but I did state that if this happens again they definitely need to have routine EEG possibly long-term EEG, he still cannot drive for 6 months after loss of consciousness.  HPI:  Jared Wood is a 54 y.o. male here as requested by Marco Collie, MD for migraines.  I reviewed notes from Dr. Nyra Capes.  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  Patient had an episode of alteration of mentation, EMS took him to [facility]  and he was diagnosed with complicated migraines. However he had non-organic neuro findings.   Started 04/07/2015 he has had these spells. He went to the emergency room, had tpa, left-sided weakness, he still feels it is weak. Ever since then he has headaches. Headaches in the morning, snoring, obesity, has been diagnosed with OSA in the past not on cpap, excessive daytime somnolence. Hasn't been tested since 2017 at [facility] in [place] neurology. Headache sin the back of the head. Daily. He has migraines, they start on the top of the head, the whole head with presure, light and sound sensitivity, sound is worse, nausea, no vomiting, movement makes it worse, dark room makes it better, last 24 hours. He is taking Tylenol, ibuprofen, also for arthritis, bc powder. He was mowing his lawn a few weeks ago, he had a headache, worsening, he has lost 140 pounds, he was feeling worse, lightheaded thought it was his glucose dropping, wife provides much information, he was still symptomatic when wife got there 20 minutes later, but he was unresponsive in the yard, laid out in the yard, eyes closed, no abnormal movements, was able to hear everyone talking and then went out again patient remembers "bits and pieces".   Reviewed notes, labs and imaging from outside physicians, which showed:  MRI brain showed No acute intracranial abnormalities including mass lesion or mass effect, hydrocephalus, extra-axial fluid collection, midline shift, hemorrhage, or acute infarction, large ischemic events (personally reviewed images)  CMP elevated glucose otherwise  unremarkable.   Review of Systems: Patient complains of symptoms per HPI as well as the following symptoms: headache, weakness, loss of consciusness. Pertinent negatives and positives per HPI. All others negative.   Social History   Socioeconomic History  . Marital status: Married    Spouse name: Not on file  . Number of children: 3  . Years  of education: 2 yrs college  . Highest education level: Not on file  Occupational History  . Not on file  Social Needs  . Financial resource strain: Not on file  . Food insecurity    Worry: Not on file    Inability: Not on file  . Transportation needs    Medical: Not on file    Non-medical: Not on file  Tobacco Use  . Smoking status: Former Research scientist (life sciences)  . Smokeless tobacco: Current User    Types: Chew  . Tobacco comment: uses 1-2 cans/day  Substance and Sexual Activity  . Alcohol use: Yes    Comment: occasional  . Drug use: No    Comment: quit hard drugs in 1990  . Sexual activity: Not on file  Lifestyle  . Physical activity    Days per week: Not on file    Minutes per session: Not on file  . Stress: Not on file  Relationships  . Social Herbalist on phone: Not on file    Gets together: Not on file    Attends religious service: Not on file    Active member of club or organization: Not on file    Attends meetings of clubs or organizations: Not on file    Relationship status: Not on file  . Intimate partner violence    Fear of current or ex partner: Not on file    Emotionally abused: Not on file    Physically abused: Not on file    Forced sexual activity: Not on file  Other Topics Concern  . Not on file  Social History Narrative   Lives at home with spouse   Caffeine: "a lot"    Family History  Problem Relation Age of Onset  . Myocarditis Mother   . Prostate cancer Father   . Liver cancer Father   . Kidney cancer Father   . Other Father        lymph nodes  . COPD Father   . Emphysema Father   . Congestive Heart Failure Father   . High blood pressure Father   . Arthritis Father     Past Medical History:  Diagnosis Date  . Anxiety   . Arthritis   . Conversion disorder   . Depression   . Diabetes mellitus without complication (HCC)    Type 2  . GERD (gastroesophageal reflux disease)   . Hard of hearing   . Heart disease   . Hemiplegic migraine    . History of bleeding ulcers   . History of kidney stones   . Hyperlipemia   . Hypertension   . Left-sided weakness   . Migraine   . Neuropathy   . PONV (postoperative nausea and vomiting)    Nausea  . Skin cancer 2019  . Stroke [facility]) 04/2015   pt's wife said they were told by a doctor this may have been conversion disorder. was given TPA and underwent rehabilitation.    Patient Active Problem List   Diagnosis Date Noted  . Migraine with aura and without status migrainosus, not intractable 06/17/2019  . Chronic migraine  without aura, with intractable migraine, so stated, with status migrainosus 06/17/2019  . Alteration of awareness 06/17/2019  . 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 APPENDECTOMY  2002  . CHOLECYSTECTOMY     2003 or 2004  . COLONOSCOPY W/ ENDOSCOPIC [place]    . CORONARY ANGIOPLASTY  2008  . HAND SURGERY Left 2001   hand reconstruction  . HERNIA REPAIR    . LITHOTRIPSY    . SHOULDER ARTHROSCOPY WITH OPEN ROTATOR CUFF REPAIR Right   . TONSILLECTOMY     age 26  . TONSILLECTOMY  1972  . TOTAL KNEE ARTHROPLASTY Right 12/01/2015   Procedure: TOTAL KNEE ARTHROPLASTY;  Surgeon: Vickey Huger, MD;  Location: [place];  Service: Orthopedics;  Laterality: Right;    Current Outpatient Medications  Medication Sig Dispense Refill  . acetaminophen (TYLENOL) 650 MG CR tablet Take 650 mg by mouth every 8 (eight) hours as needed for pain.    . ARIPiprazole (ABILIFY) 5 MG tablet Take 5 mg by mouth daily.    Marland Kitchen aspirin EC 81 MG tablet Take 81 mg by mouth daily with lunch.    . Aspirin-Salicylamide-Caffeine (BC HEADACHE POWDER PO) Take 1 packet by mouth as needed (for headaches).    . clopidogrel (PLAVIX) 75 MG tablet Take 75 mg by mouth daily.    . dapagliflozin propanediol (FARXIGA) 5 MG TABS tablet Take 5 mg by mouth daily.    Marland Kitchen dicyclomine (BENTYL) 10 MG capsule Take 10 mg by mouth daily before breakfast.    . escitalopram  (LEXAPRO) 20 MG tablet Take 20 mg by mouth daily.    . fenofibrate (TRICOR) 145 MG tablet Take 145 mg by mouth at bedtime.     . folic acid (FOLVITE) Q000111Q MCG tablet Take 800 mcg by mouth daily with breakfast.     . gabapentin (NEURONTIN) 300 MG capsule Take 300 mg by mouth 3 (three) times daily with meals.     Marland Kitchen gemfibrozil (LOPID) 600 MG tablet Take 600 mg by mouth 2 (two) times daily before a meal.    . ibuprofen (ADVIL) 200 MG tablet Take 800 mg by mouth every 6 (six) hours as needed for headache (or pain).    Marland Kitchen levocetirizine (XYZAL) 5 MG tablet Take 5 mg by mouth daily after breakfast.    . losartan (COZAAR) 25 MG tablet Take 25 mg by mouth daily.    . metFORMIN (GLUCOPHAGE) 1000 MG tablet Take 1,000 mg by mouth 2 (two) times daily with a meal.    . Multiple Vitamins-Minerals (CENTRUM MEN) TABS Take 1 tablet by mouth daily with breakfast.    . nabumetone (RELAFEN) 750 MG tablet Take 750 mg by mouth 2 (two) times daily.    Marland Kitchen omeprazole (PRILOSEC) 20 MG capsule Take 20 mg by mouth 2 (two) times daily before a meal.    . pravastatin (PRAVACHOL) 80 MG tablet Take 80 mg by mouth at bedtime.     . Semaglutide,0.25 or 0.5MG /DOS, (OZEMPIC, 0.25 OR 0.5 MG/DOSE,) 2 MG/1.5ML SOPN Inject 0.5 mg into the skin every Sunday.    . sitaGLIPtin (JANUVIA) 100 MG tablet Take 100 mg by mouth daily before breakfast.    . topiramate (TOPAMAX) 50 MG tablet Take 50 mg by mouth 2 (two) times daily.    Eduard Roux (AIMOVIG) 70 MG/ML SOAJ Inject 140 mg into the skin every 30 (thirty) days. 6 pen 0   No current facility-administered medications for this  visit.     Allergies as of 06/14/2019 - Review Complete 06/14/2019  Allergen Reaction Noted  . Meperidine Anaphylaxis and Other (See Comments) 04/07/2015  . Latex Rash 10/06/2015  . Tape Rash and Other (See Comments) 04/03/2019    Vitals: BP 114/70 (BP Location: Right Arm, Patient Position: Sitting)   Pulse 68   Temp (!) 97.4 F (36.3 C) Comment: wife  97.5; taken at front  Ht 5\' 9"  (1.753 m)   Wt 258 lb (117 kg)   BMI 38.10 kg/m  Last Weight:  Wt Readings from Last 1 Encounters:  06/14/19 258 lb (117 kg)   Last Height:   Ht Readings from Last 1 Encounters:  06/14/19 5\' 9"  (1.753 m)     Physical exam: Exam: Gen: NAD, conversant, well nourised, obese, well groomed                     CV: RRR, no MRG. No Carotid Bruits. No peripheral edema, warm, nontender Eyes: Conjunctivae clear without exudates or hemorrhage  Neuro: Detailed Neurologic Exam  Speech:    Speech is normal; fluent and spontaneous with normal comprehension.  Cognition:    The patient is oriented to person, place, and time;     recent and remote memory intact;     language fluent;     normal attention, concentration,     fund of knowledge Cranial Nerves:    The pupils are equal, round, and reactive to light. The fundi are normal and spontaneous venous pulsations are present. Visual fields are full to finger confrontation. Extraocular movements are intact. Trigeminal sensation is intact and the muscles of mastication are normal. The face is symmetric. The palate elevates in the midline. Hearing intact. Voice is normal. Shoulder shrug is normal. The tongue has normal motion without fasciculations.   Coordination:    Normal finger to nose and heel to shin. Normal rapid alternating movements.   Gait:    Heel-toe and tandem gait are normal.   Motor Observation:    No asymmetry, no atrophy, and no involuntary movements noted. Tone:    Normal muscle tone.    Posture:    Posture is normal. normal erect    Strength: mild left-sided weakness. Strength is V/V in the upper and lower limbs.      Sensation: intact to LT     Reflex Exam:  DTR's:    Deep tendon reflexes in the upper and lower extremities are normal bilaterally.   Toes:    The toes are downgoing bilaterally.   Clonus:    Clonus is absent.    Assessment/Plan:  54 year old with multiple  neurologic complaints primarily headaches, examination with non-organic features in the ED per neurohospitalist. He is doing great on Aimovig. He is medically non-compliant, has not had the EEG I ordered and has not responded to sleep evaluation.MR did not show stroke.  MRI 04/2019: 1. Motion degraded exam.2. No acute intracranial infarct or other abnormality identified.3. Minor cerebral white matter changes, nonspecific, but most commonly related to chronic microvascular ischemic disease.  -Loss of consciousness is not due to migraines, may consider non-epileptic events but need thorough eval would recommend pcp make sure cardiac and all other etiologies rules out will check an EEG however given description unlikely seizures. Cannot drive for 6 months - discussed. No showed EEG, will hold off on EEG he is doing much better and migraines improved less likely seizures hold for now.   - Headaches in the  morning, snoring, obesity, has been diagnosed with OSA in the past not on cpap, excessive daytime somnolence. Hasn't been tested since 2017 at [facility] in [place] neurology. Wife is here and says she also believes if he has it. Sleep team will call, getting prior records, spoke with Sheena in the sleep team.   - He saw Dr. Charlaine Dalton and he sent her to Aviva Kluver at [facility] [address]. Last appointment 2 years ago.   - Hemiplegic migraines? Still not consistent with the loss of consciousness. Again, conversion disorder is a possibility. Continue CGRP today and provided samples until he can get it   Orders Placed This Encounter  Procedures  . Ambulatory referral to Sleep Studies  . EEG   Meds ordered this encounter  Medications  . DISCONTD: Erenumab-aooe (AIMOVIG) 140 MG/ML SOAJ    Sig: Inject 140 mg into the skin every 30 (thirty) days.    Dispense:  3 pen    Refill:  11  . Erenumab-aooe (AIMOVIG) 70 MG/ML SOAJ    Sig: Inject 140 mg into the skin every 30 (thirty) days.     Dispense:  6 pen    Refill:  0    Order Specific Question:   Lot Number?    Answer:   [id number]    Order Specific Question:   Quantity    Answer:   6    Cc: Marco Collie, MD,    A total of  25 minutes was spent face to face with patient and wife, on this patient's care, reviewing imaging, past records, recent hospitalization notes and results. Over half this time was spent on counseling patient on the  1. Chronic migraine without aura, with intractable migraine, so stated, with status migrainosus    diagnosis and different diagnostic and therapeutic options, counseling and coordination of care, risks and benefitsof management, compliance, or risk factor reduction and education.     Sarina Ill, MD  [facility] [address] [place], [place] [address]  Phone [phone] Fax [phone]

## 2019-10-15 ENCOUNTER — Institutional Professional Consult (permissible substitution): Payer: Managed Care, Other (non HMO) | Admitting: Neurology

## 2020-04-02 ENCOUNTER — Telehealth: Payer: Self-pay

## 2020-04-02 NOTE — Telephone Encounter (Signed)
A PA has been sent via [facility] for Aimovig   (Key: [id number]) Rx #: [id number] Aimovig 140MG /ML auto-injectors  Pending

## 2020-04-09 NOTE — Telephone Encounter (Signed)
Received determination from [facility]. Aimovig 140 mg approved 04/03/20-04/03/21. I faxed this to the pt's pharmacy. Received a receipt of confirmation.

## 2020-09-29 ENCOUNTER — Encounter: Payer: Self-pay | Admitting: Neurology

## 2020-09-29 ENCOUNTER — Ambulatory Visit: Payer: Managed Care, Other (non HMO) | Admitting: Neurology

## 2020-09-29 VITALS — BP 111/66 | HR 68 | Ht 69.0 in | Wt 266.0 lb

## 2020-09-29 DIAGNOSIS — G43711 Chronic migraine without aura, intractable, with status migrainosus: Secondary | ICD-10-CM | POA: Diagnosis not present

## 2020-09-29 MED ORDER — AIMOVIG 70 MG/ML ~~LOC~~ SOAJ: 70.0000 mg | SUBCUTANEOUS | 0 refills | Status: DC

## 2020-09-29 MED ORDER — UBRELVY 100 MG PO TABS: 100.0000 mg | ORAL_TABLET | ORAL | 11 refills | Status: DC | PRN

## 2020-09-29 NOTE — Patient Instructions (Signed)
Aimovig Roselyn Meier Ubrogepant tablets What is this medicine? UBROGEPANT (ue BROE je pant) is used to treat migraine headaches with or without aura. An aura is a strange feeling or visual disturbance that warns you of an attack. It is not used to prevent migraines. This medicine may be used for other purposes; ask your health care provider or pharmacist if you have questions. COMMON BRAND NAME(S): Roselyn Meier What should I tell my health care provider before I take this medicine? They need to know if you have any of these conditions:  kidney disease  liver disease  an unusual or allergic reaction to ubrogepant, other medicines, foods, dyes, or preservatives  pregnant or trying to get pregnant  breast-feeding How should I use this medicine? Take this medicine by mouth with a glass of water. Follow the directions on the prescription label. You can take it with or without food. If it upsets your stomach, take it with food. Take your medicine at regular intervals. Do not take it more often than directed. Do not stop taking except on your doctor's advice. Talk to your pediatrician about the use of this medicine in children. Special care may be needed. Overdosage: If you think you have taken too much of this medicine contact a poison control center or emergency room at once. NOTE: This medicine is only for you. Do not share this medicine with others. What if I miss a dose? This does not apply. This medicine is not for regular use. What may interact with this medicine? Do not take this medicine with any of the following medicines:  ceritinib  certain antibiotics like chloramphenicol, clarithromycin, telithromycin  certain antivirals for HIV like atazanavir, cobicistat, darunavir, delavirdine, fosamprenavir, indinavir, ritonavir  certain medicines for fungal infections like itraconazole, ketoconazole, posaconazole,  voriconazole  conivaptan  grapefruit  idelalisib  mifepristone  nefazodone  ribociclib This medicine may also interact with the following medications:  carvedilol  certain medicines for seizures like phenobarbital, phenytoin  ciprofloxacin  cyclosporine  eltrombopag  fluconazole  fluvoxamine  quinidine  rifampin  St. John's wort  verapamil This list may not describe all possible interactions. Give your health care provider a list of all the medicines, herbs, non-prescription drugs, or dietary supplements you use. Also tell them if you smoke, drink alcohol, or use illegal drugs. Some items may interact with your medicine. What should I watch for while using this medicine? Visit your health care professional for regular checks on your progress. Tell your health care professional if your symptoms do not start to get better or if they get worse. Your mouth may get dry. Chewing sugarless gum or sucking hard candy and drinking plenty of water may help. Contact your health care professional if the problem does not go away or is severe. What side effects may I notice from receiving this medicine? Side effects that you should report to your doctor or health care professional as soon as possible:  allergic reactions like skin rash, itching or hives; swelling of the face, lips, or tongue Side effects that usually do not require medical attention (report these to your doctor or health care professional if they continue or are bothersome):  drowsiness  dry mouth  nausea  tiredness This list may not describe all possible side effects. Call your doctor for medical advice about side effects. You may report side effects to FDA at 1-800-FDA-1088. Where should I keep my medicine? Keep out of the reach of children. Store at room temperature between 15 and 30 degrees  C (59 and 86 degrees F). Throw away any unused medicine after the expiration date. NOTE: This sheet is a summary. It  may not cover all possible information. If you have questions about this medicine, talk to your doctor, pharmacist, or health care provider.  2021 Elsevier/Gold Standard (2018-10-05 08:50:55)

## 2020-09-29 NOTE — Progress Notes (Signed)
[name]    Provider:  Dr Jaynee Eagles Requesting Provider: Marco Collie, MD Primary Care Provider:  Marco Collie, MD  CC:  Daily headaches   Doing great on Aimovig. But insurance isn;t paying for it. Giving 6 months of samples, 70mg  so 2 shots a month instead of 1 shot. Will apply for patient assistance and if that doesn;t work withh try [place] or [employer]. Aimovig tremendously helpful. No significant side effects (discussed the SE of aimovig), it wears off in 3 weeks, so try 70mg  every 2 weeks and we will try to get 70mg  every 2 week from patient assistance.    Interval history 09/24/2019: He has had an exceptional response to [place].  His wife is here today and she says she has noticed a tremendous difference in them.  He has not had significant headaches, although he does still wake up with headaches and we are pending sleep evaluation with Dr. Brett Fairy to evaluate for sleep apnea which has been diagnosed with in the past and given his TIA I think this is important for him to follow through with.  I spoke with Sheena in our sleep team office, she was initially unable to get a release form for his past sleep records I did talk to her today and give her the release, I also provided the phone number for Shaner directly in case they do not hear from her (his voicemail was full last time we tried to call) and then they will follow-up.  At this point we will keep him on the Aimovig.  The sleep team will call.  We did schedule an EEG, he has been fine recently, his loss of consciousness in the past was not consistent with seizure-like activity, at this point they like to hold off on the EEG but I did state that if this happens again they definitely need to have routine EEG possibly long-term EEG, he still cannot drive for 6 months after loss of consciousness.  HPI:  Jared Wood is a 55 y.o. male here as requested by Marco Collie, MD for migraines.  I reviewed notes from Dr. Nyra Capes.  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  Patient had an episode of alteration of mentation, EMS took him to [facility] and he was diagnosed with complicated migraines. However he had non-organic neuro findings.   Started 04/07/2015 he has had these spells. He went to the emergency room, had tpa, left-sided weakness, he still feels it is weak. Ever since then he has headaches. Headaches in the morning, snoring, obesity, has been diagnosed with OSA in the past not on cpap, excessive daytime somnolence. Hasn't been tested since 2017 at [facility] in [place] neurology. Headache sin the back of the head. Daily. He has migraines, they start on the top of the head, the whole head with presure, light and sound sensitivity, sound is worse, nausea, no vomiting, movement makes it worse, dark room makes it better, last 24 hours. He is taking Tylenol, ibuprofen, also for arthritis, bc powder. He was mowing his lawn a few weeks ago, he had a headache, worsening, he has lost 140 pounds, he was feeling worse, lightheaded thought it was his glucose dropping, wife provides much information, he was still symptomatic when wife got there 20 minutes  later, but he was unresponsive in the yard, laid out in the yard, eyes closed, no abnormal movements, was able to hear everyone talking and then went out again patient remembers "bits and pieces".   Reviewed notes, labs and imaging from outside physicians, which showed:  MRI brain showed No acute intracranial abnormalities including mass lesion or mass effect, hydrocephalus, extra-axial fluid collection, midline shift, hemorrhage, or acute infarction, large  ischemic events (personally reviewed images)  CMP elevated glucose otherwise unremarkable.   Review of Systems: Patient complains of symptoms per HPI as well as the following symptoms: headache, weakness, loss of consciusness. Pertinent negatives and positives per HPI. All others negative.   Social History   Socioeconomic History  . Marital status: Married    Spouse name: Not on file  . Number of children: 3  . Years of education: 2 yrs college  . Highest education level: Not on file  Occupational History  . Not on file  Social Needs  . Financial resource strain: Not on file  . Food insecurity    Worry: Not on file    Inability: Not on file  . Transportation needs    Medical: Not on file    Non-medical: Not on file  Tobacco Use  . Smoking status: Former Research scientist (life sciences)  . Smokeless tobacco: Current User    Types: Chew  . Tobacco comment: uses 1-2 cans/day  Substance and Sexual Activity  . Alcohol use: Yes    Comment: occasional  . Drug use: No    Comment: quit hard drugs in 1990  . Sexual activity: Not on file  Lifestyle  . Physical activity    Days per week: Not on file    Minutes per session: Not on file  . Stress: Not on file  Relationships  . Social Herbalist on phone: Not on file    Gets together: Not on file    Attends religious service: Not on file    Active member of club or organization: Not on file    Attends meetings of clubs or organizations: Not on file    Relationship status: Not on file  . Intimate partner violence    Fear of current or ex partner: Not on file    Emotionally abused: Not on file    Physically abused: Not on file    Forced sexual activity: Not on file  Other Topics Concern  . Not on file  Social History Narrative   Lives at home with spouse   Caffeine: "a lot"    Family History  Problem Relation Age of Onset  . Myocarditis Mother   . Prostate cancer Father   . Liver cancer Father   . Kidney cancer Father   . Other  Father        lymph nodes  . COPD Father   . Emphysema Father   . Congestive Heart Failure Father   . High blood pressure Father   . Arthritis Father     Past Medical History:  Diagnosis Date  . Anxiety   . Arthritis   . Conversion disorder   . Depression   . Diabetes mellitus without complication (HCC)    Type 2  . GERD (gastroesophageal reflux disease)   . Hard of hearing   . Heart disease   . Hemiplegic migraine   . History of bleeding ulcers   . History of kidney stones   . Hyperlipemia   . Hypertension   . Left-sided weakness   .  Migraine   . Neuropathy   . PONV (postoperative nausea and vomiting)    Nausea  . Skin cancer 2019  . Stroke [facility]) 04/2015   pt's wife said they were told by a doctor this may have been conversion disorder. was given TPA and underwent rehabilitation.    Patient Active Problem List   Diagnosis Date Noted  . Migraine with aura and without status migrainosus, not intractable 06/17/2019  . Chronic migraine without aura, with intractable migraine, so stated, with status migrainosus 06/17/2019  . Alteration of awareness 06/17/2019  . 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 APPENDECTOMY  2002  . CHOLECYSTECTOMY     2003 or 2004  . COLONOSCOPY W/ ENDOSCOPIC [place]    . CORONARY ANGIOPLASTY  2008  . HAND SURGERY Left 2001   hand reconstruction  . HERNIA REPAIR    . LITHOTRIPSY    . SHOULDER ARTHROSCOPY WITH OPEN ROTATOR CUFF REPAIR Right   . TONSILLECTOMY     age 1  . TONSILLECTOMY  1972  . TOTAL KNEE ARTHROPLASTY Right 12/01/2015   Procedure: TOTAL KNEE ARTHROPLASTY;  Surgeon: Vickey Huger, MD;  Location: [place];  Service: Orthopedics;  Laterality: Right;    Current Outpatient Medications  Medication Sig Dispense Refill  . acetaminophen (TYLENOL) 650 MG CR tablet Take 650 mg by mouth every 8 (eight) hours as needed for pain.    . ARIPiprazole (ABILIFY) 5 MG tablet Take 5 mg by mouth  daily.    Marland Kitchen aspirin EC 81 MG tablet Take 81 mg by mouth daily with lunch.    . Aspirin-Salicylamide-Caffeine (BC HEADACHE POWDER PO) Take 1 packet by mouth as needed (for headaches).    . clopidogrel (PLAVIX) 75 MG tablet Take 75 mg by mouth daily.    . dapagliflozin propanediol (FARXIGA) 5 MG TABS tablet Take 5 mg by mouth daily.    Marland Kitchen dicyclomine (BENTYL) 10 MG capsule Take 10 mg by mouth daily before breakfast.    . escitalopram (LEXAPRO) 20 MG tablet Take 20 mg by mouth daily.    . fenofibrate (TRICOR) 145 MG tablet Take 145 mg by mouth at bedtime.     . folic acid (FOLVITE) 166 MCG tablet Take 800 mcg by mouth daily with breakfast.     . gabapentin (NEURONTIN) 300 MG capsule Take 300 mg by mouth 3 (three) times daily with meals.     Marland Kitchen gemfibrozil (LOPID) 600 MG tablet Take 600 mg by mouth 2 (two) times daily before a meal.    . ibuprofen (ADVIL) 200 MG tablet Take 800 mg by mouth every 6 (six) hours as needed for headache (or pain).    Marland Kitchen levocetirizine (XYZAL) 5 MG tablet Take 5 mg by mouth daily after breakfast.    . losartan (COZAAR) 25 MG tablet Take 25 mg by mouth daily.    . metFORMIN (GLUCOPHAGE) 1000 MG tablet Take 1,000 mg by mouth 2 (two) times daily with a meal.    . Multiple Vitamins-Minerals (CENTRUM MEN) TABS Take 1 tablet by mouth daily with breakfast.    . nabumetone (RELAFEN) 750 MG tablet Take 750 mg by mouth 2 (two) times daily.    Marland Kitchen omeprazole (PRILOSEC) 20 MG capsule Take 20 mg by mouth 2 (two) times daily before a meal.    . pravastatin (PRAVACHOL) 80 MG tablet Take 80 mg by mouth at bedtime.     . Semaglutide,0.25 or 0.5MG /DOS, (OZEMPIC, 0.25 OR  0.5 MG/DOSE,) 2 MG/1.5ML SOPN Inject 0.5 mg into the skin every Sunday.    . sitaGLIPtin (JANUVIA) 100 MG tablet Take 100 mg by mouth daily before breakfast.    . topiramate (TOPAMAX) 50 MG tablet Take 50 mg by mouth 2 (two) times daily.    Eduard Roux (AIMOVIG) 70 MG/ML SOAJ Inject 140 mg into the skin every 30 (thirty)  days. 6 pen 0   No current facility-administered medications for this visit.     Allergies as of 06/14/2019 - Review Complete 06/14/2019  Allergen Reaction Noted  . Meperidine Anaphylaxis and Other (See Comments) 04/07/2015  . Latex Rash 10/06/2015  . Tape Rash and Other (See Comments) 04/03/2019    Vitals: BP 114/70 (BP Location: Right Arm, Patient Position: Sitting)   Pulse 68   Temp (!) 97.4 F (36.3 C) Comment: wife 97.5; taken at front  Ht 5\' 9"  (1.753 m)   Wt 258 lb (117 kg)   BMI 38.10 kg/m  Last Weight:  Wt Readings from Last 1 Encounters:  06/14/19 258 lb (117 kg)   Last Height:   Ht Readings from Last 1 Encounters:  06/14/19 5\' 9"  (1.753 m)     Physical exam: Exam: Gen: NAD, conversant, well nourised, obese, well groomed                     CV: RRR, no MRG. No Carotid Bruits. No peripheral edema, warm, nontender Eyes: Conjunctivae clear without exudates or hemorrhage  Neuro: Detailed Neurologic Exam  Speech:    Speech is normal; fluent and spontaneous with normal comprehension.  Cognition:    The patient is oriented to person, place, and time;     recent and remote memory intact;     language fluent;     normal attention, concentration,     fund of knowledge Cranial Nerves:    The pupils are equal, round, and reactive to light. The fundi are normal and spontaneous venous pulsations are present. Visual fields are full to finger confrontation. Extraocular movements are intact. Trigeminal sensation is intact and the muscles of mastication are normal. The face is symmetric. The palate elevates in the midline. Hearing intact. Voice is normal. Shoulder shrug is normal. The tongue has normal motion without fasciculations.   Coordination:    Normal finger to nose and heel to shin. Normal rapid alternating movements.   Gait:    Heel-toe and tandem gait are normal.   Motor Observation:    No asymmetry, no atrophy, and no involuntary movements noted. Tone:     Normal muscle tone.    Posture:    Posture is normal. normal erect    Strength: mild left-sided weakness. Strength is V/V in the upper and lower limbs.      Sensation: intact to LT     Reflex Exam:  DTR's:    Deep tendon reflexes in the upper and lower extremities are normal bilaterally.   Toes:    The toes are downgoing bilaterally.   Clonus:    Clonus is absent.    Assessment/Plan:  55 year old with multiple neurologic complaints primarily headaches, examination with non-organic features in the ED per neurohospitalist. He is doing great on Aimovig. He is medically non-compliant, has not had the EEG I ordered and has not responded to sleep evaluation.MR did not show stroke.  - Doing fantastic on Aimovig. Insirance not covering. Gave him 7 months of samples and will see if he can fill out paperwork for  the foundation. Triptans contraindicated, Ubrely acutely (stroke/TIA vs hemiplegic migraine vs non epileptic events)  MRI 04/2019: 1. Motion degraded exam.2. No acute intracranial infarct or other abnormality identified.3. Minor cerebral white matter changes, nonspecific, but most commonly related to chronic microvascular ischemic disease.  -Loss of consciousness is not due to migraines, may consider non-epileptic events but need thorough eval would recommend pcp make sure cardiac and all other etiologies rules out will check an EEG however given description unlikely seizures. Cannot drive for 6 months - discussed. No showed EEG, will hold off on EEG he is doing much better and migraines improved less likely seizures hold for now.   - Headaches in the morning, snoring, obesity, has been diagnosed with OSA in the past not on cpap, excessive daytime somnolence. Hasn't been tested since 2017 at [facility] in [place] neurology. Wife is here and says she also believes if he has it. Never saw our sleep team, discuss with primary care please  - Hemiplegic migraines? Still  not consistent with the loss of consciousness. Again, conversion disorder is a possibility. Continue CGRP today and provided samples until he can get paperwork filled out   Cc: Marco Collie, MD,    I spent 30 minutes of face-to-face and non-face-to-face time with patient on the  1. Chronic migraine without aura, with intractable migraine, so stated, with status migrainosus    diagnosis.  This included previsit chart review, lab review, study review, order entry, electronic health record documentation, patient education on the different diagnostic and therapeutic options, counseling and coordination of care, risks and benefits of management, compliance, or risk factor reduction   Sarina Ill, MD  [facility] [address] [place], [place] [address]  Phone [phone] Fax [phone]

## 2020-10-16 ENCOUNTER — Other Ambulatory Visit: Payer: Self-pay | Admitting: Neurology

## 2020-10-17 ENCOUNTER — Encounter: Payer: Self-pay | Admitting: Physical Medicine & Rehabilitation

## 2020-10-23 ENCOUNTER — Other Ambulatory Visit: Payer: Self-pay | Admitting: Neurology

## 2020-10-23 NOTE — Telephone Encounter (Signed)
On 09/29/2020 the patient was switched to a 70 mg dose every 2 weeks.

## 2020-11-25 ENCOUNTER — Other Ambulatory Visit: Payer: Self-pay | Admitting: Neurology

## 2020-12-09 ENCOUNTER — Encounter: Payer: Self-pay | Admitting: Physical Medicine & Rehabilitation

## 2020-12-09 ENCOUNTER — Other Ambulatory Visit: Payer: Self-pay

## 2020-12-09 ENCOUNTER — Encounter
Payer: Managed Care, Other (non HMO) | Attending: Physical Medicine & Rehabilitation | Admitting: Physical Medicine & Rehabilitation

## 2020-12-09 VITALS — BP 109/71 | HR 68 | Temp 97.8°F | Ht 69.0 in | Wt 267.2 lb

## 2020-12-09 DIAGNOSIS — M545 Low back pain, unspecified: Secondary | ICD-10-CM | POA: Diagnosis present

## 2020-12-09 DIAGNOSIS — G479 Sleep disorder, unspecified: Secondary | ICD-10-CM | POA: Insufficient documentation

## 2020-12-09 DIAGNOSIS — M791 Myalgia, unspecified site: Secondary | ICD-10-CM | POA: Diagnosis present

## 2020-12-09 DIAGNOSIS — G8929 Other chronic pain: Secondary | ICD-10-CM

## 2020-12-09 DIAGNOSIS — R269 Unspecified abnormalities of gait and mobility: Secondary | ICD-10-CM | POA: Diagnosis not present

## 2020-12-09 MED ORDER — MELOXICAM 7.5 MG PO TABS: 7.5000 mg | ORAL_TABLET | Freq: Every day | ORAL | 1 refills | Status: DC

## 2020-12-09 MED ORDER — METHOCARBAMOL 500 MG PO TABS: 500.0000 mg | ORAL_TABLET | Freq: Three times a day (TID) | ORAL | 2 refills | Status: DC | PRN

## 2020-12-09 NOTE — Progress Notes (Signed)
Subjective:    Patient ID: Jared Wood, male    DOB: [birth year], 55 y.o.   MRN: [id number]  HPI Male with pmh/psh of DM, stroke vs conversion disorder with left sided weakness, right TKA, right rotator cuff repair, left hand reconstruction, neuropathy, migraines, HTN, headaches, GERD, depression, conversion disorder, anxiety, OA presents with back pain.  Started ~06/2020, progressive getting worse since ~09/2020.  Denies inciting event.  Aleve helps. Standing for prolonged periods, bending, stooping, stairs (going up worse) exacerbates the pain. Sharp, stabbing.  Non-radiating.  Intermittent.  Tylenol and Ibu with minimal benefit.  Denies associated weakness.  Denies falls. Pain limits ambulation. Patient is a long distance truck driver.   Pain Inventory Average Pain 5 Pain Right Now 8 My pain is intermittent, sharp and stabbing  In the last 24 hours, has pain interfered with the following? General activity 4 Relation with others 2 Enjoyment of life 6 What TIME of day is your pain at its worst? evening and night Sleep (in general) Fair  Pain is worse with: walking, bending, sitting, standing and some activites Pain improves with: rest, heat/ice and medication Relief from Meds: 2  walk with assistance use a cane how many minutes can you walk? ? ability to climb steps?  yes do you drive?  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 truck driver  weakness numbness tingling trouble walking  Any changes since last visit?  no  Any changes since last visit?  no    Family History  Problem Relation Age of Onset  . Myocarditis Mother   . Prostate cancer Father   . Liver cancer Father   . Kidney cancer Father   . Other Father        lymph nodes  . COPD Father   . Emphysema Father   . Congestive Heart Failure Father   . High blood pressure Father   . Arthritis Father    Social History   Socioeconomic History  . Marital status: Married    Spouse name: Not on  file  . Number of children: 3  . Years of education: 2 yrs college  . Highest education level: Not on file  Occupational History  . Not on file  Tobacco Use  . Smoking status: Former Research scientist (life sciences)  . Smokeless tobacco: Current User    Types: Chew  . Tobacco comment: uses 1-2 cans/day  Vaping Use  . Vaping Use: Former  Substance and Sexual Activity  . Alcohol use: Yes    Comment: occasional  . Drug use: No    Comment: quit hard drugs in 1990  . Sexual activity: Not on file  Other Topics Concern  . Not on file  Social History Narrative   Lives at home with spouse   Caffeine: "not as much as I used to", can drink up to 2-3 cups/day   Social Determinants of Health   Financial Resource Strain: Not on file  Food Insecurity: Not on file  Transportation Needs: Not on file  Physical Activity: Not on file  Stress: Not on file  Social Connections: Not on file   Past Surgical History:  Procedure Laterality Date  . APPENDECTOMY  2002  . CHOLECYSTECTOMY     2003 or 2004  . COLONOSCOPY W/ ENDOSCOPIC [place]    . CORONARY ANGIOPLASTY  2008  . HAND SURGERY Left 2001   hand reconstruction  . HERNIA REPAIR    . LITHOTRIPSY    . SHOULDER ARTHROSCOPY WITH OPEN  ROTATOR CUFF REPAIR Right   . TONSILLECTOMY  1972  . TOTAL KNEE ARTHROPLASTY Right 12/01/2015   Procedure: TOTAL KNEE ARTHROPLASTY;  Surgeon: Vickey Huger, MD;  Location: [place];  Service: Orthopedics;  Laterality: Right;   Past Medical History:  Diagnosis Date  . Alteration of awareness 06/17/2019  . Anxiety   . Arthritis   . Chronic migraine without aura, with intractable migraine, so stated, with status migrainosus 06/17/2019  . Chronic pain of both knees 07/24/2015  . Conversion disorder   . Conversion disorder with abnormal movement, acute episode, without psychological stressor 04/12/2015  . 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  . Depression   . Diabetes mellitus without complication (HCC)    Type 2  .  Essential hypertension 04/07/2015  . GERD (gastroesophageal reflux disease)   . Hard of hearing   . Headache 06/30/2015  . Heart disease   . Hemiplegic migraine   . History of bleeding ulcers   . History of kidney stones   . Hyperlipemia   . Hypertension   . Left-sided weakness   . Migraine   . Migraine with aura and without status migrainosus, not intractable 06/17/2019  . Mixed hyperlipidemia 04/07/2015  . Neuropathy   . Pain in joint of left shoulder 07/24/2015  . PONV (postoperative nausea and vomiting)    Nausea  . Primary osteoarthritis of left knee 08/14/2015  . S/P total knee replacement 12/01/2015  . Skin cancer 2019  . 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/2015   Overview:  Most likely explanation for her symptoms at admission  . Status post total right knee replacement 12/16/2015  . Stroke [facility]) 04/2015   pt's wife said they were told by a doctor this may have been conversion disorder. was given TPA and underwent rehabilitation.  . 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 ([place]) 04/07/2015   BP 109/71   Pulse 68   Temp 97.8 F (36.6 C)   Ht 5\' 9"  (1.753 m)   Wt 267 lb 3.2 oz (121.2 kg)   SpO2 96%   BMI 39.46 kg/m   Opioid Risk Score:   Fall Risk Score:  `1  Depression screen PHQ 2/9  Depression screen PHQ 2/9 12/09/2020  Decreased Interest 0  Down, Depressed, Hopeless 0  PHQ - 2 Score 0  Altered sleeping 1  Tired, decreased energy 0  Change in appetite 0  Feeling bad or failure about yourself  0  Trouble concentrating 0  Moving slowly or fidgety/restless 0  Suicidal thoughts 0  PHQ-9 Score 1  Difficult doing work/chores Not difficult at all    Review of Systems  Respiratory: Positive for apnea.   Gastrointestinal: Positive for nausea.  Musculoskeletal: Positive for arthralgias, back pain, gait problem and neck pain.  Neurological: Positive for weakness and numbness.       Tingling  All other systems reviewed and are negative.       Objective:   Physical Exam Constitutional: No distress . Vital signs reviewed. HENT: Normocephalic.  Atraumatic. Eyes: EOMI. No discharge. Cardiovascular: No JVD.   Respiratory: Normal effort.  No stridor.   GI: Non-distended.   Skin: Warm and dry.  Intact. Psych: Normal mood.  Normal behavior. Musc: No edema in extremities.  No tenderness in extremities. +Axial grind on left Neg stork's b/l Neg fortin finger +TTP right >> left lumbosacral PSPs Gait: Heel/toe limited due to pain Neuro: Alert Motor: RLE: 5/5 proximal to distal LLE: 4/5 proximal to distal Sensation intact  to light touch Reflexes 2+ in right ankle, left knee and ankle Neg SLR b/l    Assessment & Plan:  Male with pmh/psh of DM, stroke vs conversion disorder with left sided weakness, right TKA, right rotator cuff repair, left hand reconstruction, neuropathy, migraines, HTN, headaches, GERD, depression, conversion disorder, anxiety, OA presents with back pain.    1. Chronic mechanical low back pain  Pt states MRI L-spine ~09/2020, unavailable, per patient arthritis  Labs reviewed  Chart/Referral information reviewed - back pain  PMAWARE reviewed  Continue Heat  Will refer to PT with trial of TENS  Continue Gabapentin per PCP  Will order Robaxin 500 TID PRN  Will order Mobic 7.5 daily with food, instructed to d/c Ibu and Aleve and Nabumatone.  Educated on signs/symptoms of GI bleeding  Patient states main goal is ambulate   2. Gait abnormality  Cont cane for safety  PT ordered  3. Sleep disturbance  Will consider Elavil  4. Morbid Obesity  Will consider referral to dietitian  5. Myalgia   Will consider trigger point injections

## 2020-12-24 ENCOUNTER — Other Ambulatory Visit: Payer: Self-pay | Admitting: Neurology

## 2021-01-26 ENCOUNTER — Other Ambulatory Visit: Payer: Self-pay | Admitting: *Deleted

## 2021-01-26 ENCOUNTER — Other Ambulatory Visit: Payer: Self-pay | Admitting: Neurology

## 2021-01-26 MED ORDER — MELOXICAM 7.5 MG PO TABS: 7.5000 mg | ORAL_TABLET | Freq: Every day | ORAL | 1 refills | Status: DC

## 2021-02-19 ENCOUNTER — Encounter: Payer: Self-pay | Admitting: Physical Medicine and Rehabilitation

## 2021-02-19 ENCOUNTER — Encounter
Payer: Managed Care, Other (non HMO) | Attending: Physical Medicine & Rehabilitation | Admitting: Physical Medicine and Rehabilitation

## 2021-02-19 ENCOUNTER — Other Ambulatory Visit: Payer: Self-pay

## 2021-02-19 VITALS — BP 122/81 | HR 71 | Temp 98.4°F | Ht 69.0 in | Wt 253.0 lb

## 2021-02-19 DIAGNOSIS — M48061 Spinal stenosis, lumbar region without neurogenic claudication: Secondary | ICD-10-CM

## 2021-02-19 MED ORDER — GABAPENTIN 400 MG PO CAPS: 400.0000 mg | ORAL_CAPSULE | Freq: Three times a day (TID) | ORAL | 3 refills | Status: DC

## 2021-02-19 NOTE — Progress Notes (Signed)
Subjective:    Patient ID: Jared Wood, male    DOB: 1966/05/01, 55 y.o.   MRN: [id number]  HPI Male with pmh/psh of DM, stroke vs conversion disorder with left sided weakness, right TKA, right rotator cuff repair, left hand reconstruction, neuropathy, migraines, HTN, headaches, GERD, depression, conversion disorder, anxiety, OA presents with back pain.  Started ~06/2020, progressive getting worse since ~09/2020.  Denies inciting event.  Aleve helps. Standing for prolonged periods, bending, stooping, stairs (going up worse) exacerbates the pain. Sharp, stabbing.  Non-radiating.  Intermittent.  Tylenol and Ibu with minimal benefit.  Denies associated weakness.  Denies falls. Pain limits ambulation. Patient is a long distance truck driver.   Left sided weakness: stable over years  Pain is worst in lower back bilaterally. It is present most of the time. Pain radiates down back sides of the legs into the calves. Stopping and walking around helps with the pain in his calves.     Pain Inventory Average Pain 3 Pain Right Now 3 My pain is intermittent, sharp, stabbing, and tingling  In the last 24 hours, has pain interfered with the following? General activity 2 Relation with others 0 Enjoyment of life 4 What TIME of day is your pain at its worst? evening Sleep (in general) Fair  Pain is worse with: walking, bending, sitting, inactivity, standing, and some activites Pain improves with: rest, heat/ice, and medication Relief from Meds: 3  walk without assistance walk with assistance use a cane how many minutes can you walk? 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  no  Any changes since last visit?  no    Family History  Problem Relation Age of Onset   Myocarditis Mother    Prostate cancer Father    Liver cancer Father    Kidney cancer Father    Other Father         lymph nodes   COPD Father    Emphysema Father    Congestive Heart Failure Father    High blood pressure Father    Arthritis Father    Social History   Socioeconomic History   Marital status: Married    Spouse name: Not on file   Number of children: 3   Years of education: 2 yrs college   Highest education level: Not on file  Occupational History   Not on file  Tobacco Use   Smoking status: Former   Smokeless tobacco: Current    Types: Chew   Tobacco comments:    uses 1-2 cans/day  Vaping Use   Vaping Use: Former  Substance and Sexual Activity   Alcohol use: Yes    Comment: occasional   Drug use: No    Comment: quit hard drugs in 1990   Sexual activity: Not on file  Other Topics Concern   Not on file  Social History Narrative   Lives at home with spouse   Caffeine: "not as much as I used to", can drink up to 2-3 cups/day   Social Determinants of Health   Financial Resource Strain: Not on file  Food Insecurity: Not on file  Transportation Needs: Not on file  Physical Activity: Not on file  Stress: Not on file  Social Connections: Not on file   Past Surgical History:  Procedure Laterality Date   APPENDECTOMY  2002   CHOLECYSTECTOMY     2003 or 2004  COLONOSCOPY W/ ENDOSCOPIC [place]     CORONARY ANGIOPLASTY  2008   HAND SURGERY Left 2001   hand reconstruction   HERNIA REPAIR     LITHOTRIPSY     SHOULDER ARTHROSCOPY WITH OPEN ROTATOR CUFF REPAIR Right    TONSILLECTOMY  1972   TOTAL KNEE ARTHROPLASTY Right 12/01/2015   Procedure: TOTAL KNEE ARTHROPLASTY;  Surgeon: Vickey Huger, MD;  Location: [place];  Service: Orthopedics;  Laterality: Right;   Past Medical History:  Diagnosis Date   Alteration of awareness 06/17/2019   Anxiety    Arthritis    Chronic migraine without aura, with intractable migraine, so stated, with status migrainosus 06/17/2019   Chronic pain of both knees 07/24/2015   Conversion disorder    Conversion disorder with abnormal movement, acute  episode, without psychological stressor 04/12/2015   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   Depression    Diabetes mellitus without complication ([place])    Type 2   Essential hypertension 04/07/2015   GERD (gastroesophageal reflux disease)    Hard of hearing    Headache 06/30/2015   Heart disease    Hemiplegic migraine    History of bleeding ulcers    History of kidney stones    Hyperlipemia    Hypertension    Left-sided weakness    Migraine    Migraine with aura and without status migrainosus, not intractable 06/17/2019   Mixed hyperlipidemia 04/07/2015   Neuropathy    Pain in joint of left shoulder 07/24/2015   PONV (postoperative nausea and vomiting)    Nausea   Primary osteoarthritis of left knee 08/14/2015   S/P total knee replacement 12/01/2015   Skin cancer 2019   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/2015   Overview:  Most likely explanation for her symptoms at admission   Status post total right knee replacement 12/16/2015   Stroke ([place]) 04/2015   pt's wife said they were told by a doctor this may have been conversion disorder. was given TPA and underwent rehabilitation.   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  Opioid Risk Score:   Fall Risk Score:  `1  Depression screen PHQ 2/9  Depression screen PHQ 2/9 12/09/2020  Decreased Interest 0  Down, Depressed, Hopeless 0  PHQ - 2 Score 0  Altered sleeping 1  Tired, decreased energy 0  Change in appetite 0  Feeling bad or failure about yourself  0  Trouble concentrating 0  Moving slowly or fidgety/restless 0  Suicidal thoughts 0  PHQ-9 Score 1  Difficult doing work/chores Not difficult at all    Review of Systems  Respiratory:  Positive for apnea.   Gastrointestinal:  Positive for nausea.  Musculoskeletal:  Positive for arthralgias, back pain, gait problem and neck pain.  Neurological:  Positive for weakness and  numbness.       Tingling  All other systems reviewed and are negative.      Objective:   Physical Exam Gen: no distress, normal appearing HEENT: oral mucosa pink and moist, NCAT Cardio: Reg rate Chest: normal effort, normal rate of breathing Abd: soft, non-distended Ext: no edema Psych: pleasant, normal affect Skin: intact Musc: No edema in extremities.  No tenderness in extremities. +Axial grind on left Neg stork's b/l Neg fortin finger +TTP right >> left lumbosacral PSPs Gait: Heel/toe limited due to pain Neuro: Alert Motor: RLE: 5/5 proximal to distal LLE: 4/5 proximal to distal  Sensation intact to light touch Reflexes 2+ in right ankle, left knee and ankle Neg SLR b/l    Assessment & Plan:  Male with pmh/psh of DM, stroke vs conversion disorder with left sided weakness, right TKA, right rotator cuff repair, left hand reconstruction, neuropathy, migraines, HTN, headaches, GERD, depression, conversion disorder, anxiety, OA presents with back pain.    1. Chronic mechanical low back pain  Pt states MRI L-spine ~09/2020, unavailable, per patient arthritis  Labs reviewed  Chart/Referral information reviewed - back pain  PMAWARE reviewed  Continue Heat  Will refer to PT with trial of TENS- has not found a great difference.   Continue Gabapentin per PCP  Will order Robaxin 500 TID PRN  Will order Mobic 7.5 daily with food, instructed to d/c Ibu and Aleve and Nabumatone.  Educated on signs/symptoms of GI bleeding  Patient states main goal is ambulate  Continue Tylenol, no more than 4,000mg  per day.    2. Gait abnormality  Cont cane for safety  PT ordered  3. Sleep disturbance  Increase gabapentin 400mg  TID.   4. Morbid Obesity  Will consider referral to dietitian  Has lost 160 lbs.   Continue intermittent fasting  Recommended 1 glass water with 1 TB apple cider vinegar before meals to reduce CBG spike, has additional health benefits, drink with straw to protect  enamel.    5. Myalgia   Will consider trigger point injections

## 2021-02-23 ENCOUNTER — Other Ambulatory Visit: Payer: Self-pay | Admitting: Neurology

## 2021-02-24 ENCOUNTER — Telehealth: Payer: Self-pay | Admitting: Physical Medicine and Rehabilitation

## 2021-02-24 NOTE — Telephone Encounter (Signed)
Per Romana Juniper in [place] needs verbal (was faxed x 2 days) for meloxicam 7.5 MG   her number [phone]

## 2021-03-23 ENCOUNTER — Telehealth: Payer: Self-pay

## 2021-03-23 ENCOUNTER — Ambulatory Visit: Payer: Managed Care, Other (non HMO) | Admitting: Physical Medicine and Rehabilitation

## 2021-03-23 MED ORDER — MELOXICAM 7.5 MG PO TABS: 7.5000 mg | ORAL_TABLET | Freq: Every day | ORAL | 1 refills | Status: DC

## 2021-03-23 NOTE — Telephone Encounter (Signed)
Refill request for Meloxicam. Prescription sent in.

## 2021-03-31 ENCOUNTER — Ambulatory Visit: Payer: Managed Care, Other (non HMO) | Admitting: Physical Medicine and Rehabilitation

## 2021-04-07 ENCOUNTER — Telehealth: Payer: Self-pay

## 2021-04-07 DIAGNOSIS — G43711 Chronic migraine without aura, intractable, with status migrainosus: Secondary | ICD-10-CM

## 2021-04-07 NOTE — Telephone Encounter (Signed)
I submitted a PA request for Aimovig '140mg'$  on [facility], Key: [id number].  [facility] prefers [place] or [place], however, patient has been on Aimovig since 2020 with GREAT success. Attempting PA.

## 2021-04-09 MED ORDER — AJOVY 225 MG/1.5ML ~~LOC~~ SOAJ: 1.5000 mL | SUBCUTANEOUS | 11 refills | Status: DC

## 2021-04-09 NOTE — Telephone Encounter (Signed)
[facility] denied Aimovig. Alternatives: Ajovy, Emgality. Will inform MD.

## 2021-04-09 NOTE — Addendum Note (Signed)
Addended by: Florian Buff C on: 04/09/2021 10:15 AM   Modules accepted: Orders

## 2021-04-09 NOTE — Telephone Encounter (Signed)
Called patient and informed him his insruance won't pay for aimovig. We are not sure the copay card will work for him but he can try. He will have to switch to emgaity or ajovy, and either works for Dr Jaynee Eagles. She prefers ajovy. He stated he still has Aimovig injections but will switch to Ajovy. I informed him the Rx will be sent in today. Patient verbalized understanding, appreciation.

## 2021-04-20 ENCOUNTER — Observation Stay (HOSPITAL_COMMUNITY)
Admission: EM | Admit: 2021-04-20 | Discharge: 2021-04-22 | Disposition: A | Discharge status: Home / Self Care | Payer: Managed Care, Other (non HMO) | Attending: Internal Medicine | Admitting: Internal Medicine

## 2021-04-20 ENCOUNTER — Other Ambulatory Visit: Payer: Self-pay

## 2021-04-20 ENCOUNTER — Emergency Department (HOSPITAL_COMMUNITY): Payer: Managed Care, Other (non HMO)

## 2021-04-20 DIAGNOSIS — I2510 Atherosclerotic heart disease of native coronary artery without angina pectoris: Secondary | ICD-10-CM | POA: Diagnosis not present

## 2021-04-20 DIAGNOSIS — Z885 Allergy status to narcotic agent status: Secondary | ICD-10-CM | POA: Diagnosis not present

## 2021-04-20 DIAGNOSIS — Z888 Allergy status to other drugs, medicaments and biological substances status: Secondary | ICD-10-CM | POA: Diagnosis not present

## 2021-04-20 DIAGNOSIS — Z7984 Long term (current) use of oral hypoglycemic drugs: Secondary | ICD-10-CM | POA: Insufficient documentation

## 2021-04-20 DIAGNOSIS — I10 Essential (primary) hypertension: Secondary | ICD-10-CM | POA: Insufficient documentation

## 2021-04-20 DIAGNOSIS — F419 Anxiety disorder, unspecified: Secondary | ICD-10-CM | POA: Insufficient documentation

## 2021-04-20 DIAGNOSIS — R079 Chest pain, unspecified: Secondary | ICD-10-CM | POA: Diagnosis present

## 2021-04-20 DIAGNOSIS — R0789 Other chest pain: Principal | ICD-10-CM | POA: Insufficient documentation

## 2021-04-20 DIAGNOSIS — E1140 Type 2 diabetes mellitus with diabetic neuropathy, unspecified: Secondary | ICD-10-CM | POA: Insufficient documentation

## 2021-04-20 DIAGNOSIS — Z7982 Long term (current) use of aspirin: Secondary | ICD-10-CM | POA: Diagnosis not present

## 2021-04-20 DIAGNOSIS — E785 Hyperlipidemia, unspecified: Secondary | ICD-10-CM | POA: Insufficient documentation

## 2021-04-20 DIAGNOSIS — E781 Pure hyperglyceridemia: Secondary | ICD-10-CM | POA: Diagnosis not present

## 2021-04-20 DIAGNOSIS — E1149 Type 2 diabetes mellitus with other diabetic neurological complication: Secondary | ICD-10-CM | POA: Diagnosis present

## 2021-04-20 DIAGNOSIS — Z20822 Contact with and (suspected) exposure to covid-19: Secondary | ICD-10-CM | POA: Insufficient documentation

## 2021-04-20 DIAGNOSIS — E1159 Type 2 diabetes mellitus with other circulatory complications: Secondary | ICD-10-CM | POA: Diagnosis present

## 2021-04-20 DIAGNOSIS — I2582 Chronic total occlusion of coronary artery: Secondary | ICD-10-CM | POA: Diagnosis not present

## 2021-04-20 DIAGNOSIS — Z87891 Personal history of nicotine dependence: Secondary | ICD-10-CM | POA: Diagnosis not present

## 2021-04-20 DIAGNOSIS — E1142 Type 2 diabetes mellitus with diabetic polyneuropathy: Secondary | ICD-10-CM | POA: Diagnosis present

## 2021-04-20 DIAGNOSIS — I152 Hypertension secondary to endocrine disorders: Secondary | ICD-10-CM | POA: Diagnosis present

## 2021-04-20 DIAGNOSIS — Z7901 Long term (current) use of anticoagulants: Secondary | ICD-10-CM | POA: Insufficient documentation

## 2021-04-20 DIAGNOSIS — Z91040 Latex allergy status: Secondary | ICD-10-CM | POA: Diagnosis not present

## 2021-04-20 DIAGNOSIS — F32A Depression, unspecified: Secondary | ICD-10-CM | POA: Insufficient documentation

## 2021-04-20 DIAGNOSIS — Z7902 Long term (current) use of antithrombotics/antiplatelets: Secondary | ICD-10-CM | POA: Insufficient documentation

## 2021-04-20 DIAGNOSIS — Z79899 Other long term (current) drug therapy: Secondary | ICD-10-CM | POA: Insufficient documentation

## 2021-04-20 DIAGNOSIS — E1169 Type 2 diabetes mellitus with other specified complication: Secondary | ICD-10-CM | POA: Diagnosis present

## 2021-04-20 DIAGNOSIS — F418 Other specified anxiety disorders: Secondary | ICD-10-CM | POA: Diagnosis present

## 2021-04-20 LAB — COMPREHENSIVE METABOLIC PANEL
ALT: 35 U/L (ref 0–44)
AST: 27 U/L (ref 15–41)
Albumin: 3.5 g/dL (ref 3.5–5.0)
Alkaline Phosphatase: 45 U/L (ref 38–126)
Anion gap: 10 (ref 5–15)
BUN: 15 mg/dL (ref 6–20)
CO2: 21 mmol/L — ABNORMAL LOW (ref 22–32)
Calcium: 8.8 mg/dL — ABNORMAL LOW (ref 8.9–10.3)
Chloride: 108 mmol/L (ref 98–111)
Creatinine, Ser: 0.85 mg/dL (ref 0.61–1.24)
GFR, Estimated: >60 mL/min (ref >60)
Glucose, Bld: 216 mg/dL — ABNORMAL HIGH (ref 70–99)
Potassium: 3.6 mmol/L (ref 3.5–5.1)
Sodium: 139 mmol/L (ref 135–145)
Total Bilirubin: 0.5 mg/dL (ref 0.3–1.2)
Total Protein: 6 g/dL — ABNORMAL LOW (ref 6.5–8.1)

## 2021-04-20 LAB — CBC WITH DIFFERENTIAL/PLATELET
Abs Immature Granulocytes: 0.02 10*3/uL (ref 0.00–0.07)
Basophils Absolute: 0.1 10*3/uL (ref 0.0–0.1)
Basophils Relative: 1 %
Eosinophils Absolute: 0.2 10*3/uL (ref 0.0–0.5)
Eosinophils Relative: 4 %
HCT: 38.1 % — ABNORMAL LOW (ref 39.0–52.0)
Hemoglobin: 12.3 g/dL — ABNORMAL LOW (ref 13.0–17.0)
Immature Granulocytes: 0 %
Lymphocytes Relative: 32 %
Lymphs Abs: 1.7 10*3/uL (ref 0.7–4.0)
MCH: 27.7 pg (ref 26.0–34.0)
MCHC: 32.3 g/dL (ref 30.0–36.0)
MCV: 85.8 fL (ref 80.0–100.0)
Monocytes Absolute: 0.4 10*3/uL (ref 0.1–1.0)
Monocytes Relative: 7 %
Neutro Abs: 3 10*3/uL (ref 1.7–7.7)
Neutrophils Relative %: 56 %
Platelets: 215 10*3/uL (ref 150–400)
RBC: 4.44 MIL/uL (ref 4.22–5.81)
RDW: 13.2 % (ref 11.5–15.5)
WBC: 5.4 10*3/uL (ref 4.0–10.5)
nRBC: 0 % (ref 0.0–0.2)

## 2021-04-20 LAB — D-DIMER, QUANTITATIVE: D-Dimer, Quant: <0.27 ug/mL-FEU (ref 0.00–0.50)

## 2021-04-20 LAB — TROPONIN I (HIGH SENSITIVITY)
Troponin I (High Sensitivity): 3 ng/L (ref <18)
Troponin I (High Sensitivity): 3 ng/L (ref <18)

## 2021-04-20 LAB — RESP PANEL BY RT-PCR (FLU A&B, COVID) ARPGX2
Influenza A by PCR: NEGATIVE
Influenza B by PCR: NEGATIVE
SARS Coronavirus 2 by RT PCR: NEGATIVE

## 2021-04-20 MED ORDER — INSULIN ASPART 100 UNIT/ML IJ SOLN
0.0000 [IU] | Freq: Three times a day (TID) | INTRAMUSCULAR | Status: DC
  Administered 2021-04-21 (×2): 2 [IU] via SUBCUTANEOUS
  Administered 2021-04-22: 3 [IU] via SUBCUTANEOUS
  Administered 2021-04-22: 2 [IU] via SUBCUTANEOUS

## 2021-04-20 MED ORDER — ESCITALOPRAM OXALATE 10 MG PO TABS
20.0000 mg | ORAL_TABLET | Freq: Every day | ORAL | Status: DC
  Administered 2021-04-21 – 2021-04-22 (×2): 20 mg via ORAL
  Filled 2021-04-20 (×2): qty 2

## 2021-04-20 MED ORDER — ACETAMINOPHEN 325 MG PO TABS: 650.0000 mg | ORAL_TABLET | ORAL | Status: DC | PRN

## 2021-04-20 MED ORDER — ARIPIPRAZOLE 5 MG PO TABS
5.0000 mg | ORAL_TABLET | Freq: Every day | ORAL | Status: DC
  Administered 2021-04-21 – 2021-04-22 (×2): 5 mg via ORAL
  Filled 2021-04-20 (×2): qty 1

## 2021-04-20 MED ORDER — ROSUVASTATIN CALCIUM 20 MG PO TABS
40.0000 mg | ORAL_TABLET | Freq: Every day | ORAL | Status: DC
  Administered 2021-04-21 – 2021-04-22 (×2): 40 mg via ORAL
  Filled 2021-04-20 (×2): qty 2

## 2021-04-20 MED ORDER — ONDANSETRON HCL 4 MG/2ML IJ SOLN: 4.0000 mg | Freq: Four times a day (QID) | INTRAMUSCULAR | Status: DC | PRN

## 2021-04-20 MED ORDER — GABAPENTIN 400 MG PO CAPS
400.0000 mg | ORAL_CAPSULE | Freq: Three times a day (TID) | ORAL | Status: DC
  Administered 2021-04-20 – 2021-04-22 (×5): 400 mg via ORAL
  Filled 2021-04-20 (×5): qty 1

## 2021-04-20 MED ORDER — NITROGLYCERIN 0.4 MG SL SUBL: 0.4000 mg | SUBLINGUAL_TABLET | SUBLINGUAL | Status: DC | PRN

## 2021-04-20 MED ORDER — ENOXAPARIN SODIUM 40 MG/0.4ML IJ SOSY
40.0000 mg | PREFILLED_SYRINGE | INTRAMUSCULAR | Status: DC
  Administered 2021-04-20 – 2021-04-21 (×2): 40 mg via SUBCUTANEOUS
  Filled 2021-04-20 (×2): qty 0.4

## 2021-04-20 MED ORDER — LOSARTAN POTASSIUM 25 MG PO TABS
25.0000 mg | ORAL_TABLET | Freq: Every day | ORAL | Status: DC
  Administered 2021-04-21 – 2021-04-22 (×2): 25 mg via ORAL
  Filled 2021-04-20 (×2): qty 1

## 2021-04-20 MED ORDER — ASPIRIN EC 81 MG PO TBEC
81.0000 mg | DELAYED_RELEASE_TABLET | Freq: Every day | ORAL | Status: DC
  Administered 2021-04-21 – 2021-04-22 (×2): 81 mg via ORAL
  Filled 2021-04-20 (×2): qty 1

## 2021-04-20 MED ORDER — TOPIRAMATE 25 MG PO TABS
50.0000 mg | ORAL_TABLET | Freq: Two times a day (BID) | ORAL | Status: DC
  Administered 2021-04-20 – 2021-04-22 (×4): 50 mg via ORAL
  Filled 2021-04-20 (×5): qty 2

## 2021-04-20 NOTE — ED Notes (Signed)
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

## 2021-04-20 NOTE — H&P (Signed)
History and Physical    Jared Wood [id number] DOB: 11/02/1965 DOA: 04/20/2021  PCP: Marco Collie, MD  Patient coming from: Home via EMS  I have personally briefly reviewed patient's old medical records in [place]  Chief Complaint: Chest pain  HPI: Jared Wood is a 55 y.o. 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  Patient reports heavy left-sided chest pressure beginning around 1 PM 04/20/2021.  Pressure radiated to his left shoulder and was associated with diaphoresis, nausea without emesis, and some dyspnea.  He initially thought this was acid reflux and ate lunch without relief.  He had some nitroglycerin recently prescribed by his PCP.  He took 2 doses with some relief before calling EMS.  He says he was given 1 additional nitroglycerin with some more improvement.  He says he takes aspirin 81 mg daily and took 3 extra as well near the time of his symptoms.  Patient states that he has had a similar more severe episode about 3 weeks ago.  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  He was working out [employer] when he developed similar symptoms while driving.  The symptoms were constant and lasted about 6-7 hours at the time.  He did not seek any medical attention at that time.  Patient states that he was diagnosed with angina many years ago but does not think he had any further cardiac work-up at that time.  He was admitted in the hospital in 2016 for strokelike symptoms which was ultimately thought to be conversion disorder.  His wife states that since that admission he has had some issues with memory and delayed speech which is unchanged from baseline.  He is a former cigarette smoker of a pack per day for approximately 10 years.  Quit around 2012.  He does occasionally continue to  use smokeless tobacco and vaping.  He denies any significant alcohol use.  His wife also notes that she is concerned he has underlying sleep apnea as she notices that he snores significantly, has apneic episodes while asleep, with daytime fatigue.  ED Course:  Initial vitals showed BP 99/61, pulse 74, RR 16, temp 97.7 F, SPO2 96% on room air.  Labs show WBC 5.4, hemoglobin 12.3, platelets 215,000, sodium 139, potassium 3.6, bicarb 21, BUN 15, creatinine 0.85, serum glucose 216, LFTs within normal limits, D-dimer <0.27.  High-sensitivity troponin 3x2.  SARS-CoV-2 and influenza PCR's are negative.  Portable chest x-ray is negative for focal consolidation, edema, or effusion.  The hospitalist service was consulted to admit for further evaluation of chest pain.  Review of Systems: All systems reviewed and are negative except as documented in history of present illness above.   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   Depression    Diabetes mellitus without complication ([place])    Type 2   Essential hypertension 04/07/2015   GERD (gastroesophageal reflux disease)    Hard of hearing    Headache 06/30/2015   Heart disease  Hemiplegic migraine    History of bleeding ulcers    History of kidney stones    Hyperlipemia    Hypertension    Left-sided weakness    Migraine    Migraine with aura and without status migrainosus, not intractable 06/17/2019   Mixed hyperlipidemia 04/07/2015   Neuropathy    Pain in joint of left shoulder 07/24/2015   PONV (postoperative nausea and vomiting)    Nausea    Primary osteoarthritis of left knee 08/14/2015   S/P total knee replacement 12/01/2015   Skin cancer 2019   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/2015   Overview:  Most likely explanation for her symptoms at admission   Status post total right knee replacement 12/16/2015   Stroke ([place]) 04/2015   pt's wife said they were told by a doctor this may have been conversion disorder. was given TPA and underwent rehabilitation.   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 ([place]) 04/07/2015    Past Surgical History:  Procedure Laterality Date   APPENDECTOMY  2002   CHOLECYSTECTOMY     2003 or 2004   COLONOSCOPY W/ ENDOSCOPIC [place]     CORONARY ANGIOPLASTY  2008   HAND SURGERY Left 2001   hand reconstruction   HERNIA REPAIR     LITHOTRIPSY     SHOULDER ARTHROSCOPY WITH OPEN ROTATOR CUFF REPAIR Right    TONSILLECTOMY  1972   TOTAL KNEE ARTHROPLASTY Right 12/01/2015   Procedure: TOTAL KNEE ARTHROPLASTY;  Surgeon: Vickey Huger, MD;  Location: [facility];  Service: Orthopedics;  Laterality: Right;    Social History:  reports that he has quit smoking. His smokeless tobacco use includes chew. He reports current alcohol use. He reports that he does not use drugs.  Allergies  Allergen Reactions   Meperidine Anaphylaxis and Other (See Comments)    Confusion and behavioral changes (makes patient things negative things)     Latex Rash   Tape Rash and Other (See Comments)    Can stay on for ONLY a limited length of time    Family History  Problem Relation Age of Onset   Myocarditis Mother    Prostate cancer Father    Liver cancer Father    Kidney cancer Father    Other Father        lymph nodes   COPD Father    Emphysema Father    Congestive Heart Failure Father    High blood pressure Father    Arthritis Father      Prior to Admission medications   Medication Sig Start Date End Date Taking? Authorizing Provider  ARIPiprazole (ABILIFY) 5 MG tablet Take 5 mg by mouth daily.   Yes [provider]  aspirin EC 81 MG tablet Take 81 mg by mouth daily with lunch.   Yes [provider]  clopidogrel (PLAVIX) 75 MG tablet Take 75 mg by mouth daily.   Yes [provider]  dapagliflozin propanediol (FARXIGA) 10 MG TABS tablet Take 10 mg by mouth daily.   Yes [provider]  dicyclomine (BENTYL) 10 MG capsule Take 10 mg by mouth in the morning and at bedtime.   Yes [provider]  escitalopram (LEXAPRO) 20 MG tablet Take 20 mg by mouth daily.   Yes [provider]  fenofibrate (TRICOR) 145 MG tablet Take 145 mg by mouth daily.   Yes [provider]  folic acid (FOLVITE) Q000111Q MCG tablet Take 800 mcg by mouth daily with breakfast.  Yes [provider]  gabapentin (NEURONTIN) 400 MG capsule Take 1 capsule (400 mg total) by mouth 3 (three) times daily. 02/19/21  Yes Raulkar, Clide Deutscher, MD  levocetirizine (XYZAL) 5 MG tablet Take 5 mg by mouth at bedtime.   Yes [provider]  losartan (COZAAR) 25 MG tablet Take 25 mg by mouth daily.   Yes [provider]  meloxicam (MOBIC) 7.5 MG tablet Take 1 tablet (7.5 mg total) by mouth daily. 03/23/21  Yes Raulkar, Clide Deutscher, MD  metFORMIN (GLUCOPHAGE) 1000 MG tablet Take 1,000 mg by mouth 2 (two) times daily with a meal.   Yes [provider]  Multiple Vitamins-Minerals (CENTRUM MEN) TABS Take 1 tablet by mouth daily with breakfast.   Yes [provider]  nitroGLYCERIN (NITROSTAT) 0.4 MG SL tablet Place 0.4 mg under the tongue every 5 (five) minutes as needed for chest pain. 04/17/21  Yes [provider]  omeprazole (PRILOSEC) 20 MG capsule Take 20 mg by mouth 2 (two) times daily before a meal.   Yes [provider]  rosuvastatin (CRESTOR) 40 MG tablet Take 40 mg by mouth daily. 03/17/21  Yes [provider]  Semaglutide, 1 MG/DOSE, (OZEMPIC, 1 MG/DOSE,) 4 MG/3ML SOPN Inject 1 mg into the skin once a week. On Sunday   Yes  [provider]  topiramate (TOPAMAX) 50 MG tablet Take 50 mg by mouth 2 (two) times daily.   Yes [provider]  Ubrogepant (UBRELVY) 100 MG TABS Take 100 mg by mouth every 2 (two) hours as needed. Maximum '200mg'$  a day. 09/29/20  Yes Melvenia Beam, MD  Fremanezumab-vfrm (AJOVY) 225 MG/1.5ML SOAJ Inject 1.5 mLs into the skin every 30 (thirty) days. 04/09/21   Melvenia Beam, MD  methocarbamol (ROBAXIN) 500 MG tablet Take 1 tablet (500 mg total) by mouth every 8 (eight) hours as needed for muscle spasms. Patient not taking: No sig reported 12/09/20   Jamse Arn, MD    Physical Exam: Vitals:   04/20/21 1930 04/20/21 1945 04/20/21 2000 04/20/21 2015  BP: 116/64 104/60 108/61 104/60  Pulse: 73 65 65 63  Resp: '16 17 14 15  '$ Temp:      TempSrc:      SpO2: 94% 96% 94% 94%  Weight:      Height:       Constitutional: Obese man resting bed with head elevated, appears sleepy but in NAD, calm, comfortable Eyes: PERRL, lids and conjunctivae normal ENMT: Mucous membranes are moist. Posterior pharynx clear of any exudate or lesions.Normal dentition.  Neck: normal, supple, no masses. Respiratory: clear to auscultation bilaterally, no wheezing, no crackles. Normal respiratory effort. No accessory muscle use.  Cardiovascular: Regular rate and rhythm, no murmurs / rubs / gallops. No extremity edema. 2+ pedal pulses. Abdomen: no tenderness, no masses palpated. No hepatosplenomegaly. Bowel sounds positive.  Musculoskeletal: no clubbing / cyanosis. No joint deformity upper and lower extremities. Good ROM, no contractures. Normal muscle tone.  Skin: Multiple linear scratch marks of both legs (from pet dogs per patient). No induration Neurologic: Somewhat delayed speech.  CN 2-12 grossly intact. Sensation intact. Strength 5/5 in all 4.  Psychiatric: Normal judgment and insight. Alert and oriented x 3. Normal mood.     Labs on Admission: I have personally reviewed following labs and  imaging studies  CBC: Recent Labs  Lab 04/20/21 1728  WBC 5.4  NEUTROABS 3.0  HGB 12.3*  HCT 38.1*  MCV 85.8  PLT 215  Basic Metabolic Panel: Recent Labs  Lab 04/20/21 1728  NA 139  K 3.6  CL 108  CO2 21*  GLUCOSE 216*  BUN 15  CREATININE 0.85  CALCIUM 8.8*   GFR: Estimated Creatinine Clearance: 125.9 mL/min (by C-G formula based on SCr of 0.85 mg/dL). Liver Function Tests: Recent Labs  Lab 04/20/21 1728  AST 27  ALT 35  ALKPHOS 45  BILITOT 0.5  PROT 6.0*  ALBUMIN 3.5   No results for input(s): LIPASE, AMYLASE in the last 168 hours. No results for input(s): AMMONIA in the last 168 hours. Coagulation Profile: No results for input(s): INR, PROTIME in the last 168 hours. Cardiac Enzymes: No results for input(s): CKTOTAL, CKMB, CKMBINDEX, TROPONINI in the last 168 hours. BNP (last 3 results) No results for input(s): PROBNP in the last 8760 hours. HbA1C: No results for input(s): HGBA1C in the last 72 hours. CBG: No results for input(s): GLUCAP in the last 168 hours. Lipid Profile: No results for input(s): CHOL, HDL, LDLCALC, TRIG, CHOLHDL, LDLDIRECT in the last 72 hours. Thyroid Function Tests: No results for input(s): TSH, T4TOTAL, FREET4, T3FREE, THYROIDAB in the last 72 hours. Anemia Panel: No results for input(s): VITAMINB12, FOLATE, FERRITIN, TIBC, IRON, RETICCTPCT in the last 72 hours. Urine analysis:    Component Value Date/Time   COLORURINE YELLOW 11/21/2015 1321   APPEARANCEUR CLEAR 11/21/2015 1321   LABSPEC 1.039 (H) 11/21/2015 1321   PHURINE 5.5 11/21/2015 1321   GLUCOSEU >1000 (A) 11/21/2015 1321   HGBUR NEGATIVE 11/21/2015 1321   BILIRUBINUR NEGATIVE 11/21/2015 1321   KETONESUR NEGATIVE 11/21/2015 1321   PROTEINUR NEGATIVE 11/21/2015 1321   NITRITE NEGATIVE 11/21/2015 1321   LEUKOCYTESUR NEGATIVE 11/21/2015 1321    Radiological Exams on Admission: DG Chest Portable 1 View  Result Date: 04/20/2021 CLINICAL DATA:  Chest pain for 2  hours EXAM: PORTABLE CHEST 1 VIEW COMPARISON:  01/11/2017 FINDINGS: Cardiac shadow is prominent but accentuated by the frontal technique. The lungs are clear. No bony abnormality is noted. IMPRESSION: No acute abnormality noted. Electronically Signed   By: Inez Catalina M.D.   On: 04/20/2021 18:22    EKG: Personally reviewed. Normal sinus rhythm, small Q-wave lead III, no acute ischemic changes.  When compared to prior Q waves new, mild ST depression leads V4-V6 no longer present.  Assessment/Plan Principal Problem:   Chest pain Active Problems:   Hypertension associated with diabetes ([place])   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 ([place])   Hyperlipidemia associated with type 2 diabetes mellitus ([place])   Depression with anxiety   Jared Wood is a 55 y.o. male with medical history significant for type 2 diabetes, hypertension, hyperlipidemia, migraines, depression/anxiety, history of CVA versus conversion disorder, chronic low back pain who is admitted for evaluation of chest pain.  Chest pain: Patient with mixed anginal type symptoms.  Troponins are negative x2 and EKG does not show any significant acute ischemic changes.  CXR without acute abnormality.  He does have risk factors with T2DM, HTN, HLD, and smoking history. -Monitor on telemetry -Obtain echocardiogram -Continue aspirin 81 mg daily, hold Plavix for now (unclear indication) -Consider inpatient versus outpatient ischemic work-up  Type 2 diabetes: Hold metformin and Farxiga.  Place on sensitive SSI.  Hypertension: Continue losartan.  Hyperlipidemia: Continue rosuvastatin.  Depression/anxiety: Continue Abilify, Lexapro.  History of CVA versus conversion disorder: There is reported history of CVA versus conversion disorder.  Prior imaging studies in our system do not show evidence of stroke.  He has been on aspirin and Plavix without clear indication.  Continuing aspirin, holding Plavix.  Concern for  sleep apnea: Has symptoms concerning for sleep apnea.  Patient recommended to follow-up with PCP for sleep study.  DVT prophylaxis: Lovenox Code Status: Full code, confirmed with patient Family Communication: Discussed with patient spouse at bedside Disposition Plan: From home and likely discharged home pending clinical progress Consults called: None Level of care: Telemetry Cardiac Admission status:  Status is: Observation  The patient remains OBS appropriate and will d/c before 2 midnights.  Dispo: The patient is from: Home              Anticipated d/c is to: Home              Patient currently is not medically stable to d/c.   Difficult to place patient No  Zada Finders MD [facility]  If 7PM-7AM, please contact night-coverage [email or web address]  04/20/2021, 10:08 PM

## 2021-04-20 NOTE — ED Provider Notes (Signed)
[facility] EMERGENCY DEPARTMENT Provider Note   CSN: [id number] Arrival date & time: 04/20/21  1640     History Chief Complaint  Patient presents with   Chest Pain    Jared Wood is a 55 y.o. male.   Chest Pain Associated symptoms: no abdominal pain, no back pain, no diaphoresis, no shortness of breath and no weakness   Patient presents with chest pain.  Anterior chest.  Began around 2 hours prior to arrival.  States it was 10 out of 10 now after nitroglycerin is around a 7 out of 10.  States that on Friday he was told by his PCP he may have had a heart attack.  States there were changes on EKG.  After further prompting from wife patient states that 3 weeks ago he had an episode of chest pain.  Was severe.  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  States that he did not see anyone because he did not think it was serious has been able to do the normal physical activity.  No change in that.  States he was given nitroglycerin at the doctor on Friday and he took that today.  Is on Plavix due to previous stroke.  States when he gets tired he has difficulty speaking.  No known cardiac history.  Patient is an over the road trucker.    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   Depression    Diabetes mellitus without complication ([place])    Type 2   Essential hypertension 04/07/2015   GERD (gastroesophageal reflux disease)    Hard of hearing    Headache 06/30/2015   Heart disease    Hemiplegic migraine    History of bleeding ulcers    History of kidney stones    Hyperlipemia     Hypertension    Left-sided weakness    Migraine    Migraine with aura and without status migrainosus, not intractable 06/17/2019   Mixed hyperlipidemia 04/07/2015   Neuropathy    Pain in joint of left shoulder 07/24/2015   PONV (postoperative nausea and vomiting)    Nausea   Primary osteoarthritis of left knee 08/14/2015   S/P total knee replacement 12/01/2015   Skin cancer 2019   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/2015   Overview:  Most likely explanation for her symptoms at admission   Status post total right knee replacement 12/16/2015   Stroke ([place]) 04/2015   pt's wife said they were told by a doctor this may have been conversion disorder. was given TPA and underwent rehabilitation.   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 ([place]) 04/07/2015    Patient Active Problem List   Diagnosis Date Noted   Chronic right-sided low back pain without sciatica 12/09/2020   Sleep disturbance 12/09/2020   Myalgia 12/09/2020   Abnormality of gait 12/09/2020   Migraine with aura and without status migrainosus, not intractable 06/17/2019   Chronic migraine without aura, with intractable migraine, so stated, with status migrainosus 06/17/2019   Alteration of awareness 06/17/2019   Status post total right knee replacement 12/16/2015  S/P total knee replacement 12/01/2015   Primary osteoarthritis of left knee 08/14/2015   Chronic pain of both knees 07/24/2015   Pain in joint of left shoulder 07/24/2015   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   Headache 06/30/2015   Conversion disorder with abnormal movement, acute episode, without psychological stressor 04/12/2015   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/2015   Essential hypertension 04/07/2015   Mixed hyperlipidemia 04/07/2015   Stroke (cerebrum) ([place]) 04/07/2015   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 ([place]) 04/07/2015    Past Surgical History:  Procedure Laterality Date   APPENDECTOMY  2002    CHOLECYSTECTOMY     2003 or 2004   COLONOSCOPY W/ ENDOSCOPIC [place]     CORONARY ANGIOPLASTY  2008   HAND SURGERY Left 2001   hand reconstruction   HERNIA REPAIR     LITHOTRIPSY     SHOULDER ARTHROSCOPY WITH OPEN ROTATOR CUFF REPAIR Right    TONSILLECTOMY  1972   TOTAL KNEE ARTHROPLASTY Right 12/01/2015   Procedure: TOTAL KNEE ARTHROPLASTY;  Surgeon: Vickey Huger, MD;  Location: [place];  Service: Orthopedics;  Laterality: Right;       Family History  Problem Relation Age of Onset   Myocarditis Mother    Prostate cancer Father    Liver cancer Father    Kidney cancer Father    Other Father        lymph nodes   COPD Father    Emphysema Father    Congestive Heart Failure Father    High blood pressure Father    Arthritis Father     Social History   Tobacco Use   Smoking status: Former   Smokeless tobacco: Current    Types: Chew   Tobacco comments:    uses 1-2 cans/day  Vaping Use   Vaping Use: Former  Substance Use Topics   Alcohol use: Yes    Comment: occasional   Drug use: No    Comment: quit hard drugs in [place] Medications Prior to Admission medications   Medication Sig Start Date End Date Taking? Authorizing Provider  ARIPiprazole (ABILIFY) 5 MG tablet Take 5 mg by mouth daily.   Yes [provider]  aspirin EC 81 MG tablet Take 81 mg by mouth daily with lunch.   Yes [provider]  clopidogrel (PLAVIX) 75 MG tablet Take 75 mg by mouth daily.   Yes [provider]  dapagliflozin propanediol (FARXIGA) 10 MG TABS tablet Take 10 mg by mouth daily.   Yes [provider]  dicyclomine (BENTYL) 10 MG capsule Take 10 mg by mouth in the morning and at bedtime.   Yes [provider]  escitalopram (LEXAPRO) 20 MG tablet Take 20 mg by mouth daily.   Yes [provider]  fenofibrate (TRICOR) 145 MG tablet Take 145 mg by mouth daily.   Yes [provider]  folic acid (FOLVITE) Q000111Q MCG tablet Take 800 mcg by  mouth daily with breakfast.    Yes [provider]  gabapentin (NEURONTIN) 400 MG capsule Take 1 capsule (400 mg total) by mouth 3 (three) times daily. 02/19/21  Yes Raulkar, Clide Deutscher, MD  levocetirizine (XYZAL) 5 MG tablet Take 5 mg by mouth at bedtime.   Yes [provider]  losartan (COZAAR) 25 MG tablet Take 25 mg by mouth daily.   Yes [provider]  meloxicam (MOBIC) 7.5 MG tablet Take 1 tablet (7.5 mg total) by mouth  daily. 03/23/21  Yes Raulkar, Clide Deutscher, MD  metFORMIN (GLUCOPHAGE) 1000 MG tablet Take 1,000 mg by mouth 2 (two) times daily with a meal.   Yes [provider]  Multiple Vitamins-Minerals (CENTRUM MEN) TABS Take 1 tablet by mouth daily with breakfast.   Yes [provider]  nitroGLYCERIN (NITROSTAT) 0.4 MG SL tablet Place 0.4 mg under the tongue every 5 (five) minutes as needed for chest pain. 04/17/21  Yes [provider]  omeprazole (PRILOSEC) 20 MG capsule Take 20 mg by mouth 2 (two) times daily before a meal.   Yes [provider]  rosuvastatin (CRESTOR) 40 MG tablet Take 40 mg by mouth daily. 03/17/21  Yes [provider]  Semaglutide, 1 MG/DOSE, (OZEMPIC, 1 MG/DOSE,) 4 MG/3ML SOPN Inject 1 mg into the skin once a week. On Sunday   Yes [provider]  topiramate (TOPAMAX) 50 MG tablet Take 50 mg by mouth 2 (two) times daily.   Yes [provider]  Ubrogepant (UBRELVY) 100 MG TABS Take 100 mg by mouth every 2 (two) hours as needed. Maximum '200mg'$  a day. 09/29/20  Yes Melvenia Beam, MD  Fremanezumab-vfrm (AJOVY) 225 MG/1.5ML SOAJ Inject 1.5 mLs into the skin every 30 (thirty) days. 04/09/21   Melvenia Beam, MD  methocarbamol (ROBAXIN) 500 MG tablet Take 1 tablet (500 mg total) by mouth every 8 (eight) hours as needed for muscle spasms. Patient not taking: No sig reported 12/09/20   Jamse Arn, MD    Allergies    Meperidine, Latex, and Tape  Review of Systems   Review of  Systems  Constitutional:  Negative for diaphoresis.  HENT:  Negative for congestion.   Respiratory:  Negative for shortness of breath.   Cardiovascular:  Positive for chest pain.  Gastrointestinal:  Negative for abdominal pain.  Genitourinary:  Negative for flank pain.  Musculoskeletal:  Negative for back pain.  Skin:  Negative for rash.  Neurological:  Positive for speech difficulty. Negative for weakness.  Psychiatric/Behavioral:  Negative for confusion.    Physical Exam Updated Vital Signs BP 106/60   Pulse 66   Temp 97.7 F (36.5 C) (Oral)   Resp 17   Ht '5\' 9"'$  (1.753 m)   Wt 117.9 kg   SpO2 95%   BMI 38.40 kg/m   Physical Exam Vitals and nursing note reviewed.  HENT:     Head: Atraumatic.  Cardiovascular:     Rate and Rhythm: Normal rate and regular rhythm.     Heart sounds: No murmur heard. Pulmonary:     Breath sounds: No decreased breath sounds.  Chest:     Chest wall: No tenderness.  Abdominal:     Tenderness: There is no abdominal tenderness.  Musculoskeletal:     Cervical back: Neck supple.     Right lower leg: Edema present.     Left lower leg: Edema present.     Comments: Trace edema bilateral lower extremities.  Abrasions also bilateral lower legs.  Skin:    General: Skin is warm.     Capillary Refill: Capillary refill takes less than 2 seconds.  Neurological:     Mental Status: He is alert and oriented to person, place, and time.    ED Results / Procedures / Treatments   Labs (all labs ordered are listed, but only abnormal results are displayed) Labs Reviewed  COMPREHENSIVE METABOLIC PANEL - Abnormal; Notable for the following components:      Result Value   CO2  21 (*)    Glucose, Bld 216 (*)    Calcium 8.8 (*)    Total Protein 6.0 (*)    All other components within normal limits  CBC WITH DIFFERENTIAL/PLATELET - Abnormal; Notable for the following components:   Hemoglobin 12.3 (*)    HCT 38.1 (*)    All other components within normal  limits  RESP PANEL BY RT-PCR (FLU A&B, COVID) ARPGX2  D-DIMER, QUANTITATIVE  TROPONIN I (HIGH SENSITIVITY)  TROPONIN I (HIGH SENSITIVITY)    EKG EKG Interpretation  Date/Time:  Monday April 20 2021 16:55:23 EDT Ventricular Rate:  73 PR Interval:  158 QRS Duration: 92 QT Interval:  382 QTC Calculation: 421 R Axis:   51 Text Interpretation: Sinus rhythm Confirmed by Davonna Belling [phone]) on 04/20/2021 5:10:24 PM  Radiology DG Chest Portable 1 View  Result Date: 04/20/2021 CLINICAL DATA:  Chest pain for 2 hours EXAM: PORTABLE CHEST 1 VIEW COMPARISON:  01/11/2017 FINDINGS: Cardiac shadow is prominent but accentuated by the frontal technique. The lungs are clear. No bony abnormality is noted. IMPRESSION: No acute abnormality noted. Electronically Signed   By: Inez Catalina M.D.   On: 04/20/2021 18:22    Procedures Procedures   Medications Ordered in ED Medications - No data to display  ED Course  I have reviewed the triage vital signs and the nursing notes.  Pertinent labs & imaging results that were available during my care of the patient were reviewed by me and considered in my medical decision making (see chart for details).    MDM Rules/Calculators/A&P                           Patient presents with chest pain.  Anterior chest.  Began today although did have episode 3 weeks ago.  Anterior chest.  Improved with nitroglycerin.  Feels much better now.  Patient had been told by PCP that he had a heart attack somewhere over the last few months.  There are some nonspecific EKG changes but nothing necessarily diagnostic for an MI previously.  No acute STEMI.  Troponin negative.  Will admit to hospitalist however for further cardiac work-up. 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  Doubt pulmonary embolism.  Doubt aortic dissection.  Doubt pneumonia.  Doubt pneumothorax Final Clinical Impression(s) / ED Diagnoses Final diagnoses:  Chest pain, unspecified type     Rx / DC Orders ED Discharge Orders     None        Davonna Belling, MD 04/20/21 2036

## 2021-04-20 NOTE — ED Triage Notes (Addendum)
Pt here by [facility] with c/o chest pain which began 1 hour ago.  Saw PCP Friday 9/16 who told him he had a MI which did not show up 4 months ago.   4mg  zofran  324 ASA 0.4 nitro X3 with some relief. 1300 mL NS 94% RA 120/80

## 2021-04-21 ENCOUNTER — Observation Stay (HOSPITAL_COMMUNITY): Payer: Managed Care, Other (non HMO)

## 2021-04-21 ENCOUNTER — Other Ambulatory Visit (HOSPITAL_BASED_OUTPATIENT_CLINIC_OR_DEPARTMENT_OTHER): Payer: Self-pay | Admitting: Cardiology

## 2021-04-21 ENCOUNTER — Observation Stay (HOSPITAL_COMMUNITY)
Admit: 2021-04-21 | Discharge: 2021-04-21 | Disposition: A | Discharge status: Home / Self Care | Payer: Managed Care, Other (non HMO) | Attending: Cardiology | Admitting: Cardiology

## 2021-04-21 ENCOUNTER — Observation Stay (HOSPITAL_BASED_OUTPATIENT_CLINIC_OR_DEPARTMENT_OTHER): Payer: Managed Care, Other (non HMO)

## 2021-04-21 DIAGNOSIS — R072 Precordial pain: Secondary | ICD-10-CM

## 2021-04-21 DIAGNOSIS — E1169 Type 2 diabetes mellitus with other specified complication: Secondary | ICD-10-CM

## 2021-04-21 DIAGNOSIS — I2510 Atherosclerotic heart disease of native coronary artery without angina pectoris: Secondary | ICD-10-CM | POA: Diagnosis not present

## 2021-04-21 DIAGNOSIS — R931 Abnormal findings on diagnostic imaging of heart and coronary circulation: Secondary | ICD-10-CM

## 2021-04-21 DIAGNOSIS — E785 Hyperlipidemia, unspecified: Secondary | ICD-10-CM

## 2021-04-21 DIAGNOSIS — R079 Chest pain, unspecified: Secondary | ICD-10-CM

## 2021-04-21 DIAGNOSIS — R071 Chest pain on breathing: Secondary | ICD-10-CM

## 2021-04-21 LAB — CBC
HCT: 41.1 % (ref 39.0–52.0)
Hemoglobin: 13.3 g/dL (ref 13.0–17.0)
MCH: 27.9 pg (ref 26.0–34.0)
MCHC: 32.4 g/dL (ref 30.0–36.0)
MCV: 86.2 fL (ref 80.0–100.0)
Platelets: 243 10*3/uL (ref 150–400)
RBC: 4.77 MIL/uL (ref 4.22–5.81)
RDW: 13.4 % (ref 11.5–15.5)
WBC: 5.9 10*3/uL (ref 4.0–10.5)
nRBC: 0 % (ref 0.0–0.2)

## 2021-04-21 LAB — LIPID PANEL
Cholesterol: 118 mg/dL (ref 0–200)
HDL: 27 mg/dL — ABNORMAL LOW (ref >40)
LDL Cholesterol: UNDETERMINED mg/dL (ref 0–99)
Total CHOL/HDL Ratio: 4.4 RATIO
Triglycerides: 413 mg/dL — ABNORMAL HIGH (ref <150)
VLDL: UNDETERMINED mg/dL (ref 0–40)

## 2021-04-21 LAB — LDL CHOLESTEROL, DIRECT: Direct LDL: 51.6 mg/dL (ref 0–99)

## 2021-04-21 LAB — HIV ANTIBODY (ROUTINE TESTING W REFLEX): HIV Screen 4th Generation wRfx: NONREACTIVE

## 2021-04-21 LAB — ECHOCARDIOGRAM COMPLETE
Area-P 1/2: 4.21 cm2
Height: 69 in
S' Lateral: 3.5 cm
Weight: 4160 oz

## 2021-04-21 LAB — BASIC METABOLIC PANEL
Anion gap: 8 (ref 5–15)
BUN: 14 mg/dL (ref 6–20)
CO2: 23 mmol/L (ref 22–32)
Calcium: 9.4 mg/dL (ref 8.9–10.3)
Chloride: 108 mmol/L (ref 98–111)
Creatinine, Ser: 0.86 mg/dL (ref 0.61–1.24)
GFR, Estimated: >60 mL/min (ref >60)
Glucose, Bld: 226 mg/dL — ABNORMAL HIGH (ref 70–99)
Potassium: 4 mmol/L (ref 3.5–5.1)
Sodium: 139 mmol/L (ref 135–145)

## 2021-04-21 LAB — GLUCOSE, CAPILLARY: Glucose-Capillary: 189 mg/dL — ABNORMAL HIGH (ref 70–99)

## 2021-04-21 LAB — CBG MONITORING, ED
Glucose-Capillary: 162 mg/dL — ABNORMAL HIGH (ref 70–99)
Glucose-Capillary: 172 mg/dL — ABNORMAL HIGH (ref 70–99)

## 2021-04-21 LAB — HEMOGLOBIN A1C
Hgb A1c MFr Bld: 7.6 % — ABNORMAL HIGH (ref 4.8–5.6)
Mean Plasma Glucose: 171.42 mg/dL

## 2021-04-21 IMAGING — CT CT HEART MORP W/ CTA COR W/ SCORE W/ CA W/CM &/OR W/O CM
4 of 7 series · 8 of 20 positions shown, 9 images · IV contrast (omnipaque)
Comparison: None.
COMPARISON: None.

Addendum:
EXAM:
OVER-READ INTERPRETATION  CT CHEST

The following report is an over-read performed by radiologist Dr.
Deeqa Rayaan Adlaho [REDACTED] on 04/21/2021. This
over-read does not include interpretation of cardiac or coronary
anatomy or pathology. The coronary calcium score/coronary CTA
interpretation by the cardiologist is attached.
HISTORY: Chest pain, nonspecific
Cardiac/Coronary CT
TECHNIQUE: The patient was scanned on a Siemens Force scanner.
PROTOCOL: A 130 kV prospective scan was triggered in the descending thoracic
aorta at 111 HU's. Axial non-contrast 3 mm slices were carried out
through the heart. The data set was analyzed on a dedicated work
station and scored using the Agatson method. Gantry rotation speed
was 250 msecs and collimation was 0.6 mm. Heart rate optimized
medically, and 0.8 mg of sublingual nitroglycerin was given. The 3D
data set was reconstructed in 5% intervals of 35-75% of the R-R
cycle. Diastolic phases were analyzed on a dedicated work station
using MPR, MIP and VRT modes. The patient received 95mL OMNIPAQUE
IOHEXOL 350 MG/ML SOLN of contrast.

[Series 7: best diast 74 % · axial · 0.40mm/px · z∈[+30,+71]mm · 2 of 311 slices shown, 3 images]
[im 104/311  vessel]
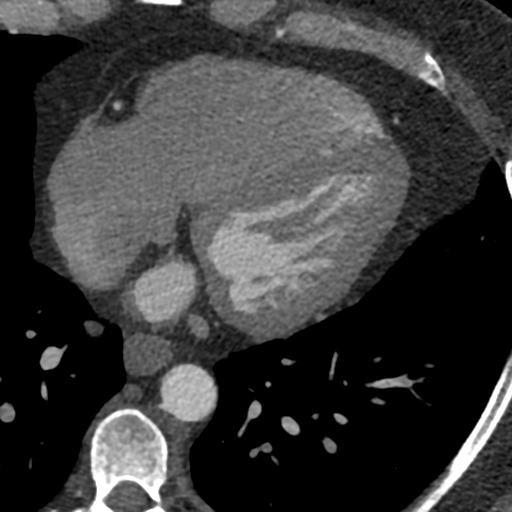
[im 104/311  lung]
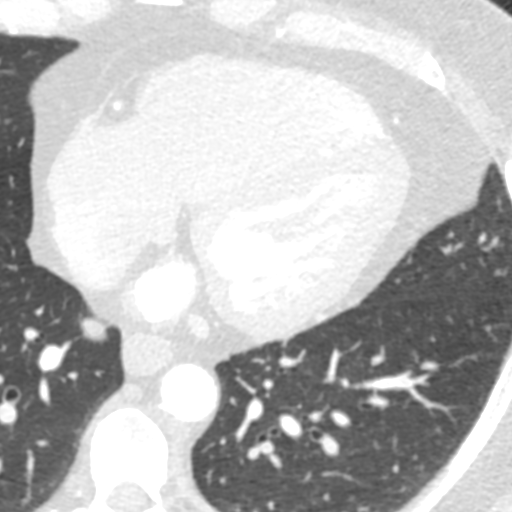
[im 207/311  vessel]
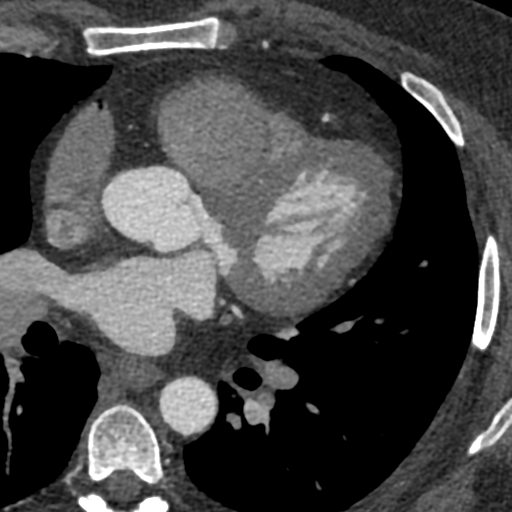

[Series 8: best syst 36 % · axial · 0.40mm/px · z∈[+30,+71]mm · 2 of 311 slices shown]
[im 104/311  vessel]
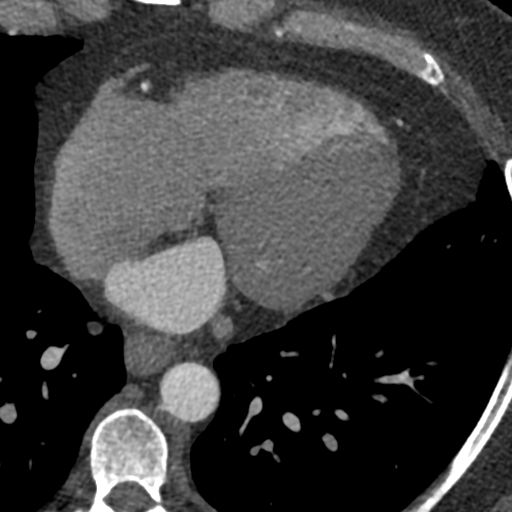
[im 207/311  vessel]
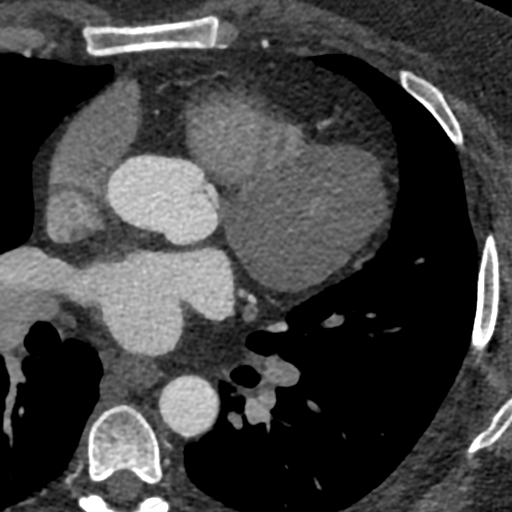

[Series 9: ts diast sharp 74 % · axial · 0.40mm/px · z∈[+30,+71]mm · 2 of 311 slices shown]
[im 104/311  lung]
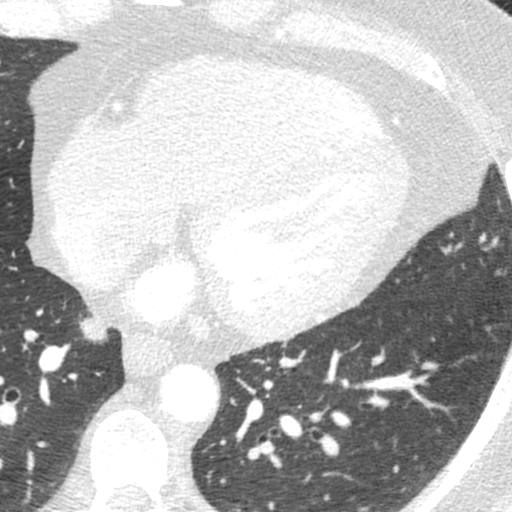
[im 207/311  lung]
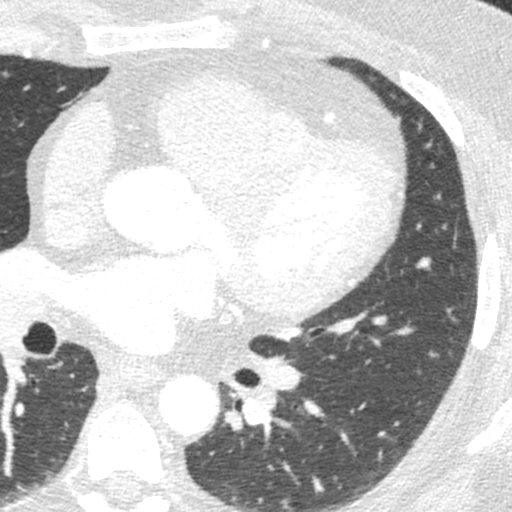

[Series 10: ts syst sharp 36 % · axial · 0.40mm/px · z∈[+30,+71]mm · 2 of 311 slices shown]
[im 104/311  lung]
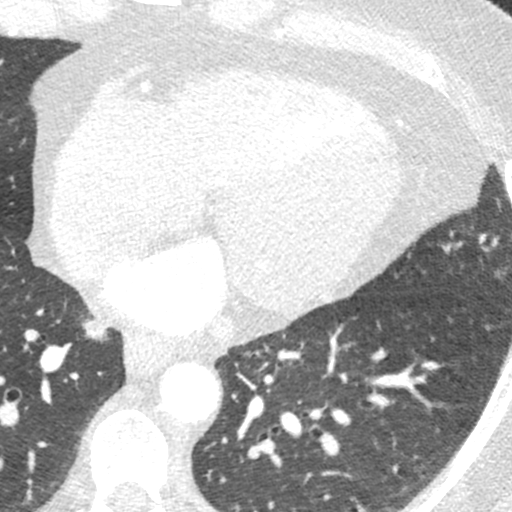
[im 207/311  lung]
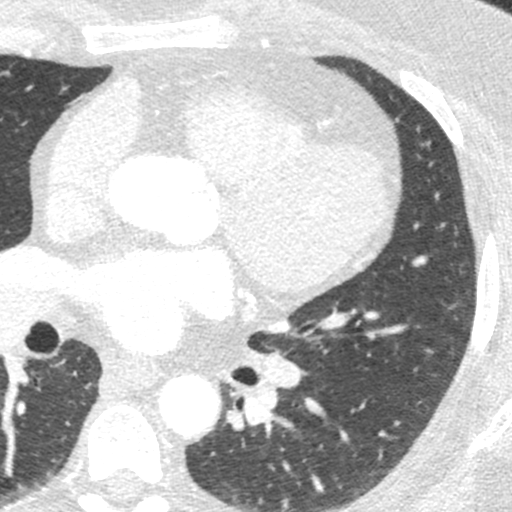

[8 of 20 positions shown; findings below may reference images not displayed]

FINDINGS: Within the visualized portions of the thorax there are no suspicious
appearing pulmonary nodules or masses, there is no acute
consolidative airspace disease, no pleural effusions, no
pneumothorax and no lymphadenopathy. Visualized portions of the
upper abdomen are unremarkable. There are no aggressive appearing
lytic or blastic lesions noted in the visualized portions of the
skeleton.
IMPRESSION: 1. No significant incidental noncardiac findings are noted.
FINDINGS: Coronary calcium score: The patient's coronary artery calcium score
is 772, which places the patient in the 98th percentile.

Coronary arteries: Normal coronary origins.  Right dominance.

Right Coronary Artery: Normal caliber vessel, gives rise to PDA.
Predominantly noncalcified plaque in proximal RCA with 1-24%
stenosis. Mixed calcified and noncalcified plaque in distal RCA with
1-24% stenosis.

Left Main Coronary Artery: Normal caliber vessel. There is mixed
calcified and noncalcified plaque in the mid to distal left main
with 25-49% stenosis.

Left Anterior Descending Coronary Artery: Normal caliber vessel.
There is diffuse mixed calcified and noncalcified plaque in the
proximal LAD, with highest grade stenosis 50-69%. There is an area
in the mid LAD with noncalcified plaque with at least 50-69%
stenosis, and higher degree stenosis cannot be excluded. There is
mixed calcified and noncalcified plaque in the distal LAD with at
least 25-49% stenosis. Gives rise to medium D1 and D2 branches.

Left Circumflex Artery: Normal caliber vessel. Mixed calcified and
noncalcified plaque in the proximal Lcx with 25-49% stenosis. Gives
rise to 1 OM branch.

Aorta: Normal size, 31 mm at the mid ascending aorta (level of the
PA bifurcation) measured double oblique. No dissection seen in
visualized portions of the aorta.

Aortic Valve: No calcifications. Trileaflet.

Other findings:

Normal pulmonary vein drainage into the left atrium.

Normal left atrial appendage without a thrombus.

Normal size of the pulmonary artery.

Normal appearance of the pericardium.
IMPRESSION: 1. Moderate CAD, CADRADS = 3. Diffuse mixed plaque, with largest
burden in the LAD. CT FFR will be performed and reported separately.

2. Coronary calcium score of 772. This was 98th percentile for age
and sex matched control.

3. Normal coronary origin with right dominance.

INTERPRETATION:

1. CAD-RADS 0: No evidence of CAD (0%). Consider non-atherosclerotic
causes of chest pain.

2. CAD-RADS 1: Minimal non-obstructive CAD (0-24%). Consider
non-atherosclerotic causes of chest pain. Consider preventive
therapy and risk factor modification.

3. CAD-RADS 2: Mild non-obstructive CAD (25-49%). Consider
non-atherosclerotic causes of chest pain. Consider preventive
therapy and risk factor modification.

4. CAD-RADS 3: Moderate stenosis (50-69%). Consider symptom-guided
anti-ischemic pharmacotherapy as well as risk factor modification
per guideline directed care. Additional analysis with CT FFR will be
submitted.

5. CAD-RADS 4: Severe stenosis. (70-99% or > 50% left main). Cardiac
catheterization or CT FFR is recommended. Consider symptom-guided
anti-ischemic pharmacotherapy as well as risk factor modification
per guideline directed care. Invasive coronary angiography
recommended with revascularization per published guideline
statements.

6. CAD-RADS 5: Total coronary occlusion (100%). Consider cardiac
catheterization or viability assessment. Consider symptom-guided
anti-ischemic pharmacotherapy as well as risk factor modification
per guideline directed care.

7. CAD-RADS N: Non-diagnostic study. Obstructive CAD can't be
excluded. Alternative evaluation is recommended.

*** End of Addendum ***
EXAM:
OVER-READ INTERPRETATION  CT CHEST

The following report is an over-read performed by radiologist Dr.
Deeqa Rayaan Adlaho [REDACTED] on 04/21/2021. This
over-read does not include interpretation of cardiac or coronary
anatomy or pathology. The coronary calcium score/coronary CTA
interpretation by the cardiologist is attached.
FINDINGS: Within the visualized portions of the thorax there are no suspicious
appearing pulmonary nodules or masses, there is no acute
consolidative airspace disease, no pleural effusions, no
pneumothorax and no lymphadenopathy. Visualized portions of the
upper abdomen are unremarkable. There are no aggressive appearing
lytic or blastic lesions noted in the visualized portions of the
skeleton.
IMPRESSION: 1. No significant incidental noncardiac findings are noted.

## 2021-04-21 MED ORDER — IOHEXOL 350 MG/ML SOLN
95.0000 mL | Freq: Once | INTRAVENOUS | Status: AC | PRN
  Administered 2021-04-21: 95 mL via INTRAVENOUS

## 2021-04-21 MED ORDER — NITROGLYCERIN 0.4 MG SL SUBL
SUBLINGUAL_TABLET | SUBLINGUAL | Status: AC
  Filled 2021-04-21: qty 2

## 2021-04-21 MED ORDER — DAPAGLIFLOZIN PROPANEDIOL 10 MG PO TABS
10.0000 mg | ORAL_TABLET | Freq: Every day | ORAL | Status: DC
  Administered 2021-04-21 – 2021-04-22 (×2): 10 mg via ORAL
  Filled 2021-04-21 (×2): qty 1

## 2021-04-21 MED ORDER — CLOPIDOGREL BISULFATE 75 MG PO TABS
75.0000 mg | ORAL_TABLET | Freq: Every day | ORAL | Status: DC
  Administered 2021-04-21 – 2021-04-22 (×2): 75 mg via ORAL
  Filled 2021-04-21 (×2): qty 1

## 2021-04-21 MED ORDER — PERFLUTREN LIPID MICROSPHERE
1.0000 mL | INTRAVENOUS | Status: AC | PRN
  Administered 2021-04-21: 2 mL via INTRAVENOUS
  Filled 2021-04-21: qty 10

## 2021-04-21 MED ORDER — PANTOPRAZOLE SODIUM 40 MG PO TBEC
40.0000 mg | DELAYED_RELEASE_TABLET | Freq: Every day | ORAL | Status: DC
  Administered 2021-04-21 – 2021-04-22 (×2): 40 mg via ORAL
  Filled 2021-04-21 (×2): qty 1

## 2021-04-21 MED ORDER — LACTATED RINGERS IV SOLN: INTRAVENOUS | Status: DC

## 2021-04-21 MED ORDER — FENOFIBRATE 54 MG PO TABS
54.0000 mg | ORAL_TABLET | Freq: Every day | ORAL | Status: DC
  Administered 2021-04-21 – 2021-04-22 (×2): 54 mg via ORAL
  Filled 2021-04-21 (×2): qty 1

## 2021-04-21 NOTE — Consult Note (Addendum)
Cardiology Consultation:   Patient ID: Jared Wood MRN: [id number]; DOB: 1966/01/20  Admit date: 04/20/2021 Date of Consult: 04/21/2021  PCP:  Marco Collie, MD   [facility] Providers Cardiologist:  None   {   Patient Profile:   Jared Wood is a 55 y.o. male with a hx of type 2 diabetes, hypertension, hyperlipidemia, migraine, anxiety with depression, stroke like symptoms in 2016, conversion disorder, chronic low back pain, medical noncompliance, gait abnormality, morbid obesity, who is being seen 04/21/2021 for the evaluation of chest pain at the request of Dr. Posey Pronto.  History of Present Illness:   Mr. [name] does not follow cardiology outpatient, has no previous existing cardiac condition.  Per outpatient records review, he suffers chronic migraine and conversion disorder and follows neurology outpatient.  He had history of abnormal focal neurological symptoms in the past, MRI from 04/2019 without acute CVA.  Etiology was felt due to migraine plus conversion disorder by neurology.  He is on aspirin and Plavix at baseline. He has type 2 diabetes, A1c 7.6%, takes Farxiga 10 mg, metformin 1000 mg twice daily, and semaglutide. He takes losartan 25 mg daily for hypertension.  Patient presented to the ER today complaining chest pain.  He states pain started around 1 PM on 04/20/2021, located on the left side of the chest, described it as pressure.  Pain radiating to the left shoulder, also reports associated diaphoresis, nausea without emesis, dyspnea chest.  He thought this was GERD, although had no improvement of symptoms after eating lunch.  He took some sublingual nitroglycerin that was prescribed by his PCP, felt symptoms had improved.   During encounter, he states his chest pain lasted until midnight yesterday.  It occurred when he was resting.  He significant other states that he had a similar chest pain episode 2 weeks ago while driving and sometime in Sep-Oct. He denied ever having history of  cardiac work-up in the past, such as stress test. He states his father had aortic aneurysm and his mother died of myocarditis, grandfather had MI at age of 14s. He denied weight gain, has occasional leg welling. He quit smoking 10 yrs ago, used to drink more ETOH but now occasionally, denied illicit drugs use. His significant other reports he had stroke symptoms in 2016, but eventual workup negative for CVA, but he did required rehab after and was told he has conversion disorder.   Admission diagnostic revealed hyperglycemia with glucose 216, otherwise CMP grossly unremarkable.  High sensitive troponin 3>3.  CBC with hemoglobin 12.3, otherwise unremarkable.  D-dimer negative.  A1c 7.6%.  Triglycerides elevated at 413, LDL not resulted.  Flu and COVID-19 negative.  Chest x-ray revealed no acute finding.  EKG revealed sinus rhythm with ventricular rate of 73, unchanged minimal ST depression of lead III, no acute ischemic change noted.  He took additional aspirin at home and given additional nitroglycerin by EMS.  He is admitted to hospital medicine service, made NPO, cardiology consult is requested.     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   Depression    Diabetes mellitus without complication ([place])    Type 2  Essential hypertension 04/07/2015   GERD (gastroesophageal reflux disease)    Hard of hearing    Headache 06/30/2015   Heart disease    Hemiplegic migraine    History of bleeding ulcers    History of kidney stones    Hyperlipemia    Hypertension    Left-sided weakness    Migraine    Migraine  with aura and without status migrainosus, not intractable 06/17/2019   Mixed hyperlipidemia 04/07/2015   Neuropathy    Pain in joint of left shoulder 07/24/2015   PONV (postoperative nausea and vomiting)    Nausea   Primary osteoarthritis of left knee 08/14/2015   S/P total knee replacement 12/01/2015   Skin cancer 2019   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/2015   Overview:  Most likely explanation for her symptoms at admission   Status post total right knee replacement 12/16/2015   Stroke ([place]) 04/2015   pt's wife said they were told by a doctor this may have been conversion disorder. was given TPA and underwent rehabilitation.   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 ([place]) 04/07/2015    Past Surgical History:  Procedure Laterality Date   APPENDECTOMY  2002   CHOLECYSTECTOMY     2003 or 2004   COLONOSCOPY W/ ENDOSCOPIC [place]     CORONARY ANGIOPLASTY  2008   HAND SURGERY Left 2001   hand reconstruction   HERNIA REPAIR     LITHOTRIPSY     SHOULDER ARTHROSCOPY WITH OPEN ROTATOR CUFF REPAIR Right    TONSILLECTOMY  1972   TOTAL KNEE ARTHROPLASTY Right 12/01/2015   Procedure: TOTAL KNEE ARTHROPLASTY;  Surgeon: Vickey Huger, MD;  Location: [facility];  Service: Orthopedics;  Laterality: Right;     Home Medications:  Prior to Admission medications   Medication Sig Start Date End Date Taking? Authorizing Provider  ARIPiprazole (ABILIFY) 5 MG tablet Take 5 mg by mouth daily.   Yes [provider]  aspirin EC 81 MG tablet Take 81 mg by mouth daily with lunch.   Yes [provider]  clopidogrel (PLAVIX) 75 MG tablet Take 75 mg by mouth daily.   Yes [provider]  dapagliflozin propanediol (FARXIGA) 10 MG TABS tablet Take 10 mg by mouth daily.   Yes [provider]  dicyclomine (BENTYL) 10 MG capsule Take 10 mg by mouth in the morning and at bedtime.   Yes [provider]  escitalopram (LEXAPRO) 20 MG tablet Take 20 mg by mouth daily.   Yes  [provider]  fenofibrate (TRICOR) 145 MG tablet Take 145 mg by mouth daily.   Yes [provider]  folic acid (FOLVITE) 321 MCG tablet Take 800 mcg by mouth daily with breakfast.    Yes [provider]  gabapentin (NEURONTIN) 400 MG capsule Take 1 capsule (400 mg total) by mouth 3 (three) times daily. 02/19/21  Yes Raulkar, Clide Deutscher, MD  levocetirizine (XYZAL) 5 MG tablet Take 5 mg by mouth at bedtime.   Yes [provider]  losartan (COZAAR) 25 MG tablet Take 25 mg by mouth daily.   Yes [provider]  meloxicam (MOBIC) 7.5 MG tablet Take 1 tablet (7.5 mg total) by mouth daily. 03/23/21  Yes Raulkar, Clide Deutscher, MD  metFORMIN (GLUCOPHAGE) 1000 MG tablet Take 1,000 mg by mouth 2 (two) times daily with a meal.   Yes [provider]  Multiple Vitamins-Minerals (CENTRUM MEN) TABS Take 1 tablet by mouth daily with breakfast.  Yes [provider]  nitroGLYCERIN (NITROSTAT) 0.4 MG SL tablet Place 0.4 mg under the tongue every 5 (five) minutes as needed for chest pain. 04/17/21  Yes [provider]  omeprazole (PRILOSEC) 20 MG capsule Take 20 mg by mouth 2 (two) times daily before a meal.   Yes [provider]  rosuvastatin (CRESTOR) 40 MG tablet Take 40 mg by mouth daily. 03/17/21  Yes [provider]  Semaglutide, 1 MG/DOSE, (OZEMPIC, 1 MG/DOSE,) 4 MG/3ML SOPN Inject 1 mg into the skin once a week. On Sunday   Yes [provider]  topiramate (TOPAMAX) 50 MG tablet Take 50 mg by mouth 2 (two) times daily.   Yes [provider]  Ubrogepant (UBRELVY) 100 MG TABS Take 100 mg by mouth every 2 (two) hours as needed. Maximum 200mg  a day. 09/29/20  Yes Melvenia Beam, MD  Fremanezumab-vfrm (AJOVY) 225 MG/1.5ML SOAJ Inject 1.5 mLs into the skin every 30 (thirty) days. 04/09/21   Melvenia Beam, MD  methocarbamol (ROBAXIN) 500 MG tablet Take 1 tablet (500 mg total) by mouth every 8 (eight) hours as needed  for muscle spasms. Patient not taking: No sig reported 12/09/20   Jamse Arn, MD    Inpatient Medications: Scheduled Meds:  ARIPiprazole  5 mg Oral Daily   aspirin EC  81 mg Oral Q lunch   clopidogrel  75 mg Oral Daily   dapagliflozin propanediol  10 mg Oral Daily   enoxaparin (LOVENOX) injection  40 mg Subcutaneous Q24H   escitalopram  20 mg Oral Daily   fenofibrate  54 mg Oral Daily   gabapentin  400 mg Oral TID   insulin aspart  0-9 Units Subcutaneous TID WC   losartan  25 mg Oral Daily   pantoprazole  40 mg Oral Daily   rosuvastatin  40 mg Oral Daily   topiramate  50 mg Oral BID   Continuous Infusions:  PRN Meds: acetaminophen, nitroGLYCERIN, ondansetron (ZOFRAN) IV, perflutren lipid microspheres (DEFINITY) IV suspension  Allergies:    Allergies  Allergen Reactions   Meperidine Anaphylaxis and Other (See Comments)    Confusion and behavioral changes (makes patient things negative things)     Latex Rash   Tape Rash and Other (See Comments)    Can stay on for ONLY a limited length of time    Social History:   Social History   Socioeconomic History   Marital status: Married    Spouse name: Not on file   Number of children: 3   Years of education: 2 yrs college   Highest education level: Not on file  Occupational History   Not on file  Tobacco Use   Smoking status: Former   Smokeless tobacco: Current    Types: Chew   Tobacco comments:    uses 1-2 cans/day  Vaping Use   Vaping Use: Former  Substance and Sexual Activity   Alcohol use: Yes    Comment: occasional   Drug use: No    Comment: quit hard drugs in 1990   Sexual activity: Not on file  Other Topics Concern   Not on file  Social History Narrative   Lives at home with spouse   Caffeine: "not as much as I used to", can drink up to 2-3 cups/day   Social Determinants of Health   Financial Resource Strain: Not on file  Food Insecurity: Not on file  Transportation Needs: Not on file   Physical Activity: Not on file  Stress:  Not on file  Social Connections: Not on file  Intimate Partner Violence: Not on file    Family History:    Family History  Problem Relation Age of Onset   Myocarditis Mother    Prostate cancer Father    Liver cancer Father    Kidney cancer Father    Other Father        lymph nodes   COPD Father    Emphysema Father    Congestive Heart Failure Father    High blood pressure Father    Arthritis Father      ROS:  Constitutional: Denied fever, chills, malaise, night sweats Eyes: Denied vision change or loss Ears/Nose/Mouth/Throat: Denied ear ache, sore throat, coughing, sinus pain Cardiovascular:see HPI  Respiratory: see HPI  Gastrointestinal: Denied nausea, vomiting, abdominal pain, diarrhea Genital/Urinary: Denied dysuria, hematuria, urinary frequency/urgency Musculoskeletal: Denied muscle ache, joint pain, weakness Skin: Denied rash, wound Neuro: Denied headache, dizziness, syncope Psych: history of depression/anxiety  Endocrine: history of diabetes   Physical Exam/Data:   Vitals:   04/21/21 0500 04/21/21 0700 04/21/21 0800 04/21/21 1000  BP: (!) 107/57 103/66 112/64 118/71  Pulse: (!) 58 (!) 58 (!) 59 (!) 55  Resp: 12 12 13 12   Temp:      TempSrc:      SpO2: 93% 95% 96% 99%  Weight:      Height:       No intake or output data in the 24 hours ending 04/21/21 1149 Last 3 Weights 04/20/2021 02/19/2021 12/09/2020  Weight (lbs) 260 lb 253 lb 267 lb 3.2 oz  Weight (kg) 117.935 kg 114.76 kg 121.201 kg     Body mass index is 38.4 kg/m.   Vitals:  Vitals:   04/21/21 0800 04/21/21 1000  BP: 112/64 118/71  Pulse: (!) 59 (!) 55  Resp: 13 12  Temp:    SpO2: 96% 99%   General Appearance: In no apparent distress, laying in bed HEENT: Normocephalic, atraumatic. EOMs intact.  Neck: Supple, trachea midline, no JVDs Cardiovascular: Regular rate and rhythm, normal S1-S2,  no murmur/rub/gallop, S3/S4 Respiratory: Resting breathing  unlabored, lungs sounds clear to auscultation bilaterally, no use of accessory muscles. On room air.  No wheezes, rales or rhonchi.   Gastrointestinal: Bowel sounds positive, abdomen soft, non-tender, non-distended.  Extremities: Able to move all extremities in bed without difficulty, no edema/cyanosis/clubbing Genitourinary: genital exam not performed Musculoskeletal: Normal muscle bulk and tone Skin: Intact, warm, dry. No rashes or petechiae noted in exposed areas.  Neurologic: Alert, oriented to person, place and time. Fluent speech,no cognitive deficit, no gross focal neuro deficit Psychiatric: Normal affect. Mood is appropriate.    EKG:  The EKG was personally reviewed and demonstrates:  SR with no acute ischemic change  Telemetry:  Telemetry was personally reviewed and demonstrates:  SR with rate of 60s, occasional PACs   Relevant CV Studies:  Echocardiogram from 04/21/2021:   1. Left ventricular ejection fraction, by estimation, is 60 to 65%. The  left ventricle has normal function. The left ventricle has no regional  wall motion abnormalities. Left ventricular diastolic parameters were  normal.   2. Right ventricular systolic function is normal. The right ventricular  size is normal.   3. Left atrial size was mildly dilated.   4. The mitral valve is normal in structure. No evidence of mitral valve  regurgitation. No evidence of mitral stenosis.   5. The aortic valve is normal in structure. Aortic valve regurgitation is  not visualized. No aortic  stenosis is present.   6. The inferior vena cava is normal in size with greater than 50%  respiratory variability, suggesting right atrial pressure of 3 mmHg.   Laboratory Data:  High Sensitivity Troponin:   Recent Labs  Lab 04/20/21 1728 04/20/21 1955  TROPONINIHS 3 3     Chemistry Recent Labs  Lab 04/20/21 1728 04/21/21 0455  NA 139 139  K 3.6 4.0  CL 108 108  CO2 21* 23  GLUCOSE 216* 226*  BUN 15 14  CREATININE  0.85 0.86  CALCIUM 8.8* 9.4  GFRNONAA >60 >60  ANIONGAP 10 8    Recent Labs  Lab 04/20/21 1728  PROT 6.0*  ALBUMIN 3.5  AST 27  ALT 35  ALKPHOS 45  BILITOT 0.5   Lipids  Recent Labs  Lab 04/21/21 0455  CHOL 118  TRIG 413*  HDL 27*  LDLCALC UNABLE TO CALCULATE IF TRIGLYCERIDE OVER 400 mg/dL  CHOLHDL 4.4    Hematology Recent Labs  Lab 04/20/21 1728 04/21/21 0455  WBC 5.4 5.9  RBC 4.44 4.77  HGB 12.3* 13.3  HCT 38.1* 41.1  MCV 85.8 86.2  MCH 27.7 27.9  MCHC 32.3 32.4  RDW 13.2 13.4  PLT 215 243   Thyroid No results for input(s): TSH, FREET4 in the last 168 hours.  BNPNo results for input(s): BNP, PROBNP in the last 168 hours.  DDimer  Recent Labs  Lab 04/20/21 1728  DDIMER <0.27     Radiology/Studies:  DG Chest Portable 1 View  Result Date: 04/20/2021 CLINICAL DATA:  Chest pain for 2 hours EXAM: PORTABLE CHEST 1 VIEW COMPARISON:  01/11/2017 FINDINGS: Cardiac shadow is prominent but accentuated by the frontal technique. The lungs are clear. No bony abnormality is noted. IMPRESSION: No acute abnormality noted. Electronically Signed   By: Inez Catalina M.D.   On: 04/20/2021 18:22   ECHOCARDIOGRAM COMPLETE  Result Date: 04/21/2021    ECHOCARDIOGRAM REPORT   Patient Name:   Jared Wood  Date of Exam: 04/21/2021 Medical Rec #:  [id number]  Height:       69.0 in Accession #:    [id number] Weight:       260.0 lb Date of Birth:  [birth year] BSA:          2.310 m Patient Age:    77 years   BP:           103/66 mmHg Patient Gender: M          HR:           58 bpm. Exam Location:  Inpatient Procedure: 2D Echo, Color Doppler, Cardiac Doppler and Intracardiac            Opacification Agent Indications:    R07.9* Chest pain, unspecified  History:        Patient has no prior history of Echocardiogram examinations.                 Stroke; Risk Factors:Dyslipidemia, Hypertension and Diabetes.  Sonographer:    Bernadene Person RDCS Referring Phys: [id number] [place]  1.  Left ventricular ejection fraction, by estimation, is 60 to 65%. The left ventricle has normal function. The left ventricle has no regional wall motion abnormalities. Left ventricular diastolic parameters were normal.  2. Right ventricular systolic function is normal. The right ventricular size is normal.  3. Left atrial size was mildly dilated.  4. The mitral valve is normal in structure. No evidence of mitral valve regurgitation. No evidence  of mitral stenosis.  5. The aortic valve is normal in structure. Aortic valve regurgitation is not visualized. No aortic stenosis is present.  6. The inferior vena cava is normal in size with greater than 50% respiratory variability, suggesting right atrial pressure of 3 mmHg. FINDINGS  Left Ventricle: Left ventricular ejection fraction, by estimation, is 60 to 65%. The left ventricle has normal function. The left ventricle has no regional wall motion abnormalities. Definity contrast agent was given IV to delineate the left ventricular  endocardial borders. The left ventricular internal cavity size was normal in size. There is no left ventricular hypertrophy. Left ventricular diastolic parameters were normal. Normal left ventricular filling pressure. Right Ventricle: The right ventricular size is normal. No increase in right ventricular wall thickness. Right ventricular systolic function is normal. Left Atrium: Left atrial size was mildly dilated. Right Atrium: Right atrial size was normal in size. Pericardium: There is no evidence of pericardial effusion. Mitral Valve: The mitral valve is normal in structure. No evidence of mitral valve regurgitation. No evidence of mitral valve stenosis. Tricuspid Valve: The tricuspid valve is normal in structure. Tricuspid valve regurgitation is not demonstrated. No evidence of tricuspid stenosis. Aortic Valve: The aortic valve is normal in structure. Aortic valve regurgitation is not visualized. No aortic stenosis is present. Pulmonic  Valve: The pulmonic valve was normal in structure. Pulmonic valve regurgitation is not visualized. No evidence of pulmonic stenosis. Aorta: The aortic root is normal in size and structure. Venous: The inferior vena cava is normal in size with greater than 50% respiratory variability, suggesting right atrial pressure of 3 mmHg. IAS/Shunts: No atrial level shunt detected by color flow Doppler.  LEFT VENTRICLE PLAX 2D LVIDd:         5.00 cm  Diastology LVIDs:         3.50 cm  LV e' medial:    9.38 cm/s LV PW:         1.00 cm  LV E/e' medial:  9.1 LV IVS:        1.10 cm  LV e' lateral:   12.40 cm/s LVOT diam:     2.00 cm  LV E/e' lateral: 6.9 LV SV:         74 LV SV Index:   32 LVOT Area:     3.14 cm  LEFT ATRIUM             Index       RIGHT ATRIUM           Index LA diam:        3.80 cm 1.65 cm/m  RA Area:     16.90 cm LA Vol (A2C):   61.3 ml 26.54 ml/m RA Volume:   44.20 ml  19.14 ml/m LA Vol (A4C):   66.7 ml 28.88 ml/m LA Biplane Vol: 65.9 ml 28.53 ml/m  AORTIC VALVE LVOT Vmax:   100.00 cm/s LVOT Vmean:  66.500 cm/s LVOT VTI:    0.237 m  AORTA Ao Root diam: 3.50 cm Ao Asc diam:  3.40 cm MITRAL VALVE MV Area (PHT): 4.21 cm    SHUNTS MV Decel Time: 180 msec    Systemic VTI:  0.24 m MV E velocity: 85.40 cm/s  Systemic Diam: 2.00 cm MV A velocity: 52.10 cm/s MV E/A ratio:  1.64 Fransico Him MD Electronically signed by Fransico Him MD Signature Date/Time: 04/21/2021/10:40:10 AM    Final      Assessment and Plan:   Chest pain -Presented with left-sided  chest pain radiating to shoulder starting at 1 PM on 04/20/2021, relieved by nitroglycerin -High sensitive troponin negative x2 -EKG without acute ischemic change -Echo from today reviewed EF 60 to 65%, no regional wall motion abnormality, diastolic parameter normal, mildly dilated LA, no significant valvular disease. -Risk factor including diabetes, HTN, morbid obesity, HLD, family hx  -recommend ischemic evaluation with CTA coronary study, patient  agreeable, will maintain NPO for now, coordinator will arrange the study today   Hypertension - BP well controlled on losartan   Hyperlipidemia Hypertriglyceridemia -on Crestor 40 mg daily and fenofibrate 54 mg daily, may refer to lipid clinic if not improving   Type 2 diabetes Conversion disorder with hx of abnormal neurological symptoms  Anxiety with depression Morbid obesity Chronic migraine - managed per IM    Risk Assessment/Risk Scores:   HEAR Score (for undifferentiated chest pain):  HEAR Score: 6          For questions or updates, please contact [place] Please consult [email or web address] for contact info under    Signed, Margie Billet, NP  04/21/2021 11:49 AM   I have examined the patient and reviewed assessment and plan and discussed with patient.  Agree with above as stated.  Patient with negative troponin and reassuring ECG.  He does have risk factors for heart disease.  Plan for coronary CTA to evaluate for significant CAD.  We spoke about lifestyle modifications and weight loss as well.  He has lost over 100 pounds through dietary modifications in the past.  He is continuing to try to do this.  We spoke about a whole food, plant-based diet.  Avoid processed foods.  Larae Grooms

## 2021-04-21 NOTE — ED Notes (Signed)
Pt sitting up in stretcher, received dinner tray. VSS, no distress noted, denies needs.

## 2021-04-21 NOTE — Progress Notes (Signed)
PROGRESS NOTE                                                                                                                                                                                                             Patient Demographics:    Jared Wood, is a 55 y.o. male, DOB - 04-23-1966, OIZ:[id number]  Outpatient Primary MD for the patient is Marco Collie, MD    LOS - 0  Admit date - 04/20/2021    Chief Complaint  Patient presents with   Chest Pain       Brief Narrative (HPI from H&P) Jared Wood is a 55 y.o. 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   Subjective:    Jared Wood today has, No headache, No chest pain, No abdominal pain - No Nausea, No new weakness tingling or numbness, no SOB.   Assessment  & Plan :    Chest pain: Patient with mixed anginal type symptoms.  Troponins are negative x2 and EKG does not show any significant acute ischemic changes.  CXR without acute abnormality.  TTE stable, with preserved EF motion abnormality, he does have risk factors with T2DM, HTN, HLD, and smoking history, currently on Aspirin - Plavix and statin ( home Meds) , cardiology to evaluate.     Type 2 diabetes: Hold metformin and Farxiga.  Place on sensitive SSI.   Hypertension: Continue losartan.   Hyperlipidemia: Continue rosuvastatin.   Depression/anxiety: Continue Abilify, Lexapro.   History of CVA versus conversion disorder: There is reported history of CVA versus conversion disorder.  Prior imaging studies in our system do not show evidence of stroke.  He has been on aspirin and Plavix without clear indication.  Continuing Meds as in #1 above.   Concern for sleep apnea: Has symptoms concerning for sleep apnea.   Patient recommended to follow-up with PCP for sleep study.      Condition - Fair  Family Communication  :  Wife bedside  Code Status :  Full  Consults  :  Cards  PUD Prophylaxis :    Procedures  :     TTE - stable EF 60%, no WMA      Disposition  Plan  :    Status is: Observation  Dispo: The patient is from: Home              Anticipated d/c is to: Home              Patient currently is not medically stable to d/c.   Difficult to place patient No   DVT Prophylaxis  :   enoxaparin (LOVENOX) injection 40 mg Start: 04/20/21 2315   Diet :  Diet Order             Diet NPO time specified  Diet effective midnight                    Inpatient Medications  Scheduled Meds:  ARIPiprazole  5 mg Oral Daily   aspirin EC  81 mg Oral Q lunch   clopidogrel  75 mg Oral Daily   dapagliflozin propanediol  10 mg Oral Daily   enoxaparin (LOVENOX) injection  40 mg Subcutaneous Q24H   escitalopram  20 mg Oral Daily   fenofibrate  54 mg Oral Daily   gabapentin  400 mg Oral TID   insulin aspart  0-9 Units Subcutaneous TID WC   losartan  25 mg Oral Daily   pantoprazole  40 mg Oral Daily   rosuvastatin  40 mg Oral Daily   topiramate  50 mg Oral BID   Continuous Infusions: PRN Meds:.acetaminophen, nitroGLYCERIN, ondansetron (ZOFRAN) IV, perflutren lipid microspheres (DEFINITY) IV suspension  Antibiotics  :    Anti-infectives (From admission, onward)    None        Time Spent in minutes  30   Lala Lund M.D on 04/21/2021 at 10:56 AM  To page go to [email or web address]   [facility] -  Office  [phone]    See all Orders from today for further details    Objective:   Vitals:   04/21/21 0500 04/21/21 0700 04/21/21 0800 04/21/21 1000  BP: (!) 107/57 103/66 112/64 118/71  Pulse: (!) 58 (!) 58 (!) 59 (!) 55  Resp: 12 12 13 12   Temp:      TempSrc:      SpO2: 93% 95% 96% 99%  Weight:      Height:        Wt Readings from Last 3 Encounters:   04/20/21 117.9 kg  02/19/21 114.8 kg  12/09/20 121.2 kg    No intake or output data in the 24 hours ending 04/21/21 1056   Physical Exam  Awake Alert, No new F.N deficits, Normal affect [place].AT,PERRAL Supple Neck,No JVD, No cervical lymphadenopathy appriciated.  Symmetrical Chest wall movement, Good air movement bilaterally, CTAB RRR,No Gallops,Rubs or new Murmurs, No Parasternal Heave +ve B.Sounds, Abd Soft, No tenderness, No organomegaly appriciated, No rebound - guarding or rigidity. No Cyanosis, Clubbing or edema, No new Rash or bruise     Data Review:    CBC Recent Labs  Lab 04/20/21 1728 04/21/21 0455  WBC 5.4 5.9  HGB 12.3* 13.3  HCT 38.1* 41.1  PLT 215 243  MCV 85.8 86.2  MCH 27.7 27.9  MCHC 32.3 32.4  RDW 13.2 13.4  LYMPHSABS 1.7  --   MONOABS 0.4  --   EOSABS 0.2  --   BASOSABS 0.1  --     Recent Labs  Lab 04/20/21 1728 04/21/21 0455  NA 139 139  K 3.6 4.0  CL 108 108  CO2 21* 23  GLUCOSE 216* 226*  BUN 15 14  CREATININE 0.85 0.86  CALCIUM 8.8* 9.4  AST 27  --   ALT 35  --   ALKPHOS 45  --   BILITOT 0.5  --   ALBUMIN 3.5  --   DDIMER <0.27  --   HGBA1C  --  7.6*    ------------------------------------------------------------------------------------------------------------------ Recent Labs    04/21/21 0455  CHOL 118  HDL 27*  LDLCALC UNABLE TO CALCULATE IF TRIGLYCERIDE OVER 400 mg/dL  TRIG 413*  CHOLHDL 4.4  LDLDIRECT 51.6    Lab Results  Component Value Date   HGBA1C 7.6 (H) 04/21/2021   ------------------------------------------------------------------------------------------------------------------ No results for input(s): TSH, T4TOTAL, T3FREE, THYROIDAB in the last 72 hours.  Invalid input(s): FREET3  Cardiac Enzymes No results for input(s): CKMB, TROPONINI, MYOGLOBIN in the last 168 hours.  Invalid input(s):  CK ------------------------------------------------------------------------------------------------------------------ No results found for: BNP    Radiology Reports DG Chest Portable 1 View  Result Date: 04/20/2021 CLINICAL DATA:  Chest pain for 2 hours EXAM: PORTABLE CHEST 1 VIEW COMPARISON:  01/11/2017 FINDINGS: Cardiac shadow is prominent but accentuated by the frontal technique. The lungs are clear. No bony abnormality is noted. IMPRESSION: No acute abnormality noted. Electronically Signed   By: Inez Catalina M.D.   On: 04/20/2021 18:22   ECHOCARDIOGRAM COMPLETE  Result Date: 04/21/2021    ECHOCARDIOGRAM REPORT   Patient Name:   Jared Wood  Date of Exam: 04/21/2021 Medical Rec #:  [id number]  Height:       69.0 in Accession #:    [id number] Weight:       260.0 lb Date of Birth:  1965-09-11 BSA:          2.310 m Patient Age:    73 years   BP:           103/66 mmHg Patient Gender: M          HR:           58 bpm. Exam Location:  Inpatient Procedure: 2D Echo, Color Doppler, Cardiac Doppler and Intracardiac            Opacification Agent Indications:    R07.9* Chest pain, unspecified  History:        Patient has no prior history of Echocardiogram examinations.                 Stroke; Risk Factors:Dyslipidemia, Hypertension and Diabetes.  Sonographer:    Bernadene Person RDCS Referring Phys: [id number] [place]  1. Left ventricular ejection fraction, by estimation, is 60 to 65%. The left ventricle has normal function. The left ventricle has no regional wall motion abnormalities. Left ventricular diastolic parameters were normal.  2. Right ventricular systolic function is normal. The right ventricular size is normal.  3. Left atrial size was mildly dilated.  4. The mitral valve is normal in structure. No evidence of mitral valve regurgitation. No evidence of mitral stenosis.  5. The aortic valve is normal in structure. Aortic valve regurgitation is not visualized. No aortic stenosis is present.   6. The inferior vena cava is normal in size with greater than 50% respiratory variability, suggesting right atrial pressure of 3 mmHg. FINDINGS  Left Ventricle: Left ventricular ejection fraction, by estimation, is 60 to 65%. The left ventricle has normal function. The left ventricle has no regional wall motion abnormalities. Definity contrast agent was given IV to delineate the left ventricular  endocardial borders. The left ventricular internal cavity size was normal in size. There is no left ventricular  hypertrophy. Left ventricular diastolic parameters were normal. Normal left ventricular filling pressure. Right Ventricle: The right ventricular size is normal. No increase in right ventricular wall thickness. Right ventricular systolic function is normal. Left Atrium: Left atrial size was mildly dilated. Right Atrium: Right atrial size was normal in size. Pericardium: There is no evidence of pericardial effusion. Mitral Valve: The mitral valve is normal in structure. No evidence of mitral valve regurgitation. No evidence of mitral valve stenosis. Tricuspid Valve: The tricuspid valve is normal in structure. Tricuspid valve regurgitation is not demonstrated. No evidence of tricuspid stenosis. Aortic Valve: The aortic valve is normal in structure. Aortic valve regurgitation is not visualized. No aortic stenosis is present. Pulmonic Valve: The pulmonic valve was normal in structure. Pulmonic valve regurgitation is not visualized. No evidence of pulmonic stenosis. Aorta: The aortic root is normal in size and structure. Venous: The inferior vena cava is normal in size with greater than 50% respiratory variability, suggesting right atrial pressure of 3 mmHg. IAS/Shunts: No atrial level shunt detected by color flow Doppler.  LEFT VENTRICLE PLAX 2D LVIDd:         5.00 cm  Diastology LVIDs:         3.50 cm  LV e' medial:    9.38 cm/s LV PW:         1.00 cm  LV E/e' medial:  9.1 LV IVS:        1.10 cm  LV e' lateral:    12.40 cm/s LVOT diam:     2.00 cm  LV E/e' lateral: 6.9 LV SV:         74 LV SV Index:   32 LVOT Area:     3.14 cm  LEFT ATRIUM             Index       RIGHT ATRIUM           Index LA diam:        3.80 cm 1.65 cm/m  RA Area:     16.90 cm LA Vol (A2C):   61.3 ml 26.54 ml/m RA Volume:   44.20 ml  19.14 ml/m LA Vol (A4C):   66.7 ml 28.88 ml/m LA Biplane Vol: 65.9 ml 28.53 ml/m  AORTIC VALVE LVOT Vmax:   100.00 cm/s LVOT Vmean:  66.500 cm/s LVOT VTI:    0.237 m  AORTA Ao Root diam: 3.50 cm Ao Asc diam:  3.40 cm MITRAL VALVE MV Area (PHT): 4.21 cm    SHUNTS MV Decel Time: 180 msec    Systemic VTI:  0.24 m MV E velocity: 85.40 cm/s  Systemic Diam: 2.00 cm MV A velocity: 52.10 cm/s MV E/A ratio:  1.64 Fransico Him MD Electronically signed by Fransico Him MD Signature Date/Time: 04/21/2021/10:40:10 AM    Final

## 2021-04-21 NOTE — Plan of Care (Signed)
  Problem: Education: Goal: Knowledge of General Education information will improve Description: Including pain rating scale, medication(s)/side effects and non-pharmacologic comfort measures Outcome: Completed/Met

## 2021-04-21 NOTE — Progress Notes (Signed)
  Echocardiogram 2D Echocardiogram has been performed.  Jared Wood 04/21/2021, 9:55 AM

## 2021-04-22 ENCOUNTER — Other Ambulatory Visit (HOSPITAL_COMMUNITY): Payer: Self-pay

## 2021-04-22 ENCOUNTER — Encounter (HOSPITAL_COMMUNITY): Payer: Self-pay | Admitting: Internal Medicine

## 2021-04-22 DIAGNOSIS — I2510 Atherosclerotic heart disease of native coronary artery without angina pectoris: Secondary | ICD-10-CM | POA: Diagnosis not present

## 2021-04-22 DIAGNOSIS — E1159 Type 2 diabetes mellitus with other circulatory complications: Secondary | ICD-10-CM | POA: Diagnosis not present

## 2021-04-22 DIAGNOSIS — R079 Chest pain, unspecified: Secondary | ICD-10-CM | POA: Diagnosis not present

## 2021-04-22 DIAGNOSIS — I2584 Coronary atherosclerosis due to calcified coronary lesion: Secondary | ICD-10-CM

## 2021-04-22 DIAGNOSIS — E1169 Type 2 diabetes mellitus with other specified complication: Secondary | ICD-10-CM | POA: Diagnosis not present

## 2021-04-22 DIAGNOSIS — I152 Hypertension secondary to endocrine disorders: Secondary | ICD-10-CM

## 2021-04-22 DIAGNOSIS — E785 Hyperlipidemia, unspecified: Secondary | ICD-10-CM | POA: Diagnosis not present

## 2021-04-22 LAB — GLUCOSE, CAPILLARY
Glucose-Capillary: 206 mg/dL — ABNORMAL HIGH (ref 70–99)
Glucose-Capillary: 165 mg/dL — ABNORMAL HIGH (ref 70–99)

## 2021-04-22 NOTE — Progress Notes (Signed)
Discharge note  Patient alert and orientedx4. Wife at the bedside also oriented and aware of dc instructions. Both verbalized understanding.All belongings given to patient. CCmd aware of dc order. Piv sites unremarkable. Pressure dressing intact. No bleeding. Secretary to call volunteer desk to wheel patient out. Patient ambulatory. No devices needed.

## 2021-04-22 NOTE — Discharge Summary (Signed)
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 Twelve-lead EKG showed no ischemic changes, 2D echo showed an EF of 60% no wall motion abnormalities. Cardiology was consulted recommended a CT coronary that showed no stenosis.  They recommended to continue current management.  Essential hypertension: Well-controlled continue current medications no changes made.  Hyperlipidemia/hypertriglyceridemia: Continue Crestor.  Diabetes mellitus type 2: No change made to his regimen continue current regimen at home.  Anxiety and depression: No changes made to his medication.  Discharge Instructions  Discharge Instructions     Diet - low sodium heart healthy   Complete by: As directed    Increase activity slowly   Complete by: As directed       Allergies as of 04/22/2021       Reactions   Meperidine Anaphylaxis, Other (See Comments)   Confusion and behavioral changes (makes patient things negative things)    Latex Rash   Tape Rash, Other (See Comments)   Can stay on for ONLY a limited length of time        Medication List     TAKE these medications    Ajovy 225 MG/1.5ML Soaj Generic drug: Fremanezumab-vfrm Inject 1.5 mLs into the skin every 30 (thirty) days.   ARIPiprazole 5 MG tablet Commonly known as: ABILIFY Take 5 mg by mouth daily.   aspirin EC 81 MG tablet Take 81 mg by mouth daily with lunch.   Centrum Men Tabs Take 1 tablet by mouth daily with breakfast.   clopidogrel 75 MG tablet Commonly known as: PLAVIX Take 75 mg by mouth daily.   dapagliflozin propanediol 10 MG Tabs tablet Commonly known as: FARXIGA Take 10 mg by mouth daily.   dicyclomine 10 MG capsule Commonly known as: BENTYL Take 10 mg by mouth in the morning and at bedtime.   escitalopram 20 MG tablet Commonly known as: LEXAPRO Take 20 mg by mouth daily.   fenofibrate 145 MG tablet Commonly known as: TRICOR Take 145 mg by mouth daily.   folic acid 259 MCG tablet Commonly known as: FOLVITE Take 800 mcg by mouth daily with breakfast.   gabapentin 400 MG capsule Commonly known as: Neurontin Take 1 capsule (400 mg total) by mouth 3 (three) times daily.   levocetirizine 5 MG tablet Commonly known as: XYZAL Take 5 mg by mouth at bedtime.   losartan 25 MG tablet Commonly known as: COZAAR Take 25 mg by mouth daily.   meloxicam 7.5 MG tablet Commonly known as: Mobic Take 1 tablet (7.5 mg total) by mouth daily.   metFORMIN 1000 MG tablet Commonly known as: GLUCOPHAGE Take 1,000  mg by mouth 2 (two) times daily with a meal.   methocarbamol 500 MG tablet Commonly known as: Robaxin Take 1 tablet (500 mg total) by mouth every 8 (eight) hours as needed for muscle spasms.   nitroGLYCERIN 0.4 MG SL tablet Commonly known as: NITROSTAT Place 0.4 mg under the tongue every 5 (five) minutes as needed for chest pain.   omeprazole 20 MG capsule Commonly known as: PRILOSEC Take 20 mg by mouth 2  (two) times daily before a meal.   Ozempic (1 MG/DOSE) 4 MG/3ML Sopn Generic drug: Semaglutide (1 MG/DOSE) Inject 1 mg into the skin once a week. On Sunday   rosuvastatin 40 MG tablet Commonly known as: CRESTOR Take 40 mg by mouth daily.   topiramate 50 MG tablet Commonly known as: TOPAMAX Take 50 mg by mouth 2 (two) times daily.   Ubrelvy 100 MG Tabs Generic drug: Ubrogepant Take 100 mg by mouth every 2 (two) hours as needed. Maximum 200mg  a day.        Allergies  Allergen Reactions   Meperidine Anaphylaxis and Other (See Comments)    Confusion and behavioral changes (makes patient things negative things)     Latex Rash   Tape Rash and Other (See Comments)    Can stay on for ONLY a limited length of time    Consultations: Cardiology   Procedures/Studies: CT CORONARY MORPH W/CTA COR W/SCORE W/CA W/CM &/OR WO/CM  Addendum Date: 04/21/2021   ADDENDUM REPORT: 04/21/2021 13:07 HISTORY: Chest pain, nonspecific EXAM: Cardiac/Coronary CT TECHNIQUE: The patient was scanned on a [employer]. PROTOCOL: A 130 kV prospective scan was triggered in the descending thoracic aorta at 111 HU's. Axial non-contrast 3 mm slices were carried out through the heart. The data set was analyzed on a dedicated work station and scored using the [place]. Gantry rotation speed was 250 msecs and collimation was 0.6 mm. Heart rate optimized medically, and 0.8 mg of sublingual nitroglycerin was given. The 3D data set was reconstructed in 5% intervals of 35-75% of the R-R cycle. Diastolic phases were analyzed on a dedicated work station using MPR, MIP and VRT modes. The patient received 74mL OMNIPAQUE IOHEXOL 350 MG/ML SOLN of contrast. FINDINGS: Coronary calcium score: The patient's coronary artery calcium score is 772, which places the patient in the 98th percentile. Coronary arteries: Normal coronary origins.  Right dominance. Right Coronary Artery: Normal caliber vessel, gives rise to PDA.  Predominantly noncalcified plaque in proximal RCA with 1-24% stenosis. Mixed calcified and noncalcified plaque in distal RCA with 1-24% stenosis. Left Main Coronary Artery: Normal caliber vessel. There is mixed calcified and noncalcified plaque in the mid to distal left main with 25-49% stenosis. Left Anterior Descending Coronary Artery: Normal caliber vessel. There is diffuse mixed calcified and noncalcified plaque in the proximal LAD, with highest grade stenosis 50-69%. There is an area in the mid LAD with noncalcified plaque with at least 50-69% stenosis, and higher degree stenosis cannot be excluded. There is mixed calcified and noncalcified plaque in the distal LAD with at least 25-49% stenosis. Gives rise to medium D1 and D2 branches. Left Circumflex Artery: Normal caliber vessel. Mixed calcified and noncalcified plaque in the proximal Lcx with 25-49% stenosis. Gives rise to 1 OM branch. Aorta: Normal size, 31 mm at the mid ascending aorta (level of the PA bifurcation) measured double oblique. No dissection seen in visualized portions of the aorta. Aortic Valve: No calcifications. Trileaflet. Other findings: Normal pulmonary vein drainage into the left atrium.  Normal left atrial appendage without a thrombus. Normal size of the pulmonary artery. Normal appearance of the pericardium. IMPRESSION: 1. Moderate CAD, CADRADS = 3. Diffuse mixed plaque, with largest burden in the LAD. CT FFR will be performed and reported separately. 2. Coronary calcium score of 772. This was 98th percentile for age and sex matched control. 3. Normal coronary origin with right dominance. INTERPRETATION: 1. CAD-RADS 0: No evidence of CAD (0%). Consider non-atherosclerotic causes of chest pain. 2. CAD-RADS 1: Minimal non-obstructive CAD (0-24%). Consider non-atherosclerotic causes of chest pain. Consider preventive therapy and risk factor modification. 3. CAD-RADS 2: Mild non-obstructive CAD (25-49%). Consider non-atherosclerotic causes  of chest pain. Consider preventive therapy and risk factor modification. 4. CAD-RADS 3: Moderate stenosis (50-69%). Consider symptom-guided anti-ischemic pharmacotherapy as well as risk factor modification per guideline directed care. Additional analysis with CT FFR will be submitted. 5. CAD-RADS 4: Severe stenosis. (70-99% or > 50% left main). Cardiac catheterization or CT FFR is recommended. Consider symptom-guided anti-ischemic pharmacotherapy as well as risk factor modification per guideline directed care. Invasive coronary angiography recommended with revascularization per published guideline statements. 6. CAD-RADS 5: Total coronary occlusion (100%). Consider cardiac catheterization or viability assessment. Consider symptom-guided anti-ischemic pharmacotherapy as well as risk factor modification per guideline directed care. 7. CAD-RADS N: Non-diagnostic study. Obstructive CAD can't be excluded. Alternative evaluation is recommended. Electronically Signed   By: Buford Dresser M.D.   On: 04/21/2021 13:07   Result Date: 04/21/2021 EXAM: OVER-READ INTERPRETATION  CT CHEST The following report is an over-read performed by radiologist Dr. Vinnie Langton of [facility], [place] on 04/21/2021. This over-read does not include interpretation of cardiac or coronary anatomy or pathology. The coronary calcium score/coronary CTA interpretation by the cardiologist is attached. COMPARISON:  None. FINDINGS: Within the visualized portions of the thorax there are no suspicious appearing pulmonary nodules or masses, there is no acute consolidative airspace disease, no pleural effusions, no pneumothorax and no lymphadenopathy. Visualized portions of the upper abdomen are unremarkable. There are no aggressive appearing lytic or blastic lesions noted in the visualized portions of the skeleton. IMPRESSION: 1. No significant incidental noncardiac findings are noted. Electronically Signed: By: Vinnie Langton M.D. On:  04/21/2021 12:34   DG Chest Portable 1 View  Result Date: 04/20/2021 CLINICAL DATA:  Chest pain for 2 hours EXAM: PORTABLE CHEST 1 VIEW COMPARISON:  01/11/2017 FINDINGS: Cardiac shadow is prominent but accentuated by the frontal technique. The lungs are clear. No bony abnormality is noted. IMPRESSION: No acute abnormality noted. Electronically Signed   By: Inez Catalina M.D.   On: 04/20/2021 18:22   CT CORONARY FRACTIONAL FLOW RESERVE DATA PREP  Result Date: 04/21/2021 EXAM: CT FFR ANALYSIS CLINICAL DATA:  Chest pain, nonspecific FINDINGS: FFRct analysis was performed on the original cardiac CT angiogram dataset. Diagrammatic representation of the FFRct analysis is provided in a separate PDF document in PACS. This dictation was created using the PDF document and an interactive 3D model of the results. 3D model is not available in the EMR/PACS. Normal FFR range is >0.80. 1. Left Main:  No significant stenosis. FFR = 1.00 2. LAD: No significant stenosis. Proximal FFR =0.99, Mid FFR =0.91, Distal FFR = 0.78 without a focal stenosis/dropoff 3. LCX: No significant stenosis. Proximal FFR = 0.99, Distal FFR = 0.99 4. RCA: No significant stenosis. Proximal FFR = 0.98, Mid FFR =0.93, Distal FFR = 0.92 IMPRESSION: 1.  CT FFR analysis did not show any significant focal stenosis. Electronically Signed   By:  Buford Dresser M.D.   On: 04/21/2021 15:54   ECHOCARDIOGRAM COMPLETE  Result Date: 04/21/2021    ECHOCARDIOGRAM REPORT   Patient Name:   Jared Wood  Date of Exam: 04/21/2021 Medical Rec #:  [id number]  Height:       69.0 in Accession #:    [id number] Weight:       260.0 lb Date of Birth:  05/10/1966 BSA:          2.310 m Patient Age:    20 years   BP:           103/66 mmHg Patient Gender: M          HR:           58 bpm. Exam Location:  Inpatient Procedure: 2D Echo, Color Doppler, Cardiac Doppler and Intracardiac            Opacification Agent Indications:    R07.9* Chest pain, unspecified  History:         Patient has no prior history of Echocardiogram examinations.                 Stroke; Risk Factors:Dyslipidemia, Hypertension and Diabetes.  Sonographer:    Bernadene Person RDCS Referring Phys: [id number] [place]  1. Left ventricular ejection fraction, by estimation, is 60 to 65%. The left ventricle has normal function. The left ventricle has no regional wall motion abnormalities. Left ventricular diastolic parameters were normal.  2. Right ventricular systolic function is normal. The right ventricular size is normal.  3. Left atrial size was mildly dilated.  4. The mitral valve is normal in structure. No evidence of mitral valve regurgitation. No evidence of mitral stenosis.  5. The aortic valve is normal in structure. Aortic valve regurgitation is not visualized. No aortic stenosis is present.  6. The inferior vena cava is normal in size with greater than 50% respiratory variability, suggesting right atrial pressure of 3 mmHg. FINDINGS  Left Ventricle: Left ventricular ejection fraction, by estimation, is 60 to 65%. The left ventricle has normal function. The left ventricle has no regional wall motion abnormalities. Definity contrast agent was given IV to delineate the left ventricular  endocardial borders. The left ventricular internal cavity size was normal in size. There is no left ventricular hypertrophy. Left ventricular diastolic parameters were normal. Normal left ventricular filling pressure. Right Ventricle: The right ventricular size is normal. No increase in right ventricular wall thickness. Right ventricular systolic function is normal. Left Atrium: Left atrial size was mildly dilated. Right Atrium: Right atrial size was normal in size. Pericardium: There is no evidence of pericardial effusion. Mitral Valve: The mitral valve is normal in structure. No evidence of mitral valve regurgitation. No evidence of mitral valve stenosis. Tricuspid Valve: The tricuspid valve is normal in structure.  Tricuspid valve regurgitation is not demonstrated. No evidence of tricuspid stenosis. Aortic Valve: The aortic valve is normal in structure. Aortic valve regurgitation is not visualized. No aortic stenosis is present. Pulmonic Valve: The pulmonic valve was normal in structure. Pulmonic valve regurgitation is not visualized. No evidence of pulmonic stenosis. Aorta: The aortic root is normal in size and structure. Venous: The inferior vena cava is normal in size with greater than 50% respiratory variability, suggesting right atrial pressure of 3 mmHg. IAS/Shunts: No atrial level shunt detected by color flow Doppler.  LEFT VENTRICLE PLAX 2D LVIDd:         5.00 cm  Diastology LVIDs:  3.50 cm  LV e' medial:    9.38 cm/s LV PW:         1.00 cm  LV E/e' medial:  9.1 LV IVS:        1.10 cm  LV e' lateral:   12.40 cm/s LVOT diam:     2.00 cm  LV E/e' lateral: 6.9 LV SV:         74 LV SV Index:   32 LVOT Area:     3.14 cm  LEFT ATRIUM             Index       RIGHT ATRIUM           Index LA diam:        3.80 cm 1.65 cm/m  RA Area:     16.90 cm LA Vol (A2C):   61.3 ml 26.54 ml/m RA Volume:   44.20 ml  19.14 ml/m LA Vol (A4C):   66.7 ml 28.88 ml/m LA Biplane Vol: 65.9 ml 28.53 ml/m  AORTIC VALVE LVOT Vmax:   100.00 cm/s LVOT Vmean:  66.500 cm/s LVOT VTI:    0.237 m  AORTA Ao Root diam: 3.50 cm Ao Asc diam:  3.40 cm MITRAL VALVE MV Area (PHT): 4.21 cm    SHUNTS MV Decel Time: 180 msec    Systemic VTI:  0.24 m MV E velocity: 85.40 cm/s  Systemic Diam: 2.00 cm MV A velocity: 52.10 cm/s MV E/A ratio:  1.64 Fransico Him MD Electronically signed by Fransico Him MD Signature Date/Time: 04/21/2021/10:40:10 AM    Final     Subjective: No complaints  Discharge Exam: Vitals:   04/22/21 0012 04/22/21 0738  BP: (!) 108/56 114/75  Pulse: 66 69  Resp: 16 14  Temp: 98 F (36.7 C) 98.2 F (36.8 C)  SpO2: 94%    Vitals:   04/21/21 1857 04/21/21 2031 04/22/21 0012 04/22/21 0738  BP: 112/62 110/60 (!) 108/56 114/75   Pulse: 68 70 66 69  Resp:  17 16 14   Temp: 97.8 F (36.6 C) 98 F (36.7 C) 98 F (36.7 C) 98.2 F (36.8 C)  TempSrc: Oral Oral Oral Oral  SpO2: 95% 95% 94%   Weight:      Height:        General: Pt is alert, awake, not in acute distress Cardiovascular: RRR, S1/S2 +, no rubs, no gallops Respiratory: CTA bilaterally, no wheezing, no rhonchi Abdominal: Soft, NT, ND, bowel sounds + Extremities: no edema, no cyanosis    The results of significant diagnostics from this hospitalization (including imaging, microbiology, ancillary and laboratory) are listed below for reference.     Microbiology: Recent Results (from the past 240 hour(s))  Resp Panel by RT-PCR (Flu A&B, Covid) Nasopharyngeal Swab     Status: None   Collection Time: 04/20/21  5:40 PM   Specimen: Nasopharyngeal Swab; Nasopharyngeal(NP) swabs in vial transport medium  Result Value Ref Range Status   SARS Coronavirus 2 by RT PCR NEGATIVE NEGATIVE Final    Comment: (NOTE) SARS-CoV-2 target nucleic acids are NOT DETECTED.  The SARS-CoV-2 RNA is generally detectable in upper respiratory specimens during the acute phase of infection. The lowest concentration of SARS-CoV-2 viral copies this assay can detect is 138 copies/mL. A negative result does not preclude SARS-Cov-2 infection and should not be used as the sole basis for treatment or other patient management decisions. A negative result may occur with  improper specimen collection/handling, submission of specimen other than nasopharyngeal swab,  presence of viral mutation(s) within the areas targeted by this assay, and inadequate number of viral copies(<138 copies/mL). A negative result must be combined with clinical observations, patient history, and epidemiological information. The expected result is Negative.  Fact Sheet for Patients:  EntrepreneurPulse.com.au  Fact Sheet for Healthcare Providers:   IncredibleEmployment.be  This test is no t yet approved or cleared by the [place] FDA and  has been authorized for detection and/or diagnosis of SARS-CoV-2 by FDA under an Emergency Use Authorization (EUA). This EUA will remain  in effect (meaning this test can be used) for the duration of the COVID-19 declaration under Section 564(b)(1) of the Act, 21 U.S.C.section 360bbb-3(b)(1), unless the authorization is terminated  or revoked sooner.       Influenza A by PCR NEGATIVE NEGATIVE Final   Influenza B by PCR NEGATIVE NEGATIVE Final    Comment: (NOTE) The Xpert Xpress SARS-CoV-2/FLU/RSV plus assay is intended as an aid in the diagnosis of influenza from Nasopharyngeal swab specimens and should not be used as a sole basis for treatment. Nasal washings and aspirates are unacceptable for Xpert Xpress SARS-CoV-2/FLU/RSV testing.  Fact Sheet for Patients: EntrepreneurPulse.com.au  Fact Sheet for Healthcare Providers: IncredibleEmployment.be  This test is not yet approved or cleared by the [place] FDA and has been authorized for detection and/or diagnosis of SARS-CoV-2 by FDA under an Emergency Use Authorization (EUA). This EUA will remain in effect (meaning this test can be used) for the duration of the COVID-19 declaration under Section 564(b)(1) of the Act, 21 U.S.C. section 360bbb-3(b)(1), unless the authorization is terminated or revoked.  Performed at [facility], [place] [address] [place], [place] [address]      Labs: BNP (last 3 results) No results for input(s): BNP in the last 8760 hours. Basic Metabolic Panel: Recent Labs  Lab 04/20/21 1728 04/21/21 0455  NA 139 139  K 3.6 4.0  CL 108 108  CO2 21* 23  GLUCOSE 216* 226*  BUN 15 14  CREATININE 0.85 0.86  CALCIUM 8.8* 9.4   Liver Function Tests: Recent Labs  Lab 04/20/21 1728  AST 27  ALT 35  ALKPHOS 45  BILITOT 0.5  PROT 6.0*   ALBUMIN 3.5   No results for input(s): LIPASE, AMYLASE in the last 168 hours. No results for input(s): AMMONIA in the last 168 hours. CBC: Recent Labs  Lab 04/20/21 1728 04/21/21 0455  WBC 5.4 5.9  NEUTROABS 3.0  --   HGB 12.3* 13.3  HCT 38.1* 41.1  MCV 85.8 86.2  PLT 215 243   Cardiac Enzymes: No results for input(s): CKTOTAL, CKMB, CKMBINDEX, TROPONINI in the last 168 hours. BNP: Invalid input(s): POCBNP CBG: Recent Labs  Lab 04/21/21 0828 04/21/21 1326 04/21/21 1624  GLUCAP 162* 172* 189*   D-Dimer Recent Labs    04/20/21 1728  DDIMER <0.27   Hgb A1c Recent Labs    04/21/21 0455  HGBA1C 7.6*   Lipid Profile Recent Labs    04/21/21 0455  CHOL 118  HDL 27*  LDLCALC UNABLE TO CALCULATE IF TRIGLYCERIDE OVER 400 mg/dL  TRIG 413*  CHOLHDL 4.4  LDLDIRECT 51.6   Thyroid function studies No results for input(s): TSH, T4TOTAL, T3FREE, THYROIDAB in the last 72 hours.  Invalid input(s): FREET3 Anemia work up No results for input(s): VITAMINB12, FOLATE, FERRITIN, TIBC, IRON, RETICCTPCT in the last 72 hours. Urinalysis    Component Value Date/Time   COLORURINE YELLOW 11/21/2015 [place] 11/21/2015 1321  LABSPEC 1.039 (H) 11/21/2015 1321   PHURINE 5.5 11/21/2015 1321   GLUCOSEU >1000 (A) 11/21/2015 1321   HGBUR NEGATIVE 11/21/2015 1321   BILIRUBINUR NEGATIVE 11/21/2015 1321   KETONESUR NEGATIVE 11/21/2015 1321   PROTEINUR NEGATIVE 11/21/2015 1321   NITRITE NEGATIVE 11/21/2015 1321   LEUKOCYTESUR NEGATIVE 11/21/2015 1321   Sepsis Labs Invalid input(s): PROCALCITONIN,  WBC,  LACTICIDVEN Microbiology Recent Results (from the past 240 hour(s))  Resp Panel by RT-PCR (Flu A&B, Covid) Nasopharyngeal Swab     Status: None   Collection Time: 04/20/21  5:40 PM   Specimen: Nasopharyngeal Swab; Nasopharyngeal(NP) swabs in vial transport medium  Result Value Ref Range Status   SARS Coronavirus 2 by RT PCR NEGATIVE NEGATIVE Final    Comment:  (NOTE) SARS-CoV-2 target nucleic acids are NOT DETECTED.  The SARS-CoV-2 RNA is generally detectable in upper respiratory specimens during the acute phase of infection. The lowest concentration of SARS-CoV-2 viral copies this assay can detect is 138 copies/mL. A negative result does not preclude SARS-Cov-2 infection and should not be used as the sole basis for treatment or other patient management decisions. A negative result may occur with  improper specimen collection/handling, submission of specimen other than nasopharyngeal swab, presence of viral mutation(s) within the areas targeted by this assay, and inadequate number of viral copies(<138 copies/mL). A negative result must be combined with clinical observations, patient history, and epidemiological information. The expected result is Negative.  Fact Sheet for Patients:  EntrepreneurPulse.com.au  Fact Sheet for Healthcare Providers:  IncredibleEmployment.be  This test is no t yet approved or cleared by the [place] FDA and  has been authorized for detection and/or diagnosis of SARS-CoV-2 by FDA under an Emergency Use Authorization (EUA). This EUA will remain  in effect (meaning this test can be used) for the duration of the COVID-19 declaration under Section 564(b)(1) of the Act, 21 U.S.C.section 360bbb-3(b)(1), unless the authorization is terminated  or revoked sooner.       Influenza A by PCR NEGATIVE NEGATIVE Final   Influenza B by PCR NEGATIVE NEGATIVE Final    Comment: (NOTE) The Xpert Xpress SARS-CoV-2/FLU/RSV plus assay is intended as an aid in the diagnosis of influenza from Nasopharyngeal swab specimens and should not be used as a sole basis for treatment. Nasal washings and aspirates are unacceptable for Xpert Xpress SARS-CoV-2/FLU/RSV testing.  Fact Sheet for Patients: EntrepreneurPulse.com.au  Fact Sheet for Healthcare  Providers: IncredibleEmployment.be  This test is not yet approved or cleared by the [place] FDA and has been authorized for detection and/or diagnosis of SARS-CoV-2 by FDA under an Emergency Use Authorization (EUA). This EUA will remain in effect (meaning this test can be used) for the duration of the COVID-19 declaration under Section 564(b)(1) of the Act, 21 U.S.C. section 360bbb-3(b)(1), unless the authorization is terminated or revoked.  Performed at [facility], [place] [address] [place], [place] [address]      Time coordinating discharge: Over 30 minutes  SIGNED:   [name], MD  [facility] 04/22/2021, 9:34 AM Pager   If 7PM-7AM, please contact night-coverage [email or web address] Password [email or web address]

## 2021-04-22 NOTE — Progress Notes (Addendum)
After Ive gone over dc instructions, all of a sudden patient asked when he can go back to work.RN Bonnita Nasuti made aware- will contact MD

## 2021-04-22 NOTE — TOC Benefit Eligibility Note (Signed)
Patient Teacher, English as a foreign language completed.    The patient is currently admitted and upon discharge could be taking Vascepa 1 g.  Prior Authorization Required  The patient is insured through [place], [place] Patient Advocate Specialist [place] Team Direct Number: [phone]  Fax: [phone]

## 2021-04-22 NOTE — TOC Benefit Eligibility Note (Signed)
Patient Advocate Encounter   Received notification that prior authorization for Vascepa 1 gm is required.   PA submitted on 04/22/2021 Key [id number] Status is pending       Lyndel Safe, CPhT Pharmacy Patient Advocate Specialist [facility] Antimicrobial Stewardship Team Direct Number: [phone]  Fax: [phone]

## 2021-04-22 NOTE — Progress Notes (Addendum)
Progress Note  Patient Name: Jared Wood Date of Encounter: 04/22/2021  [place] Cardiologist: None   Subjective   No chest pain this morning.   Inpatient Medications    Scheduled Meds:  ARIPiprazole  5 mg Oral Daily   aspirin EC  81 mg Oral Q lunch   clopidogrel  75 mg Oral Daily   dapagliflozin propanediol  10 mg Oral Daily   enoxaparin (LOVENOX) injection  40 mg Subcutaneous Q24H   escitalopram  20 mg Oral Daily   fenofibrate  54 mg Oral Daily   gabapentin  400 mg Oral TID   insulin aspart  0-9 Units Subcutaneous TID WC   losartan  25 mg Oral Daily   pantoprazole  40 mg Oral Daily   rosuvastatin  40 mg Oral Daily   topiramate  50 mg Oral BID   Continuous Infusions:  lactated ringers 100 mL/hr at 04/22/21 0303   PRN Meds: acetaminophen, nitroGLYCERIN, ondansetron (ZOFRAN) IV   Vital Signs    Vitals:   04/21/21 1857 04/21/21 2031 04/22/21 0012 04/22/21 0738  BP: 112/62 110/60 (!) 108/56 114/75  Pulse: 68 70 66 69  Resp:  17 16 14   Temp: 97.8 F (36.6 C) 98 F (36.7 C) 98 F (36.7 C) 98.2 F (36.8 C)  TempSrc: Oral Oral Oral Oral  SpO2: 95% 95% 94%   Weight:      Height:        Intake/Output Summary (Last 24 hours) at 04/22/2021 0948 Last data filed at 04/22/2021 0926 Gross per 24 hour  Intake 1042.65 ml  Output 2000 ml  Net -957.35 ml   Last 3 Weights 04/20/2021 02/19/2021 12/09/2020  Weight (lbs) 260 lb 253 lb 267 lb 3.2 oz  Weight (kg) 117.935 kg 114.76 kg 121.201 kg      Telemetry    SR - Personally Reviewed  ECG    No new tracing.  Physical Exam   GEN: No acute distress.   Neck: No JVD Cardiac: RRR, no murmurs, rubs, or gallops.  Respiratory: Clear to auscultation bilaterally. GI: Soft, nontender, non-distended  MS: No edema; No deformity. Neuro:  Nonfocal  Psych: Normal affect   Labs    High Sensitivity Troponin:   Recent Labs  Lab 04/20/21 1728 04/20/21 1955  TROPONINIHS 3 3     Chemistry Recent Labs  Lab  04/20/21 1728 04/21/21 0455  NA 139 139  K 3.6 4.0  CL 108 108  CO2 21* 23  GLUCOSE 216* 226*  BUN 15 14  CREATININE 0.85 0.86  CALCIUM 8.8* 9.4  PROT 6.0*  --   ALBUMIN 3.5  --   AST 27  --   ALT 35  --   ALKPHOS 45  --   BILITOT 0.5  --   GFRNONAA >60 >60  ANIONGAP 10 8    Lipids  Recent Labs  Lab 04/21/21 0455  CHOL 118  TRIG 413*  HDL 27*  LDLCALC UNABLE TO CALCULATE IF TRIGLYCERIDE OVER 400 mg/dL  CHOLHDL 4.4    Hematology Recent Labs  Lab 04/20/21 1728 04/21/21 0455  WBC 5.4 5.9  RBC 4.44 4.77  HGB 12.3* 13.3  HCT 38.1* 41.1  MCV 85.8 86.2  MCH 27.7 27.9  MCHC 32.3 32.4  RDW 13.2 13.4  PLT 215 243   Thyroid No results for input(s): TSH, FREET4 in the last 168 hours.  BNPNo results for input(s): BNP, PROBNP in the last 168 hours.  DDimer  Recent Labs  Lab 04/20/21  44  DDIMER <0.27     Radiology    CT CORONARY MORPH W/CTA COR W/SCORE W/CA W/CM &/OR WO/CM  Addendum Date: 04/21/2021   ADDENDUM REPORT: 04/21/2021 13:07 HISTORY: Chest pain, nonspecific EXAM: Cardiac/Coronary CT TECHNIQUE: The patient was scanned on a [employer]. PROTOCOL: A 130 kV prospective scan was triggered in the descending thoracic aorta at 111 HU's. Axial non-contrast 3 mm slices were carried out through the heart. The data set was analyzed on a dedicated work station and scored using the [place]. Gantry rotation speed was 250 msecs and collimation was 0.6 mm. Heart rate optimized medically, and 0.8 mg of sublingual nitroglycerin was given. The 3D data set was reconstructed in 5% intervals of 35-75% of the R-R cycle. Diastolic phases were analyzed on a dedicated work station using MPR, MIP and VRT modes. The patient received 20mL OMNIPAQUE IOHEXOL 350 MG/ML SOLN of contrast. FINDINGS: Coronary calcium score: The patient's coronary artery calcium score is 772, which places the patient in the 98th percentile. Coronary arteries: Normal coronary origins.  Right  dominance. Right Coronary Artery: Normal caliber vessel, gives rise to PDA. Predominantly noncalcified plaque in proximal RCA with 1-24% stenosis. Mixed calcified and noncalcified plaque in distal RCA with 1-24% stenosis. Left Main Coronary Artery: Normal caliber vessel. There is mixed calcified and noncalcified plaque in the mid to distal left main with 25-49% stenosis. Left Anterior Descending Coronary Artery: Normal caliber vessel. There is diffuse mixed calcified and noncalcified plaque in the proximal LAD, with highest grade stenosis 50-69%. There is an area in the mid LAD with noncalcified plaque with at least 50-69% stenosis, and higher degree stenosis cannot be excluded. There is mixed calcified and noncalcified plaque in the distal LAD with at least 25-49% stenosis. Gives rise to medium D1 and D2 branches. Left Circumflex Artery: Normal caliber vessel. Mixed calcified and noncalcified plaque in the proximal Lcx with 25-49% stenosis. Gives rise to 1 OM branch. Aorta: Normal size, 31 mm at the mid ascending aorta (level of the PA bifurcation) measured double oblique. No dissection seen in visualized portions of the aorta. Aortic Valve: No calcifications. Trileaflet. Other findings: Normal pulmonary vein drainage into the left atrium. Normal left atrial appendage without a thrombus. Normal size of the pulmonary artery. Normal appearance of the pericardium. IMPRESSION: 1. Moderate CAD, CADRADS = 3. Diffuse mixed plaque, with largest burden in the LAD. CT FFR will be performed and reported separately. 2. Coronary calcium score of 772. This was 98th percentile for age and sex matched control. 3. Normal coronary origin with right dominance. INTERPRETATION: 1. CAD-RADS 0: No evidence of CAD (0%). Consider non-atherosclerotic causes of chest pain. 2. CAD-RADS 1: Minimal non-obstructive CAD (0-24%). Consider non-atherosclerotic causes of chest pain. Consider preventive therapy and risk factor modification. 3.  CAD-RADS 2: Mild non-obstructive CAD (25-49%). Consider non-atherosclerotic causes of chest pain. Consider preventive therapy and risk factor modification. 4. CAD-RADS 3: Moderate stenosis (50-69%). Consider symptom-guided anti-ischemic pharmacotherapy as well as risk factor modification per guideline directed care. Additional analysis with CT FFR will be submitted. 5. CAD-RADS 4: Severe stenosis. (70-99% or > 50% left main). Cardiac catheterization or CT FFR is recommended. Consider symptom-guided anti-ischemic pharmacotherapy as well as risk factor modification per guideline directed care. Invasive coronary angiography recommended with revascularization per published guideline statements. 6. CAD-RADS 5: Total coronary occlusion (100%). Consider cardiac catheterization or viability assessment. Consider symptom-guided anti-ischemic pharmacotherapy as well as risk factor modification per guideline directed care. 7. CAD-RADS N: Non-diagnostic study.  Obstructive CAD can't be excluded. Alternative evaluation is recommended. Electronically Signed   By: Buford Dresser M.D.   On: 04/21/2021 13:07   Result Date: 04/21/2021 EXAM: OVER-READ INTERPRETATION  CT CHEST The following report is an over-read performed by radiologist Dr. Vinnie Langton of [facility], [place] on 04/21/2021. This over-read does not include interpretation of cardiac or coronary anatomy or pathology. The coronary calcium score/coronary CTA interpretation by the cardiologist is attached. COMPARISON:  None. FINDINGS: Within the visualized portions of the thorax there are no suspicious appearing pulmonary nodules or masses, there is no acute consolidative airspace disease, no pleural effusions, no pneumothorax and no lymphadenopathy. Visualized portions of the upper abdomen are unremarkable. There are no aggressive appearing lytic or blastic lesions noted in the visualized portions of the skeleton. IMPRESSION: 1. No significant incidental  noncardiac findings are noted. Electronically Signed: By: Vinnie Langton M.D. On: 04/21/2021 12:34   DG Chest Portable 1 View  Result Date: 04/20/2021 CLINICAL DATA:  Chest pain for 2 hours EXAM: PORTABLE CHEST 1 VIEW COMPARISON:  01/11/2017 FINDINGS: Cardiac shadow is prominent but accentuated by the frontal technique. The lungs are clear. No bony abnormality is noted. IMPRESSION: No acute abnormality noted. Electronically Signed   By: Inez Catalina M.D.   On: 04/20/2021 18:22   CT CORONARY FRACTIONAL FLOW RESERVE DATA PREP  Result Date: 04/21/2021 EXAM: CT FFR ANALYSIS CLINICAL DATA:  Chest pain, nonspecific FINDINGS: FFRct analysis was performed on the original cardiac CT angiogram dataset. Diagrammatic representation of the FFRct analysis is provided in a separate PDF document in PACS. This dictation was created using the PDF document and an interactive 3D model of the results. 3D model is not available in the EMR/PACS. Normal FFR range is >0.80. 1. Left Main:  No significant stenosis. FFR = 1.00 2. LAD: No significant stenosis. Proximal FFR =0.99, Mid FFR =0.91, Distal FFR = 0.78 without a focal stenosis/dropoff 3. LCX: No significant stenosis. Proximal FFR = 0.99, Distal FFR = 0.99 4. RCA: No significant stenosis. Proximal FFR = 0.98, Mid FFR =0.93, Distal FFR = 0.92 IMPRESSION: 1.  CT FFR analysis did not show any significant focal stenosis. Electronically Signed   By: Buford Dresser M.D.   On: 04/21/2021 15:54   ECHOCARDIOGRAM COMPLETE  Result Date: 04/21/2021    ECHOCARDIOGRAM REPORT   Patient Name:   Jared Wood  Date of Exam: 04/21/2021 Medical Rec #:  [id number]  Height:       69.0 in Accession #:    [id number] Weight:       260.0 lb Date of Birth:  1966/02/19 BSA:          2.310 m Patient Age:    20 years   BP:           103/66 mmHg Patient Gender: M          HR:           58 bpm. Exam Location:  Inpatient Procedure: 2D Echo, Color Doppler, Cardiac Doppler and Intracardiac             Opacification Agent Indications:    R07.9* Chest pain, unspecified  History:        Patient has no prior history of Echocardiogram examinations.                 Stroke; Risk Factors:Dyslipidemia, Hypertension and Diabetes.  Sonographer:    Bernadene Person RDCS Referring Phys: [id number] [place]  1. Left ventricular ejection  fraction, by estimation, is 60 to 65%. The left ventricle has normal function. The left ventricle has no regional wall motion abnormalities. Left ventricular diastolic parameters were normal.  2. Right ventricular systolic function is normal. The right ventricular size is normal.  3. Left atrial size was mildly dilated.  4. The mitral valve is normal in structure. No evidence of mitral valve regurgitation. No evidence of mitral stenosis.  5. The aortic valve is normal in structure. Aortic valve regurgitation is not visualized. No aortic stenosis is present.  6. The inferior vena cava is normal in size with greater than 50% respiratory variability, suggesting right atrial pressure of 3 mmHg. FINDINGS  Left Ventricle: Left ventricular ejection fraction, by estimation, is 60 to 65%. The left ventricle has normal function. The left ventricle has no regional wall motion abnormalities. Definity contrast agent was given IV to delineate the left ventricular  endocardial borders. The left ventricular internal cavity size was normal in size. There is no left ventricular hypertrophy. Left ventricular diastolic parameters were normal. Normal left ventricular filling pressure. Right Ventricle: The right ventricular size is normal. No increase in right ventricular wall thickness. Right ventricular systolic function is normal. Left Atrium: Left atrial size was mildly dilated. Right Atrium: Right atrial size was normal in size. Pericardium: There is no evidence of pericardial effusion. Mitral Valve: The mitral valve is normal in structure. No evidence of mitral valve regurgitation. No evidence of  mitral valve stenosis. Tricuspid Valve: The tricuspid valve is normal in structure. Tricuspid valve regurgitation is not demonstrated. No evidence of tricuspid stenosis. Aortic Valve: The aortic valve is normal in structure. Aortic valve regurgitation is not visualized. No aortic stenosis is present. Pulmonic Valve: The pulmonic valve was normal in structure. Pulmonic valve regurgitation is not visualized. No evidence of pulmonic stenosis. Aorta: The aortic root is normal in size and structure. Venous: The inferior vena cava is normal in size with greater than 50% respiratory variability, suggesting right atrial pressure of 3 mmHg. IAS/Shunts: No atrial level shunt detected by color flow Doppler.  LEFT VENTRICLE PLAX 2D LVIDd:         5.00 cm  Diastology LVIDs:         3.50 cm  LV e' medial:    9.38 cm/s LV PW:         1.00 cm  LV E/e' medial:  9.1 LV IVS:        1.10 cm  LV e' lateral:   12.40 cm/s LVOT diam:     2.00 cm  LV E/e' lateral: 6.9 LV SV:         74 LV SV Index:   32 LVOT Area:     3.14 cm  LEFT ATRIUM             Index       RIGHT ATRIUM           Index LA diam:        3.80 cm 1.65 cm/m  RA Area:     16.90 cm LA Vol (A2C):   61.3 ml 26.54 ml/m RA Volume:   44.20 ml  19.14 ml/m LA Vol (A4C):   66.7 ml 28.88 ml/m LA Biplane Vol: 65.9 ml 28.53 ml/m  AORTIC VALVE LVOT Vmax:   100.00 cm/s LVOT Vmean:  66.500 cm/s LVOT VTI:    0.237 m  AORTA Ao Root diam: 3.50 cm Ao Asc diam:  3.40 cm MITRAL VALVE MV Area (PHT): 4.21 cm  SHUNTS MV Decel Time: 180 msec    Systemic VTI:  0.24 m MV E velocity: 85.40 cm/s  Systemic Diam: 2.00 cm MV A velocity: 52.10 cm/s MV E/A ratio:  1.64 Fransico Him MD Electronically signed by Fransico Him MD Signature Date/Time: 04/21/2021/10:40:10 AM    Final     Cardiac Studies   Echo: 04/21/21  IMPRESSIONS     1. Left ventricular ejection fraction, by estimation, is 60 to 65%. The  left ventricle has normal function. The left ventricle has no regional  wall motion  abnormalities. Left ventricular diastolic parameters were  normal.   2. Right ventricular systolic function is normal. The right ventricular  size is normal.   3. Left atrial size was mildly dilated.   4. The mitral valve is normal in structure. No evidence of mitral valve  regurgitation. No evidence of mitral stenosis.   5. The aortic valve is normal in structure. Aortic valve regurgitation is  not visualized. No aortic stenosis is present.   6. The inferior vena cava is normal in size with greater than 50%  respiratory variability, suggesting right atrial pressure of 3 mmHg.   FINDINGS   Left Ventricle: Left ventricular ejection fraction, by estimation, is 60  to 65%. The left ventricle has normal function. The left ventricle has no  regional wall motion abnormalities. Definity contrast agent was given IV  to delineate the left ventricular   endocardial borders. The left ventricular internal cavity size was normal  in size. There is no left ventricular hypertrophy. Left ventricular  diastolic parameters were normal. Normal left ventricular filling  pressure.   Right Ventricle: The right ventricular size is normal. No increase in  right ventricular wall thickness. Right ventricular systolic function is  normal.   Left Atrium: Left atrial size was mildly dilated.   Right Atrium: Right atrial size was normal in size.   Pericardium: There is no evidence of pericardial effusion.   Mitral Valve: The mitral valve is normal in structure. No evidence of  mitral valve regurgitation. No evidence of mitral valve stenosis.   Tricuspid Valve: The tricuspid valve is normal in structure. Tricuspid  valve regurgitation is not demonstrated. No evidence of tricuspid  stenosis.   Aortic Valve: The aortic valve is normal in structure. Aortic valve  regurgitation is not visualized. No aortic stenosis is present.   Pulmonic Valve: The pulmonic valve was normal in structure. Pulmonic valve   regurgitation is not visualized. No evidence of pulmonic stenosis.   Aorta: The aortic root is normal in size and structure.   Venous: The inferior vena cava is normal in size with greater than 50%  respiratory variability, suggesting right atrial pressure of 3 mmHg.   IAS/Shunts: No atrial level shunt detected by color flow Doppler.   Patient Profile     55 y.o. male  a hx of type 2 diabetes, hypertension, hyperlipidemia, migraine, anxiety with depression, stroke like symptoms in 2016, conversion disorder, chronic low back pain, medical noncompliance, gait abnormality, morbid obesity, who is being seen 04/21/2021 for the evaluation of chest pain at the request of Dr. Posey Pronto.  Assessment & Plan    Chest Pain: hsTn negative, EKG was non-ischemic. Underwent coronary CTA with FFR yesterday with diffuse 3v disease, coronary calcium score 772. FFR without focal lesion. Recommendations for medical therapy. Echo with EF of 60-65% with no rWMA.  -- on ASA, plavix 75 mg daily (in the setting of conversion disorder), Crestor 40mg  daily, losartan 25mg  daily  HTN: stable with losartan  HLD/hypertriglyceridemia: on crestor 40mg  daily, fenofibrate. Will have pharmacy check [place]. Can start at outpatient if feasible.   DM: Hgb A1c 7.6 -- on metformin, farxiga and ozempic   Morbid obesity: lifestyle modification   [facility] will sign off.   Medication Recommendations:  noted above Other recommendations (labs, testing, etc):  NA Follow up as an outpatient:  will arrange in the [place] office.   For questions or updates, please contact [place] Please consult [email or web address] for contact info under        Signed, Reino Bellis, NP  04/22/2021, 9:48 AM    I have examined the patient and reviewed assessment and plan and discussed with patient.  Agree with above as stated.    Elevated coronary calcium score but no significant disease by CTA.  Continue aggressive risk factor  modification.  Increase intensity of statin.  He needs diabetes control and blood pressure control.  We spoke about continued exercise and healthy, plant-based diet.  Avoid processed foods.  Echocardiogram showed normal LV function.  No further cardiac testing needed at this time.  He can follow-up with cardiology annually at the [facility] office.  Larae Grooms

## 2021-04-22 NOTE — Discharge Instructions (Signed)
Jared Wood was admitted to the Hospital on 04/20/2021 and Discharged on Discharge Date 04/22/2021 and should be excused from work/school   for 2  days starting 04/20/2021 , may return to work/school without any restrictions.  Call Bess Harvest MD, [place] Hospitalist [phone] with questions.  [name] M.D on 04/22/2021,at 12:49 PM  [facility] Office  [phone]

## 2021-04-24 ENCOUNTER — Ambulatory Visit (HOSPITAL_BASED_OUTPATIENT_CLINIC_OR_DEPARTMENT_OTHER): Payer: Managed Care, Other (non HMO) | Admitting: Family

## 2021-04-24 ENCOUNTER — Other Ambulatory Visit (HOSPITAL_COMMUNITY): Payer: Self-pay

## 2021-04-24 NOTE — TOC Benefit Eligibility Note (Signed)
Patient Advocate Encounter  Received notification that the request for prior authorization for Vascepa 1 gm has been denied due to Drug Not Covered/Plan Exclusion.       Lyndel Safe, [place] Patient Advocate Specialist [place] Antimicrobial Stewardship Team Direct Number: [phone]  Fax: [phone]

## 2021-04-24 NOTE — Progress Notes (Deleted)
Office Visit    Patient Name: Jared Wood Date of Encounter: 04/24/2021  PCP:  Marco Collie, [place]  Cardiologist:  Larae Grooms, MD  Advanced Practice Provider:  No care team member to display Electrophysiologist:  None     Chief Complaint    Jared Wood is a 55 y.o. male with a hx of *** presents today for hospital follow up   Past Medical History    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   Depression    Diabetes mellitus without complication ([place])    Type 2   Essential hypertension 04/07/2015   GERD (gastroesophageal reflux disease)    Hard of hearing    Headache 06/30/2015   Heart disease    Hemiplegic migraine    History of bleeding ulcers    History of kidney stones    Hyperlipemia    Hypertension    Left-sided weakness    Migraine    Migraine with aura and without status migrainosus, not intractable 06/17/2019   Mixed hyperlipidemia 04/07/2015   Neuropathy    Pain in joint of left shoulder 07/24/2015   PONV (postoperative nausea and vomiting)    Nausea   Primary osteoarthritis of left knee 08/14/2015   S/P total knee replacement 12/01/2015   Skin cancer 2019   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/2015   Overview:  Most likely explanation for her symptoms at admission   Status post total right knee replacement 12/16/2015   Stroke ([place]) 04/2015   pt's wife said they were told by a doctor this may have been conversion disorder. was given TPA and underwent rehabilitation.   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 ([place])  04/07/2015   Past Surgical History:  Procedure Laterality Date   APPENDECTOMY  2002   CHOLECYSTECTOMY     2003 or 2004   COLONOSCOPY W/ ENDOSCOPIC [place]     CORONARY ANGIOPLASTY  2008   HAND SURGERY Left 2001   hand reconstruction   HERNIA REPAIR     LITHOTRIPSY     SHOULDER ARTHROSCOPY WITH OPEN ROTATOR CUFF REPAIR Right    TONSILLECTOMY  1972   TOTAL KNEE ARTHROPLASTY Right 12/01/2015   Procedure: TOTAL KNEE ARTHROPLASTY;  Surgeon: Vickey Huger, MD;  Location: [facility];  Service: Orthopedics;  Laterality: Right;    Allergies  Allergies  Allergen Reactions   Meperidine Anaphylaxis and Other (See Comments)    Confusion and behavioral changes (makes patient things negative things)     Latex Rash   Tape Rash and Other (See Comments)    Can stay on for ONLY a limited length of time    History of Present Illness    Jared Wood is a 55 y.o. male with a hx of *** last seen while hospitalized.   EKGs/Labs/Other Studies Reviewed:   The following studies were reviewed today: ***  EKG:  No EKG today  Recent Labs: 04/20/2021: ALT 35 04/21/2021: BUN 14; Creatinine, Ser 0.86; Hemoglobin 13.3; Platelets 243; Potassium 4.0; Sodium 139  Recent Lipid Panel    Component Value Date/Time   CHOL  118 04/21/2021 0455   TRIG 413 (H) 04/21/2021 0455   HDL 27 (L) 04/21/2021 0455   CHOLHDL 4.4 04/21/2021 0455   VLDL UNABLE TO CALCULATE IF TRIGLYCERIDE OVER 400 mg/dL 04/21/2021 0455   LDLCALC UNABLE TO CALCULATE IF TRIGLYCERIDE OVER 400 mg/dL 04/21/2021 0455   LDLDIRECT 51.6 04/21/2021 0455    Home Medications   No outpatient medications have been marked as taking for the 04/24/21 encounter (Appointment) with Loel Dubonnet, NP.     Review of Systems      All other systems reviewed and are otherwise negative except as noted above.  Physical Exam    VS:  There were no vitals taken for this visit. , BMI There is no height or weight on file to calculate BMI.  Wt Readings from Last 3  Encounters:  04/20/21 260 lb (117.9 kg)  02/19/21 253 lb (114.8 kg)  12/09/20 267 lb 3.2 oz (121.2 kg)     GEN: Well nourished, well developed, in no acute distress. HEENT: normal. Neck: Supple, no JVD, carotid bruits, or masses. Cardiac: ***RRR, no murmurs, rubs, or gallops. No clubbing, cyanosis, edema.  ***Radials/PT 2+ and equal bilaterally.  Respiratory:  ***Respirations regular and unlabored, clear to auscultation bilaterally. GI: Soft, nontender, nondistended. MS: No deformity or atrophy. Skin: Warm and dry, no rash. Neuro:  Strength and sensation are intact. Psych: Normal affect.  Assessment & Plan    ***  Disposition: Follow up {follow up:15908} with Dr. Irish Lack or APP.  Signed, Loel Dubonnet, NP 04/24/2021, 1:42 PM [facility]

## 2021-05-01 ENCOUNTER — Encounter: Payer: Managed Care, Other (non HMO) | Admitting: Physical Medicine & Rehabilitation

## 2021-05-11 ENCOUNTER — Telehealth: Payer: Self-pay | Admitting: *Deleted

## 2021-05-11 NOTE — Telephone Encounter (Signed)
On 04/30/21 pm I spoke with Jared Wood to verify he has been off plavix for [facility] procedure scheduled for 9/30. He reports that he has been off since Monday. This has not been the required 7 days. He was told 3 days and is very upset because of the difficulty getting off work and his wife getting off work to bring him. Apologies offered for the error in hold time. I discussed with Dr [name] and he cannot do the procedure if he has not held plavix for required 7 days. Dr [name] has an apt on Tuesday 10/4 that we will hold but the patient is doubtful he can get off to come at that time. Jared Wood practice manager  notified and will reschedule.

## 2021-05-15 ENCOUNTER — Encounter
Payer: Managed Care, Other (non HMO) | Attending: Physical Medicine & Rehabilitation | Admitting: Physical Medicine & Rehabilitation

## 2021-05-15 ENCOUNTER — Other Ambulatory Visit: Payer: Self-pay

## 2021-05-15 ENCOUNTER — Encounter: Payer: Self-pay | Admitting: Physical Medicine & Rehabilitation

## 2021-05-15 DIAGNOSIS — M5416 Radiculopathy, lumbar region: Secondary | ICD-10-CM | POA: Diagnosis not present

## 2021-05-15 NOTE — Progress Notes (Signed)
  PROCEDURE RECORD [facility]   Name: Janice Seales DOB:1965/08/10 MRN: [id number]  Date:05/15/2021  Physician: Alysia Penna, MD    Nurse/CMA: Jorja Loa MA  Allergies:  Allergies  Allergen Reactions   Meperidine Anaphylaxis and Other (See Comments)    Confusion and behavioral changes (makes patient things negative things)     Latex Rash   Tape Rash and Other (See Comments)    Can stay on for ONLY a limited length of time    Consent Signed: No.  Is patient diabetic? Yes.    CBG today? 123 on yesterday  Pregnant: No. LMP: No LMP for male patient. (age 55-55)  Anticoagulants: no, Plavix last taken last Thursday 05/08/2021 Anti-inflammatory: no Antibiotics: no  Procedure: Right L4,5 Epidural Steroid Injection  Position: Supine Start Time: 12:55 pm  End Time: 1:02pm  Fluoro Time: 37  RN/CMA Nahal Wanless MA Genni Buske MA    Time 12:34 PM 1:06 PM    BP 125/75 116/74    Pulse 71 67    Respirations 16 16    O2 Sat 96 98    S/S 6 6    Pain Level 4/10 2/10     D/C home with Wife, patient A & O X 3, D/C instructions reviewed, and sits independently.

## 2021-05-15 NOTE — Progress Notes (Signed)
Lumbar transforaminal epidural steroid injection under fluoroscopic guidance with contrast enhancement  Indication: Lumbosacral radiculitis is not relieved by medication management or other conservative care and interfering with self-care and mobility.   Informed consent was obtained after describing risk and benefits of the procedure with the patient, this includes bleeding, bruising, infection, paralysis and medication side effects.  The patient wishes to proceed and has given written consent.  Patient was placed in prone position.  The lumbar area was marked and prepped with Betadine.  It was entered with a 25-gauge 1-1/2 inch needle and one mL of 1% lidocaine was injected into the skin and subcutaneous tissue.  Then a 22-gauge 5" spinal needle was inserted into the RIght L4-5 intervertebral foramen under AP, lateral, and oblique view.  Once needle tip was within the foramen on lateral views an dnor exceeding 6 o clock position on th epedical on AP viewed Isovue 200 was inected x 48ml Then a solution containing one mL of 10 mg per mL dexamethasone and 2 mL of 1% lidocaine was injected.  The patient tolerated procedure well.  Post procedure instructions were given.  Please see post procedure form.

## 2021-05-15 NOTE — Patient Instructions (Addendum)
Please call if injection was not helpful and I can evaluate you in clinic    You received an epidural steroid injection under fluoroscopic guidance. This is the most accurate way to perform an epidural injection. This injection was performed to relieve thigh or leg or foot pain that may be related to a pinched nerve in the lumbar spine. The local anesthetic injected today may cause numbness in your leg for a couple hours. If it is severe we may need to observe you for 30-60 minutes after the injection. The cortisone medicine injected today may take several days to take full effect. This medicine can also cause facial flushing or feeling of being warm.  This injection may last for days weeks or months. It can be repeated if needed. If it is not effective, another spinal level may need to be injected. Other treatments include medication management as well as physical therapy. In some cases surgery may be an option.

## 2021-05-18 ENCOUNTER — Ambulatory Visit: Payer: Managed Care, Other (non HMO) | Admitting: Physical Medicine and Rehabilitation

## 2021-06-05 ENCOUNTER — Telehealth: Payer: Self-pay

## 2021-06-05 ENCOUNTER — Other Ambulatory Visit: Payer: Self-pay

## 2021-06-05 ENCOUNTER — Ambulatory Visit: Payer: Managed Care, Other (non HMO) | Admitting: Physical Medicine and Rehabilitation

## 2021-06-05 NOTE — Telephone Encounter (Signed)
Encounter error

## 2021-06-08 MED ORDER — GABAPENTIN 400 MG PO CAPS: 400.0000 mg | ORAL_CAPSULE | Freq: Three times a day (TID) | ORAL | 3 refills | Status: DC

## 2021-06-08 MED ORDER — MELOXICAM 7.5 MG PO TABS: 7.5000 mg | ORAL_TABLET | Freq: Every day | ORAL | 1 refills | Status: DC

## 2021-07-03 ENCOUNTER — Ambulatory Visit: Payer: Managed Care, Other (non HMO) | Admitting: Physical Medicine & Rehabilitation

## 2021-08-05 ENCOUNTER — Other Ambulatory Visit: Payer: Self-pay

## 2021-08-06 MED ORDER — MELOXICAM 7.5 MG PO TABS: 7.5000 mg | ORAL_TABLET | Freq: Every day | ORAL | 1 refills | Status: DC

## 2021-08-31 ENCOUNTER — Ambulatory Visit: Payer: Managed Care, Other (non HMO) | Admitting: Physical Medicine and Rehabilitation

## 2021-09-29 ENCOUNTER — Encounter: Payer: Self-pay | Admitting: Adult Health

## 2021-09-29 ENCOUNTER — Ambulatory Visit: Payer: Managed Care, Other (non HMO) | Admitting: Adult Health

## 2021-09-29 VITALS — BP 112/69 | HR 67 | Ht 69.0 in | Wt 260.0 lb

## 2021-09-29 DIAGNOSIS — G43711 Chronic migraine without aura, intractable, with status migrainosus: Secondary | ICD-10-CM | POA: Diagnosis not present

## 2021-09-29 NOTE — Patient Instructions (Signed)
Your Plan:  Continue Alovy  Continue Ubrevly for abortive therapy If your symptoms worsen or you develop new symptoms please let us know.       Thank you for coming to see [place] at [facility]. I hope we have been able to provide you high quality care today.  You may receive a patient satisfaction survey over the next few weeks. We would appreciate your feedback and comments so that we may continue to improve ourselves and the health of our patients.

## 2021-09-29 NOTE — Progress Notes (Signed)
PATIENT: Jared Wood DOB: 03/05/1966  REASON FOR VISIT: follow up HISTORY FROM: patient PRIMARY NEUROLOGIST: Dr. Rexene Alberts   HISTORY OF PRESENT ILLNESS: Today 09/29/21:  Mr. Secrist is a 56 year old male with a history of migraine headaches.  He returns today for follow-up.  He reports that his headaches have been under good control with Ajovy.  He has approximately 2-3 migraines a month.  Typically can take Aleve or Ubrelvy and it does help the headache.  He states that he still gets 2-3 dull headaches a week for most of the time he does not even notice them.  HISTORY (copied from Dr. Cathren Laine note) Doing great on Aimovig. But insurance isn;t paying for it. Giving 6 months of samples, 70mg  so 2 shots a month instead of 1 shot. Will apply for patient assistance and if that doesn;t work withh try [employer] [place] or [employer]. Aimovig tremendously helpful. No significant side effects (discussed the SE of aimovig), it wears off in 3 weeks, so try 70mg  every 2 weeks and we will try to get 70mg  every 2 week from patient assistance.   Interval history 09/24/2019: He has had an exceptional response to [facility].  His wife is here today and she says she has noticed a tremendous difference in them.  He has not had significant headaches, although he does still wake up with headaches and we are pending sleep evaluation with Dr. Brett Fairy to evaluate for sleep apnea which has been diagnosed with in the past and given his TIA I think this is important for him to follow through with.  I spoke with Sheena in our sleep team office, she was initially unable to get a release form for his past sleep records I did talk to her today and give her the release, I also provided the phone number for Shaner directly in case they do not hear from her (his voicemail was full last time we tried to call) and then they will follow-up.  At this point we will keep him on the Aimovig.  The sleep team will call.  We did schedule an EEG, he has been fine  recently, his loss of consciousness in the past was not consistent with seizure-like activity, at this point they like to hold off on the EEG but I did state that if this happens again they definitely need to have routine EEG possibly long-term EEG, he still cannot drive for 6 months after loss of consciousness.   HPI:  Gilles Trimpe is a 56 y.o. male here as requested by Marco Collie, MD for migraines.  I reviewed notes from Dr. Nyra Capes.  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  Patient had an episode of alteration of mentation, EMS took him to [facility] and he was diagnosed with complicated migraines. However he had non-organic neuro findings.    Started 04/07/2015 he has had these spells. He went to the emergency room, had tpa, left-sided weakness, he still feels it is weak. Ever since then he has headaches. Headaches in the morning, snoring, obesity, has been diagnosed with OSA in the past not on cpap, excessive daytime somnolence. Hasn't been tested since 2017 at [facility] in [place] neurology. Headache sin the back of the head. Daily. He has migraines, they start on the top of  the head, the whole head with presure, light and sound sensitivity, sound is worse, nausea, no vomiting, movement makes it worse, dark room makes it better, last 24 hours. He is taking Tylenol, ibuprofen, also for arthritis, bc powder. He was mowing his lawn a few weeks ago, he had a headache, worsening, he has lost 140 pounds, he was feeling worse, lightheaded thought it was his glucose dropping, wife provides much information, he was still symptomatic when wife got there 20 minutes later, but he was  unresponsive in the yard, laid out in the yard, eyes closed, no abnormal movements, was able to hear everyone talking and then went out again patient remembers "bits and pieces".    Reviewed notes, labs and imaging from outside physicians, which showed:   MRI brain showed No acute intracranial abnormalities including mass lesion or mass effect, hydrocephalus, extra-axial fluid collection, midline shift, hemorrhage, or acute infarction, large ischemic events (personally reviewed images)   CMP elevated glucose otherwise unremarkable  REVIEW OF SYSTEMS: Out of a complete 14 system review of symptoms, the patient complains only of the following symptoms, and all other reviewed systems are negative.  ALLERGIES: Allergies  Allergen Reactions   Meperidine Anaphylaxis and Other (See Comments)    Confusion and behavioral changes (makes patient things negative things)     Latex Rash   Tape Rash and Other (See Comments)    Can stay on for ONLY a limited length of time    HOME MEDICATIONS: Outpatient Medications Prior to Visit  Medication Sig Dispense Refill   ARIPiprazole (ABILIFY) 5 MG tablet Take 5 mg by mouth daily.     aspirin 325 MG EC tablet Take 1 tablet by mouth daily.     clopidogrel (PLAVIX) 75 MG tablet Take 75 mg by mouth daily.     dapagliflozin propanediol (FARXIGA) 10 MG TABS tablet Take 10 mg by mouth daily.     dicyclomine (BENTYL) 10 MG capsule Take 10 mg by mouth in the morning and at bedtime.     divalproex (DEPAKOTE ER) 500 MG 24 hr tablet Take 1 tablet by mouth daily.     escitalopram (LEXAPRO) 20 MG tablet Take 20 mg by mouth daily.     fenofibrate (TRICOR) 145 MG tablet Take 145 mg by mouth daily.     folic acid (FOLVITE) 914 MCG tablet Take 800 mcg by mouth daily with breakfast.      Fremanezumab-vfrm (AJOVY) 225 MG/1.5ML SOAJ Inject 1.5 mLs into the skin every 30 (thirty) days. 1.68 mL 11   gabapentin (NEURONTIN) 400 MG capsule Take 1 capsule (400 mg total) by mouth  3 (three) times daily. 90 capsule 3   levocetirizine (XYZAL) 5 MG tablet Take 5 mg by mouth at bedtime.     losartan (COZAAR) 25 MG tablet Take 25 mg by mouth daily.     losartan (COZAAR) 50 MG tablet Take by mouth.     meloxicam (MOBIC) 15 MG tablet Take 1 tablet by mouth daily.     meloxicam (MOBIC) 7.5 MG tablet Take 1 tablet (7.5 mg total) by mouth daily. 30 tablet 1   metFORMIN (GLUCOPHAGE) 1000 MG tablet Take 1,000 mg by mouth 2 (two) times daily with a meal.     methocarbamol (ROBAXIN) 500 MG tablet Take 1 tablet (500 mg total) by mouth every 8 (eight) hours as needed for muscle spasms. 90 tablet 2   Multiple Vitamins-Minerals (CENTRUM MEN) TABS Take 1 tablet by mouth daily with breakfast.  nitroGLYCERIN (NITROSTAT) 0.4 MG SL tablet Place 0.4 mg under the tongue every 5 (five) minutes as needed for chest pain.     omeprazole (PRILOSEC) 20 MG capsule Take 20 mg by mouth 2 (two) times daily before a meal.     rosuvastatin (CRESTOR) 40 MG tablet Take 40 mg by mouth daily.     Semaglutide, 1 MG/DOSE, (OZEMPIC, 1 MG/DOSE,) 4 MG/3ML SOPN Inject 1 mg into the skin once a week. On "Sunday     topiramate (TOPAMAX) 50 MG tablet Take 50 mg by mouth 2 (two) times daily.     Ubrogepant (UBRELVY) 100 MG TABS Take 100 mg by mouth every 2 (two) hours as needed. Maximum 200mg a day. 16 tablet 11   No facility-administered medications prior to visit.    PAST MEDICAL HISTORY: 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   Depression    Diabetes mellitus without complication (HCC)    Type 2   Essential  hypertension 04/07/2015   GERD (gastroesophageal reflux disease)    Hard of hearing    Headache 06/30/2015   Heart disease    Hemiplegic migraine    History of bleeding ulcers    History of kidney stones    Hyperlipemia    Hypertension    Left-sided weakness    Migraine    Migraine with aura and without status migrainosus, not intractable 06/17/2019   Mixed hyperlipidemia 04/07/2015   Neuropathy    Pain in joint of left shoulder 07/24/2015   PONV (postoperative nausea and vomiting)    Nausea   Primary osteoarthritis of left knee 08/14/2015   S/P total knee replacement 12/01/2015   Skin cancer 2019   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 was given TPA and underwent rehabilitation.   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 (HCC) 04/07/2015    PAST SURGICAL HISTORY: Past Surgical History:  Procedure Laterality Date   APPENDECTOMY  2002   CHOLECYSTECTOMY     20" 03 or 2004   COLONOSCOPY W/ ENDOSCOPIC [place]     CORONARY ANGIOPLASTY  2008   HAND SURGERY Left 2001   hand reconstruction   HERNIA REPAIR     LITHOTRIPSY     SHOULDER ARTHROSCOPY WITH OPEN ROTATOR CUFF REPAIR Right    TONSILLECTOMY  1972   TOTAL KNEE ARTHROPLASTY Right 12/01/2015   Procedure: TOTAL KNEE ARTHROPLASTY;  Surgeon: Vickey Huger, MD;  Location: [place];  Service: Orthopedics;  Laterality: Right;    FAMILY HISTORY: Family History  Problem Relation Age of Onset   Myocarditis Mother    Prostate cancer Father    Liver cancer Father    Kidney cancer Father    Other Father        lymph nodes   COPD Father    Emphysema Father    Congestive Heart Failure Father    High blood pressure Father  Arthritis Father    Migraines Neg Hx     SOCIAL HISTORY: Social History    Socioeconomic History   Marital status: Married    Spouse name: Not on file   Number of children: 3   Years of education: 2 yrs college   Highest education level: Not on file  Occupational History   Not on file  Tobacco Use   Smoking status: Former   Smokeless tobacco: Current    Types: Chew   Tobacco comments:    uses 1-2 cans/day  Vaping Use   Vaping Use: Former  Substance and Sexual Activity   Alcohol use: Yes    Comment: occasional   Drug use: No    Comment: quit hard drugs in 1990   Sexual activity: Not on file  Other Topics Concern   Not on file  Social History Narrative   Lives at home with spouse   Caffeine: "not as much as I used to", can drink up to 2-3 cups/day   Social Determinants of Health   Financial Resource Strain: Not on file  Food Insecurity: Not on file  Transportation Needs: Not on file  Physical Activity: Not on file  Stress: Not on file  Social Connections: Not on file  Intimate Partner Violence: Not on file      PHYSICAL EXAM  Vitals:   09/29/21 0740  BP: 112/69  Pulse: 67  Weight: 260 lb (117.9 kg)  Height: 5\' 9"  (1.753 m)   Body mass index is 38.4 kg/m.  Generalized: Well developed, in no acute distress   Neurological examination  Mentation: Alert oriented to time, place, history taking. Follows all commands speech and language fluent Cranial nerve II-XII: Pupils were equal round reactive to light. Extraocular movements were full, visual field were full on confrontational test. Facial sensation and strength were normal. Head turning and shoulder shrug  were normal and symmetric. Motor: The motor testing reveals 5 over 5 strength of all 4 extremities. Good symmetric motor tone is noted throughout.  Sensory: Sensory testing is intact to soft touch on all 4 extremities. No evidence of extinction is noted.  Coordination: Cerebellar testing reveals good finger-nose-finger and heel-to-shin bilaterally.  Gait and station: Gait is  normal.    DIAGNOSTIC DATA (LABS, IMAGING, TESTING) - I reviewed patient records, labs, notes, testing and imaging myself where available.  Lab Results  Component Value Date   WBC 5.9 04/21/2021   HGB 13.3 04/21/2021   HCT 41.1 04/21/2021   MCV 86.2 04/21/2021   PLT 243 04/21/2021      Component Value Date/Time   NA 139 04/21/2021 0455   K 4.0 04/21/2021 0455   CL 108 04/21/2021 0455   CO2 23 04/21/2021 0455   GLUCOSE 226 (H) 04/21/2021 0455   BUN 14 04/21/2021 0455   CREATININE 0.86 04/21/2021 0455   CALCIUM 9.4 04/21/2021 0455   PROT 6.0 (L) 04/20/2021 1728   ALBUMIN 3.5 04/20/2021 1728   AST 27 04/20/2021 1728   ALT 35 04/20/2021 1728   ALKPHOS 45 04/20/2021 1728   BILITOT 0.5 04/20/2021 1728   GFRNONAA >60 04/21/2021 0455   GFRAA >60 04/03/2019 1838   Lab Results  Component Value Date   CHOL 118 04/21/2021   HDL 27 (L) 04/21/2021   LDLCALC UNABLE TO CALCULATE IF TRIGLYCERIDE OVER 400 mg/dL 04/21/2021   LDLDIRECT 51.6 04/21/2021   TRIG 413 (H) 04/21/2021   CHOLHDL 4.4 04/21/2021   Lab Results  Component Value Date   HGBA1C  7.6 (H) 04/21/2021      ASSESSMENT AND PLAN 56 y.o. year old male  has a past medical history of Alteration of awareness (06/17/2019), Anxiety, Arthritis, Chronic migraine without aura, with intractable migraine, so stated, with status migrainosus (06/17/2019), Chronic pain of both knees (07/24/2015), Conversion disorder, Conversion disorder with abnormal movement, acute episode, without psychological stressor (04/12/2015), Conversion disorder with mixed symptom presentation (06/30/2015), Depression, Diabetes mellitus without complication ([place]), Essential hypertension (04/07/2015), GERD (gastroesophageal reflux disease), Hard of hearing, Headache (06/30/2015), Heart disease, Hemiplegic migraine, History of bleeding ulcers, History of kidney stones, Hyperlipemia, Hypertension, Left-sided weakness, Migraine, Migraine with aura and without status  migrainosus, not intractable (06/17/2019), Mixed hyperlipidemia (04/07/2015), Neuropathy, Pain in joint of left shoulder (07/24/2015), PONV (postoperative nausea and vomiting), Primary osteoarthritis of left knee (08/14/2015), S/P total knee replacement (12/01/2015), Skin cancer (2019), Somatization disorder (04/08/2015), Status post total right knee replacement (12/16/2015), Stroke ([place]) (04/2015), and 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 here with:  1.  Migraine headaches  Continue Ajovy monthly injection Continue Ubrelvy as an abortive therapy Follow-up in 1 year or sooner if needed     Ward Givens, MSN, NP-C 09/29/2021, 8:10 AM [facility] [address], [place], [place] [address] [phone]

## 2021-10-06 ENCOUNTER — Other Ambulatory Visit: Payer: Self-pay

## 2021-10-06 MED ORDER — GABAPENTIN 400 MG PO CAPS: 400.0000 mg | ORAL_CAPSULE | Freq: Three times a day (TID) | ORAL | 3 refills | Status: DC

## 2021-10-06 MED ORDER — MELOXICAM 7.5 MG PO TABS: 7.5000 mg | ORAL_TABLET | Freq: Every day | ORAL | 1 refills | Status: DC

## 2021-10-14 NOTE — Progress Notes (Deleted)
? ?Subjective:  ? ? Patient ID: Jared Wood, male    DOB: 1965-08-03, 56 y.o.   MRN: [id number] ? ?HPI ?Male with pmh/psh of DM, stroke vs conversion disorder with left sided weakness, right TKA, right rotator cuff repair, left hand reconstruction, neuropathy, migraines, HTN, headaches, GERD, depression, conversion disorder, anxiety, OA presents with back pain.  Started ~06/2020, progressive getting worse since ~09/2020.  Denies inciting event.  Aleve helps. Standing for prolonged periods, bending, stooping, stairs (going up worse) exacerbates the pain. Sharp, stabbing.  Non-radiating.  Intermittent.  Tylenol and Ibu with minimal benefit.  Denies associated weakness.  Denies falls. Pain limits ambulation. Patient is a long distance truck driver.  ? ?Left sided weakness: stable over years ? ?Pain is worst in lower back bilaterally. It is present most of the time. Pain radiates down back sides of the legs into the calves. Stopping and walking around helps with the pain in his calves.  ? ? ? ?Pain Inventory ?Average Pain 3 ?Pain Right Now 3 ?My pain is intermittent, sharp, stabbing, and tingling ? ?In the last 24 hours, has pain interfered with the following? ?General activity 2 ?Relation with others 0 ?Enjoyment of life 4 ?What TIME of day is your pain at its worst? evening ?Sleep (in general) Fair ? ?Pain is worse with: walking, bending, sitting, inactivity, standing, and some activites ?Pain improves with: rest, heat/ice, and medication ?Relief from Meds: 3 ? ?walk without assistance ?walk with assistance ?use a cane ?how many minutes can you walk? varies ? ?employed # of hrs/week 70hours ?what is your job? truck driver ? ?weakness ?numbness ?tingling ?trouble walking ? ?Any changes since last visit?  no ? ?Any changes since last visit?  no ? ? ? ?Family History  ?Problem Relation Age of Onset  ?? Myocarditis Mother   ?? Prostate cancer Father   ?? Liver cancer Father   ?? Kidney cancer Father   ?? Other Father   ?      lymph nodes  ?? COPD Father   ?? Emphysema Father   ?? Congestive Heart Failure Father   ?? High blood pressure Father   ?? Arthritis Father   ?? Migraines Neg Hx   ? ?Social History  ? ?Socioeconomic History  ?? Marital status: Married  ?  Spouse name: Not on file  ?? Number of children: 3  ?? Years of education: 2 yrs college  ?? Highest education level: Not on file  ?Occupational History  ?? Not on file  ?Tobacco Use  ?? Smoking status: Former  ?? Smokeless tobacco: Current  ?  Types: Chew  ?? Tobacco comments:  ?  uses 1-2 cans/day  ?Vaping Use  ?? Vaping Use: Former  ?Substance and Sexual Activity  ?? Alcohol use: Yes  ?  Comment: occasional  ?? Drug use: No  ?  Comment: quit hard drugs in 1990  ?? Sexual activity: Not on file  ?Other Topics Concern  ?? Not on file  ?Social History Narrative  ? Lives at home with spouse  ? Caffeine: "not as much as I used to", can drink up to 2-3 cups/day  ? ?Social Determinants of Health  ? ?Financial Resource Strain: Not on file  ?Food Insecurity: Not on file  ?Transportation Needs: Not on file  ?Physical Activity: Not on file  ?Stress: Not on file  ?Social Connections: Not on file  ? ?Past Surgical History:  ?Procedure Laterality Date  ?? APPENDECTOMY  2002  ?? CHOLECYSTECTOMY    ?  2003 or 2004  ?? COLONOSCOPY W/ ENDOSCOPIC [place]    ?? CORONARY ANGIOPLASTY  2008  ?? HAND SURGERY Left 2001  ? hand reconstruction  ?? HERNIA REPAIR    ?? LITHOTRIPSY    ?? SHOULDER ARTHROSCOPY WITH OPEN ROTATOR CUFF REPAIR Right   ?? TONSILLECTOMY  1972  ?? TOTAL KNEE ARTHROPLASTY Right 12/01/2015  ? Procedure: TOTAL KNEE ARTHROPLASTY;  Surgeon: Vickey Huger, MD;  Location: [facility];  Service: Orthopedics;  Laterality: Right;  ? ?Past Medical History:  ?Diagnosis Date  ?? Alteration of awareness 06/17/2019  ?? Anxiety   ?? Arthritis   ?? Chronic migraine without aura, with intractable migraine, so stated, with status migrainosus 06/17/2019  ?? Chronic pain of both knees 07/24/2015  ?? Conversion disorder    ?? Conversion disorder with abnormal movement, acute episode, without psychological stressor 04/12/2015  ?? 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  ?? Depression   ?? Diabetes mellitus without complication ([place])   ? Type 2  ?? Essential hypertension 04/07/2015  ?? GERD (gastroesophageal reflux disease)   ?? Hard of hearing   ?? Headache 06/30/2015  ?? Heart disease   ?? Hemiplegic migraine   ?? History of bleeding ulcers   ?? History of kidney stones   ?? Hyperlipemia   ?? Hypertension   ?? Left-sided weakness   ?? Migraine   ?? Migraine with aura and without status migrainosus, not intractable 06/17/2019  ?? Mixed hyperlipidemia 04/07/2015  ?? Neuropathy   ?? Pain in joint of left shoulder 07/24/2015  ?? PONV (postoperative nausea and vomiting)   ? Nausea  ?? Primary osteoarthritis of left knee 08/14/2015  ?? S/P total knee replacement 12/01/2015  ?? Skin cancer 2019  ?? 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/2015  ? Overview:  Most likely explanation for her symptoms at admission  ?? Status post total right knee replacement 12/16/2015  ?? Stroke [facility]) 04/2015  ? pt's wife said they were told by a doctor this may have been conversion disorder. was given TPA and underwent rehabilitation.  ?? 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 ?There were no vitals taken for this visit. ? ?Opioid Risk Score:   ?Fall Risk Score:  `1 ? ?Depression screen PHQ 2/9 ? ?Depression screen [facility] 2/9 05/15/2021 12/09/2020  ?Decreased Interest 0 0  ?Down, Depressed, Hopeless 0 0  ?PHQ - 2 Score 0 0  ?Altered sleeping - 1  ?Tired, decreased energy - 0  ?Change in appetite - 0  ?Feeling bad or failure about yourself  - 0  ?Trouble concentrating - 0  ?Moving slowly or fidgety/restless - 0  ?Suicidal thoughts - 0  ?PHQ-9 Score - 1  ?Difficult doing work/chores - Not difficult at all  ? ? ?Review of Systems  ?Respiratory:  Positive for apnea.   ?Gastrointestinal:  Positive for nausea.   ?Musculoskeletal:  Positive for arthralgias, back pain, gait problem and neck pain.  ?Neurological:  Positive for weakness and numbness.  ?     Tingling  ?All other systems reviewed and are negative. ? ? ?   ?Objective:  ? Physical Exam ?Gen: no distress, normal appearing ?HEENT: oral mucosa pink and moist, NCAT ?Cardio: Reg rate ?Chest: normal effort, normal rate of breathing ?Abd: soft, non-distended ?Ext: no edema ?Psych: pleasant, normal affect ?Skin: intact ?Musc: No edema in extremities.  No tenderness in extremities. ?+Axial grind on left ?Neg stork's b/l ?Neg fortin finger ?+TTP right >> left lumbosacral PSPs ?Gait:  Heel/toe limited due to pain ?Neuro: Alert ?Motor: RLE: 5/5 proximal to distal ?LLE: 4/5 proximal to distal ?Sensation intact to light touch ?Reflexes 2+ in right ankle, left knee and ankle ?Neg SLR b/l ?   ?Assessment & Plan:  ?Male with pmh/psh of DM, stroke vs conversion disorder with left sided weakness, right TKA, right rotator cuff repair, left hand reconstruction, neuropathy, migraines, HTN, headaches, GERD, depression, conversion disorder, anxiety, OA presents with back pain.   ? ?1. Chronic mechanical low back pain ? Pt states MRI L-spine ~09/2020, unavailable, per patient arthritis ? Labs reviewed ? Chart/Referral information reviewed - back pain ? PMAWARE reviewed ? Continue Heat ? Will refer to PT with trial of TENS- has not found a great difference.  ? Continue Gabapentin per PCP ? Will order Robaxin 500 TID PRN ? Will order Mobic 7.5 daily with food, instructed to d/c Ibu and Aleve and Nabumatone.  Educated on signs/symptoms of GI bleeding ? Patient states main goal is ambulate ? Continue Tylenol, no more than 4,'000mg'$  per day.  ?  ?2. Gait abnormality ? Cont cane for safety ? PT ordered ? ?3. Sleep disturbance ? Increase gabapentin '400mg'$  TID.  ? ?4. Morbid Obesity ? Will consider referral to dietitian ? Has lost 160 lbs.  ? Continue intermittent fasting ? Recommended 1 glass water  with 1 TB apple cider vinegar before meals to reduce CBG spike, has additional health benefits, drink with straw to protect enamel.   ? ?5. Myalgia  ? Will consider trigger point injections ?

## 2021-10-15 ENCOUNTER — Encounter
Payer: Managed Care, Other (non HMO) | Attending: Physical Medicine and Rehabilitation | Admitting: Physical Medicine and Rehabilitation

## 2021-12-09 ENCOUNTER — Other Ambulatory Visit: Payer: Self-pay | Admitting: *Deleted

## 2021-12-09 MED ORDER — MELOXICAM 7.5 MG PO TABS: 7.5000 mg | ORAL_TABLET | Freq: Every day | ORAL | 1 refills | Status: DC

## 2022-01-26 ENCOUNTER — Telehealth: Payer: Self-pay | Admitting: *Deleted

## 2022-01-27 NOTE — Telephone Encounter (Addendum)
Approval letter faxed to [facility]. Received a receipt of confirmation.

## 2022-01-27 NOTE — Telephone Encounter (Signed)
Received [facility] CM approval Ajovy  01-26-2022 thru 01-27-2023.  ID [id number],  PA# [id number] AJ.

## 2022-02-09 ENCOUNTER — Other Ambulatory Visit: Payer: Self-pay

## 2022-02-09 MED ORDER — MELOXICAM 7.5 MG PO TABS: 7.5000 mg | ORAL_TABLET | Freq: Every day | ORAL | 1 refills | Status: DC

## 2022-02-18 DIAGNOSIS — J019 Acute sinusitis, unspecified: Secondary | ICD-10-CM | POA: Diagnosis not present

## 2022-02-18 DIAGNOSIS — Z20822 Contact with and (suspected) exposure to covid-19: Secondary | ICD-10-CM | POA: Diagnosis not present

## 2022-02-18 DIAGNOSIS — R0981 Nasal congestion: Secondary | ICD-10-CM | POA: Diagnosis not present

## 2022-02-24 ENCOUNTER — Telehealth: Payer: Self-pay | Admitting: *Deleted

## 2022-02-24 NOTE — Telephone Encounter (Signed)
[facility] Key: [id number] - Rx #: [id number]   PA Ajovy complete Waiting on approval

## 2022-03-05 DIAGNOSIS — Z6838 Body mass index (BMI) 38.0-38.9, adult: Secondary | ICD-10-CM | POA: Diagnosis not present

## 2022-03-05 DIAGNOSIS — K1379 Other lesions of oral mucosa: Secondary | ICD-10-CM | POA: Diagnosis not present

## 2022-03-11 NOTE — Telephone Encounter (Signed)
Approved on July 28 Effective from 02/24/2022 through 05/18/2022.

## 2022-04-12 DIAGNOSIS — E1169 Type 2 diabetes mellitus with other specified complication: Secondary | ICD-10-CM | POA: Diagnosis not present

## 2022-04-19 DIAGNOSIS — M549 Dorsalgia, unspecified: Secondary | ICD-10-CM | POA: Diagnosis not present

## 2022-04-19 DIAGNOSIS — I10 Essential (primary) hypertension: Secondary | ICD-10-CM | POA: Diagnosis not present

## 2022-04-19 DIAGNOSIS — Z23 Encounter for immunization: Secondary | ICD-10-CM | POA: Diagnosis not present

## 2022-04-19 DIAGNOSIS — Z6839 Body mass index (BMI) 39.0-39.9, adult: Secondary | ICD-10-CM | POA: Diagnosis not present

## 2022-04-19 DIAGNOSIS — E782 Mixed hyperlipidemia: Secondary | ICD-10-CM | POA: Diagnosis not present

## 2022-04-19 DIAGNOSIS — E1169 Type 2 diabetes mellitus with other specified complication: Secondary | ICD-10-CM | POA: Diagnosis not present

## 2022-04-21 ENCOUNTER — Other Ambulatory Visit: Payer: Self-pay | Admitting: Neurology

## 2022-04-21 DIAGNOSIS — G43711 Chronic migraine without aura, intractable, with status migrainosus: Secondary | ICD-10-CM

## 2022-04-22 ENCOUNTER — Other Ambulatory Visit: Payer: Self-pay

## 2022-04-22 MED ORDER — MELOXICAM 7.5 MG PO TABS: 7.5000 mg | ORAL_TABLET | Freq: Every day | ORAL | 1 refills | Status: DC

## 2022-04-22 NOTE — Telephone Encounter (Signed)
Pharmacy is sending a refill for Meloxicam 7.5 mg

## 2022-05-02 DIAGNOSIS — I219 Acute myocardial infarction, unspecified: Secondary | ICD-10-CM

## 2022-05-02 HISTORY — DX: Acute myocardial infarction, unspecified: I21.9

## 2022-05-02 HISTORY — PX: CORONARY ANGIOPLASTY WITH STENT PLACEMENT: SHX49

## 2022-05-04 ENCOUNTER — Encounter: Payer: Self-pay | Admitting: Gastroenterology

## 2022-05-23 DIAGNOSIS — I2510 Atherosclerotic heart disease of native coronary artery without angina pectoris: Secondary | ICD-10-CM | POA: Diagnosis not present

## 2022-05-23 DIAGNOSIS — R9431 Abnormal electrocardiogram [ECG] [EKG]: Secondary | ICD-10-CM | POA: Diagnosis not present

## 2022-05-23 DIAGNOSIS — R0602 Shortness of breath: Secondary | ICD-10-CM | POA: Diagnosis not present

## 2022-05-23 DIAGNOSIS — Z7902 Long term (current) use of antithrombotics/antiplatelets: Secondary | ICD-10-CM | POA: Diagnosis not present

## 2022-05-23 DIAGNOSIS — Z7982 Long term (current) use of aspirin: Secondary | ICD-10-CM | POA: Diagnosis not present

## 2022-05-23 DIAGNOSIS — Z91040 Latex allergy status: Secondary | ICD-10-CM | POA: Diagnosis not present

## 2022-05-23 DIAGNOSIS — E1140 Type 2 diabetes mellitus with diabetic neuropathy, unspecified: Secondary | ICD-10-CM | POA: Diagnosis not present

## 2022-05-23 DIAGNOSIS — Z96651 Presence of right artificial knee joint: Secondary | ICD-10-CM | POA: Diagnosis not present

## 2022-05-23 DIAGNOSIS — E119 Type 2 diabetes mellitus without complications: Secondary | ICD-10-CM | POA: Diagnosis not present

## 2022-05-23 DIAGNOSIS — I10 Essential (primary) hypertension: Secondary | ICD-10-CM | POA: Diagnosis not present

## 2022-05-23 DIAGNOSIS — I214 Non-ST elevation (NSTEMI) myocardial infarction: Secondary | ICD-10-CM | POA: Diagnosis not present

## 2022-05-23 DIAGNOSIS — I69354 Hemiplegia and hemiparesis following cerebral infarction affecting left non-dominant side: Secondary | ICD-10-CM | POA: Diagnosis not present

## 2022-05-23 DIAGNOSIS — I2584 Coronary atherosclerosis due to calcified coronary lesion: Secondary | ICD-10-CM | POA: Diagnosis not present

## 2022-05-23 DIAGNOSIS — I959 Hypotension, unspecified: Secondary | ICD-10-CM | POA: Diagnosis not present

## 2022-05-23 DIAGNOSIS — Z9049 Acquired absence of other specified parts of digestive tract: Secondary | ICD-10-CM | POA: Diagnosis not present

## 2022-05-23 DIAGNOSIS — R072 Precordial pain: Secondary | ICD-10-CM | POA: Diagnosis not present

## 2022-05-23 DIAGNOSIS — E785 Hyperlipidemia, unspecified: Secondary | ICD-10-CM | POA: Diagnosis not present

## 2022-05-23 DIAGNOSIS — I249 Acute ischemic heart disease, unspecified: Secondary | ICD-10-CM | POA: Diagnosis not present

## 2022-05-23 DIAGNOSIS — Z9861 Coronary angioplasty status: Secondary | ICD-10-CM | POA: Diagnosis not present

## 2022-05-23 DIAGNOSIS — K219 Gastro-esophageal reflux disease without esophagitis: Secondary | ICD-10-CM | POA: Diagnosis not present

## 2022-05-23 DIAGNOSIS — Z79899 Other long term (current) drug therapy: Secondary | ICD-10-CM | POA: Diagnosis not present

## 2022-05-23 DIAGNOSIS — R0609 Other forms of dyspnea: Secondary | ICD-10-CM | POA: Diagnosis not present

## 2022-05-23 DIAGNOSIS — R079 Chest pain, unspecified: Secondary | ICD-10-CM | POA: Diagnosis not present

## 2022-05-23 DIAGNOSIS — R000 Tachycardia, unspecified: Secondary | ICD-10-CM | POA: Diagnosis not present

## 2022-05-23 DIAGNOSIS — E1165 Type 2 diabetes mellitus with hyperglycemia: Secondary | ICD-10-CM | POA: Diagnosis not present

## 2022-05-23 DIAGNOSIS — G4733 Obstructive sleep apnea (adult) (pediatric): Secondary | ICD-10-CM | POA: Diagnosis not present

## 2022-05-23 DIAGNOSIS — I209 Angina pectoris, unspecified: Secondary | ICD-10-CM | POA: Diagnosis not present

## 2022-05-23 DIAGNOSIS — Z955 Presence of coronary angioplasty implant and graft: Secondary | ICD-10-CM | POA: Diagnosis not present

## 2022-05-25 DIAGNOSIS — Z955 Presence of coronary angioplasty implant and graft: Secondary | ICD-10-CM | POA: Insufficient documentation

## 2022-05-25 NOTE — Telephone Encounter (Signed)
Renewal PA completed on Cover My Meds. Key: [id number]. Awaiting determination from [place].

## 2022-05-26 NOTE — Telephone Encounter (Signed)
Approved on October 24 Effective from 05/25/2022 through 05/24/2023.

## 2022-05-27 DIAGNOSIS — R222 Localized swelling, mass and lump, trunk: Secondary | ICD-10-CM | POA: Diagnosis not present

## 2022-05-27 DIAGNOSIS — Z7689 Persons encountering health services in other specified circumstances: Secondary | ICD-10-CM | POA: Diagnosis not present

## 2022-05-27 DIAGNOSIS — R0609 Other forms of dyspnea: Secondary | ICD-10-CM | POA: Diagnosis not present

## 2022-05-27 DIAGNOSIS — Z6841 Body Mass Index (BMI) 40.0 and over, adult: Secondary | ICD-10-CM | POA: Diagnosis not present

## 2022-05-27 DIAGNOSIS — I214 Non-ST elevation (NSTEMI) myocardial infarction: Secondary | ICD-10-CM | POA: Diagnosis not present

## 2022-05-28 ENCOUNTER — Ambulatory Visit
Admission: RE | Admit: 2022-05-28 | Discharge: 2022-05-28 | Disposition: A | Discharge status: Home / Self Care | Payer: BC Managed Care – PPO | Source: Ambulatory Visit | Attending: Family Medicine | Admitting: Family Medicine

## 2022-05-28 ENCOUNTER — Other Ambulatory Visit: Payer: Self-pay | Admitting: Family Medicine

## 2022-05-28 DIAGNOSIS — R222 Localized swelling, mass and lump, trunk: Secondary | ICD-10-CM

## 2022-05-28 DIAGNOSIS — J90 Pleural effusion, not elsewhere classified: Secondary | ICD-10-CM | POA: Diagnosis not present

## 2022-05-28 DIAGNOSIS — J9811 Atelectasis: Secondary | ICD-10-CM | POA: Diagnosis not present

## 2022-05-28 DIAGNOSIS — I3139 Other pericardial effusion (noninflammatory): Secondary | ICD-10-CM | POA: Diagnosis not present

## 2022-06-06 DIAGNOSIS — I2510 Atherosclerotic heart disease of native coronary artery without angina pectoris: Secondary | ICD-10-CM | POA: Insufficient documentation

## 2022-06-07 DIAGNOSIS — I10 Essential (primary) hypertension: Secondary | ICD-10-CM | POA: Diagnosis not present

## 2022-06-07 DIAGNOSIS — R002 Palpitations: Secondary | ICD-10-CM | POA: Diagnosis not present

## 2022-06-07 DIAGNOSIS — Z955 Presence of coronary angioplasty implant and graft: Secondary | ICD-10-CM | POA: Diagnosis not present

## 2022-06-07 DIAGNOSIS — I2510 Atherosclerotic heart disease of native coronary artery without angina pectoris: Secondary | ICD-10-CM | POA: Diagnosis not present

## 2022-06-07 DIAGNOSIS — Z7689 Persons encountering health services in other specified circumstances: Secondary | ICD-10-CM | POA: Diagnosis not present

## 2022-06-07 DIAGNOSIS — I214 Non-ST elevation (NSTEMI) myocardial infarction: Secondary | ICD-10-CM | POA: Diagnosis not present

## 2022-06-10 DIAGNOSIS — I214 Non-ST elevation (NSTEMI) myocardial infarction: Secondary | ICD-10-CM | POA: Diagnosis not present

## 2022-06-10 DIAGNOSIS — I10 Essential (primary) hypertension: Secondary | ICD-10-CM | POA: Diagnosis not present

## 2022-06-10 DIAGNOSIS — R400 Somnolence: Secondary | ICD-10-CM | POA: Diagnosis not present

## 2022-06-10 DIAGNOSIS — G4733 Obstructive sleep apnea (adult) (pediatric): Secondary | ICD-10-CM | POA: Diagnosis not present

## 2022-06-14 DIAGNOSIS — Z79899 Other long term (current) drug therapy: Secondary | ICD-10-CM | POA: Diagnosis not present

## 2022-06-14 DIAGNOSIS — Z5181 Encounter for therapeutic drug level monitoring: Secondary | ICD-10-CM | POA: Diagnosis not present

## 2022-06-16 DIAGNOSIS — I10 Essential (primary) hypertension: Secondary | ICD-10-CM | POA: Diagnosis not present

## 2022-06-16 DIAGNOSIS — E785 Hyperlipidemia, unspecified: Secondary | ICD-10-CM | POA: Diagnosis not present

## 2022-06-16 DIAGNOSIS — Z7982 Long term (current) use of aspirin: Secondary | ICD-10-CM | POA: Diagnosis not present

## 2022-06-16 DIAGNOSIS — E119 Type 2 diabetes mellitus without complications: Secondary | ICD-10-CM | POA: Diagnosis not present

## 2022-06-16 DIAGNOSIS — Z7902 Long term (current) use of antithrombotics/antiplatelets: Secondary | ICD-10-CM | POA: Diagnosis not present

## 2022-06-16 DIAGNOSIS — R0602 Shortness of breath: Secondary | ICD-10-CM | POA: Diagnosis not present

## 2022-06-16 DIAGNOSIS — Z79899 Other long term (current) drug therapy: Secondary | ICD-10-CM | POA: Diagnosis not present

## 2022-06-16 DIAGNOSIS — Z8673 Personal history of transient ischemic attack (TIA), and cerebral infarction without residual deficits: Secondary | ICD-10-CM | POA: Diagnosis not present

## 2022-06-16 DIAGNOSIS — Z955 Presence of coronary angioplasty implant and graft: Secondary | ICD-10-CM | POA: Diagnosis not present

## 2022-06-16 DIAGNOSIS — G4733 Obstructive sleep apnea (adult) (pediatric): Secondary | ICD-10-CM | POA: Diagnosis not present

## 2022-06-16 DIAGNOSIS — I252 Old myocardial infarction: Secondary | ICD-10-CM | POA: Diagnosis not present

## 2022-06-18 DIAGNOSIS — E119 Type 2 diabetes mellitus without complications: Secondary | ICD-10-CM | POA: Diagnosis not present

## 2022-06-18 DIAGNOSIS — I252 Old myocardial infarction: Secondary | ICD-10-CM | POA: Diagnosis not present

## 2022-06-18 DIAGNOSIS — Z7982 Long term (current) use of aspirin: Secondary | ICD-10-CM | POA: Diagnosis not present

## 2022-06-18 DIAGNOSIS — J90 Pleural effusion, not elsewhere classified: Secondary | ICD-10-CM | POA: Diagnosis not present

## 2022-06-18 DIAGNOSIS — I10 Essential (primary) hypertension: Secondary | ICD-10-CM | POA: Diagnosis not present

## 2022-06-18 DIAGNOSIS — Z8673 Personal history of transient ischemic attack (TIA), and cerebral infarction without residual deficits: Secondary | ICD-10-CM | POA: Diagnosis not present

## 2022-06-18 DIAGNOSIS — Z7902 Long term (current) use of antithrombotics/antiplatelets: Secondary | ICD-10-CM | POA: Diagnosis not present

## 2022-06-18 DIAGNOSIS — Z79899 Other long term (current) drug therapy: Secondary | ICD-10-CM | POA: Diagnosis not present

## 2022-06-18 DIAGNOSIS — E785 Hyperlipidemia, unspecified: Secondary | ICD-10-CM | POA: Diagnosis not present

## 2022-06-18 DIAGNOSIS — R0602 Shortness of breath: Secondary | ICD-10-CM | POA: Diagnosis not present

## 2022-06-18 DIAGNOSIS — Z955 Presence of coronary angioplasty implant and graft: Secondary | ICD-10-CM | POA: Diagnosis not present

## 2022-06-18 DIAGNOSIS — G4733 Obstructive sleep apnea (adult) (pediatric): Secondary | ICD-10-CM | POA: Diagnosis not present

## 2022-06-21 DIAGNOSIS — Z7902 Long term (current) use of antithrombotics/antiplatelets: Secondary | ICD-10-CM | POA: Diagnosis not present

## 2022-06-21 DIAGNOSIS — Z955 Presence of coronary angioplasty implant and graft: Secondary | ICD-10-CM | POA: Diagnosis not present

## 2022-06-21 DIAGNOSIS — E785 Hyperlipidemia, unspecified: Secondary | ICD-10-CM | POA: Diagnosis not present

## 2022-06-21 DIAGNOSIS — I252 Old myocardial infarction: Secondary | ICD-10-CM | POA: Diagnosis not present

## 2022-06-21 DIAGNOSIS — Z7982 Long term (current) use of aspirin: Secondary | ICD-10-CM | POA: Diagnosis not present

## 2022-06-21 DIAGNOSIS — G4733 Obstructive sleep apnea (adult) (pediatric): Secondary | ICD-10-CM | POA: Diagnosis not present

## 2022-06-21 DIAGNOSIS — E119 Type 2 diabetes mellitus without complications: Secondary | ICD-10-CM | POA: Diagnosis not present

## 2022-06-21 DIAGNOSIS — Z8673 Personal history of transient ischemic attack (TIA), and cerebral infarction without residual deficits: Secondary | ICD-10-CM | POA: Diagnosis not present

## 2022-06-21 DIAGNOSIS — I10 Essential (primary) hypertension: Secondary | ICD-10-CM | POA: Diagnosis not present

## 2022-06-21 DIAGNOSIS — Z79899 Other long term (current) drug therapy: Secondary | ICD-10-CM | POA: Diagnosis not present

## 2022-06-21 DIAGNOSIS — R0602 Shortness of breath: Secondary | ICD-10-CM | POA: Diagnosis not present

## 2022-06-23 DIAGNOSIS — E785 Hyperlipidemia, unspecified: Secondary | ICD-10-CM | POA: Diagnosis not present

## 2022-06-23 DIAGNOSIS — Z7902 Long term (current) use of antithrombotics/antiplatelets: Secondary | ICD-10-CM | POA: Diagnosis not present

## 2022-06-23 DIAGNOSIS — Z955 Presence of coronary angioplasty implant and graft: Secondary | ICD-10-CM | POA: Diagnosis not present

## 2022-06-23 DIAGNOSIS — E119 Type 2 diabetes mellitus without complications: Secondary | ICD-10-CM | POA: Diagnosis not present

## 2022-06-23 DIAGNOSIS — Z7982 Long term (current) use of aspirin: Secondary | ICD-10-CM | POA: Diagnosis not present

## 2022-06-23 DIAGNOSIS — R0602 Shortness of breath: Secondary | ICD-10-CM | POA: Diagnosis not present

## 2022-06-23 DIAGNOSIS — I252 Old myocardial infarction: Secondary | ICD-10-CM | POA: Diagnosis not present

## 2022-06-23 DIAGNOSIS — Z79899 Other long term (current) drug therapy: Secondary | ICD-10-CM | POA: Diagnosis not present

## 2022-06-23 DIAGNOSIS — Z8673 Personal history of transient ischemic attack (TIA), and cerebral infarction without residual deficits: Secondary | ICD-10-CM | POA: Diagnosis not present

## 2022-06-23 DIAGNOSIS — G4733 Obstructive sleep apnea (adult) (pediatric): Secondary | ICD-10-CM | POA: Diagnosis not present

## 2022-06-23 DIAGNOSIS — I10 Essential (primary) hypertension: Secondary | ICD-10-CM | POA: Diagnosis not present

## 2022-06-28 DIAGNOSIS — Z955 Presence of coronary angioplasty implant and graft: Secondary | ICD-10-CM | POA: Diagnosis not present

## 2022-06-28 DIAGNOSIS — I252 Old myocardial infarction: Secondary | ICD-10-CM | POA: Diagnosis not present

## 2022-06-28 DIAGNOSIS — Z7902 Long term (current) use of antithrombotics/antiplatelets: Secondary | ICD-10-CM | POA: Diagnosis not present

## 2022-06-28 DIAGNOSIS — G4733 Obstructive sleep apnea (adult) (pediatric): Secondary | ICD-10-CM | POA: Diagnosis not present

## 2022-06-28 DIAGNOSIS — Z8673 Personal history of transient ischemic attack (TIA), and cerebral infarction without residual deficits: Secondary | ICD-10-CM | POA: Diagnosis not present

## 2022-06-28 DIAGNOSIS — Z7982 Long term (current) use of aspirin: Secondary | ICD-10-CM | POA: Diagnosis not present

## 2022-06-28 DIAGNOSIS — E119 Type 2 diabetes mellitus without complications: Secondary | ICD-10-CM | POA: Diagnosis not present

## 2022-06-28 DIAGNOSIS — R0602 Shortness of breath: Secondary | ICD-10-CM | POA: Diagnosis not present

## 2022-06-28 DIAGNOSIS — I10 Essential (primary) hypertension: Secondary | ICD-10-CM | POA: Diagnosis not present

## 2022-06-28 DIAGNOSIS — Z79899 Other long term (current) drug therapy: Secondary | ICD-10-CM | POA: Diagnosis not present

## 2022-06-28 DIAGNOSIS — E785 Hyperlipidemia, unspecified: Secondary | ICD-10-CM | POA: Diagnosis not present

## 2022-06-29 DIAGNOSIS — G4733 Obstructive sleep apnea (adult) (pediatric): Secondary | ICD-10-CM | POA: Diagnosis not present

## 2022-06-30 DIAGNOSIS — Z7982 Long term (current) use of aspirin: Secondary | ICD-10-CM | POA: Diagnosis not present

## 2022-06-30 DIAGNOSIS — E785 Hyperlipidemia, unspecified: Secondary | ICD-10-CM | POA: Diagnosis not present

## 2022-06-30 DIAGNOSIS — Z955 Presence of coronary angioplasty implant and graft: Secondary | ICD-10-CM | POA: Diagnosis not present

## 2022-06-30 DIAGNOSIS — R0602 Shortness of breath: Secondary | ICD-10-CM | POA: Diagnosis not present

## 2022-06-30 DIAGNOSIS — Z79899 Other long term (current) drug therapy: Secondary | ICD-10-CM | POA: Diagnosis not present

## 2022-06-30 DIAGNOSIS — Z7902 Long term (current) use of antithrombotics/antiplatelets: Secondary | ICD-10-CM | POA: Diagnosis not present

## 2022-06-30 DIAGNOSIS — E119 Type 2 diabetes mellitus without complications: Secondary | ICD-10-CM | POA: Diagnosis not present

## 2022-06-30 DIAGNOSIS — Z8673 Personal history of transient ischemic attack (TIA), and cerebral infarction without residual deficits: Secondary | ICD-10-CM | POA: Diagnosis not present

## 2022-06-30 DIAGNOSIS — I10 Essential (primary) hypertension: Secondary | ICD-10-CM | POA: Diagnosis not present

## 2022-06-30 DIAGNOSIS — G4733 Obstructive sleep apnea (adult) (pediatric): Secondary | ICD-10-CM | POA: Diagnosis not present

## 2022-06-30 DIAGNOSIS — I252 Old myocardial infarction: Secondary | ICD-10-CM | POA: Diagnosis not present

## 2022-07-02 DIAGNOSIS — G4733 Obstructive sleep apnea (adult) (pediatric): Secondary | ICD-10-CM | POA: Diagnosis not present

## 2022-07-05 DIAGNOSIS — I252 Old myocardial infarction: Secondary | ICD-10-CM | POA: Diagnosis not present

## 2022-07-05 DIAGNOSIS — I10 Essential (primary) hypertension: Secondary | ICD-10-CM | POA: Diagnosis not present

## 2022-07-05 DIAGNOSIS — R0602 Shortness of breath: Secondary | ICD-10-CM | POA: Diagnosis not present

## 2022-07-05 DIAGNOSIS — G4733 Obstructive sleep apnea (adult) (pediatric): Secondary | ICD-10-CM | POA: Diagnosis not present

## 2022-07-06 ENCOUNTER — Other Ambulatory Visit (INDEPENDENT_AMBULATORY_CARE_PROVIDER_SITE_OTHER): Payer: BC Managed Care – PPO

## 2022-07-06 ENCOUNTER — Encounter: Payer: Self-pay | Admitting: Gastroenterology

## 2022-07-06 ENCOUNTER — Ambulatory Visit: Payer: Managed Care, Other (non HMO) | Admitting: Gastroenterology

## 2022-07-06 VITALS — BP 100/60 | HR 68 | Ht 69.0 in | Wt 255.8 lb

## 2022-07-06 DIAGNOSIS — Z83719 Family history of colon polyps, unspecified: Secondary | ICD-10-CM

## 2022-07-06 DIAGNOSIS — Z8719 Personal history of other diseases of the digestive system: Secondary | ICD-10-CM

## 2022-07-06 LAB — CBC WITH DIFFERENTIAL/PLATELET
Basophils Absolute: 0.1 10*3/uL (ref 0.0–0.1)
Basophils Relative: 1.5 % (ref 0.0–3.0)
Eosinophils Absolute: 0.2 10*3/uL (ref 0.0–0.7)
Eosinophils Relative: 2.8 % (ref 0.0–5.0)
HCT: 41.6 % (ref 39.0–52.0)
Hemoglobin: 14 g/dL (ref 13.0–17.0)
Lymphocytes Relative: 32 % (ref 12.0–46.0)
Lymphs Abs: 2.1 10*3/uL (ref 0.7–4.0)
MCHC: 33.7 g/dL (ref 30.0–36.0)
MCV: 80.8 fl (ref 78.0–100.0)
Monocytes Absolute: 0.4 10*3/uL (ref 0.1–1.0)
Monocytes Relative: 6.4 % (ref 3.0–12.0)
Neutro Abs: 3.8 10*3/uL (ref 1.4–7.7)
Neutrophils Relative %: 57.3 % (ref 43.0–77.0)
Platelets: 264 10*3/uL (ref 150.0–400.0)
RBC: 5.14 Mil/uL (ref 4.22–5.81)
RDW: 15.1 % (ref 11.5–15.5)
WBC: 6.6 10*3/uL (ref 4.0–10.5)

## 2022-07-06 LAB — PROTIME-INR
INR: 1 ratio (ref 0.8–1.0)
Prothrombin Time: 10.9 s (ref 9.6–13.1)

## 2022-07-06 LAB — COMPREHENSIVE METABOLIC PANEL
ALT: 32 U/L (ref 0–53)
AST: 23 U/L (ref 0–37)
Albumin: 4.6 g/dL (ref 3.5–5.2)
Alkaline Phosphatase: 43 U/L (ref 39–117)
BUN: 14 mg/dL (ref 6–23)
CO2: 29 mEq/L (ref 19–32)
Calcium: 9.8 mg/dL (ref 8.4–10.5)
Chloride: 102 mEq/L (ref 96–112)
Creatinine, Ser: 0.9 mg/dL (ref 0.40–1.50)
GFR: 95.74 mL/min (ref >60.00)
Glucose, Bld: 145 mg/dL — ABNORMAL HIGH (ref 70–99)
Potassium: 3.7 mEq/L (ref 3.5–5.1)
Sodium: 139 mEq/L (ref 135–145)
Total Bilirubin: 0.3 mg/dL (ref 0.2–1.2)
Total Protein: 7.6 g/dL (ref 6.0–8.3)

## 2022-07-06 NOTE — Patient Instructions (Addendum)
_______________________________________________________  If you are age 56 or older, your body mass index should be between 23-30. Your Body mass index is 37.78 kg/m. If this is out of the aforementioned range listed, please consider follow up with your Primary Care Provider.  If you are age 41 or younger, your body mass index should be between 19-25. Your Body mass index is 37.78 kg/m. If this is out of the aformentioned range listed, please consider follow up with your Primary Care Provider.   ________________________________________________________  The [facility] providers would like to encourage you to use [facility] to communicate with providers for non-urgent requests or questions.  Due to long hold times on the telephone, sending your provider a message by [facility] may be a faster and more efficient way to get a response.  Please allow 48 business hours for a response.  Please remember that this is for non-urgent requests.  _______________________________________________________  Your provider has requested that you go to the basement level for lab work before leaving today. Press "B" on the elevator. The lab is located at the first door on the left as you exit the elevator.  Continue current medications  Please call in 1 year to schedule an EGD/colonoscopy. Please call 2 months prior to schedule this. A letter will be sent as it gets closer. A cardiac clearance will be needed  Thank you,  Dr. Jackquline Denmark

## 2022-07-06 NOTE — Progress Notes (Signed)
Chief Complaint: For GI eval  Referring Provider:  Marco Collie, MD      ASSESSMENT AND PLAN;   #1. H/O melena (resolved). Heme neg today on rectal exam.  #2. CAD s/p recent stent May 25, 2022  #3. FH polyps (Dad < 60)  #4. IBS-D with postcholecystectomy diarrhea.  Has responded well to Bentyl.   Plan: -CBC, CMP, INR -Continue omeprazole '20mg'$  po BID -EGD/Colon in 1 yr after cardiology clearance.  Certainly earlier, if there is any active bleeding.   HPI:    Jared Wood is a 56 y.o. male  With medical problems as listed below including CAD s/p MI with LAD stenting May 25, 2022 on aspirin/Plavix (EF 60-65%), DM2, CVA, HTN, HLD, GERD, neuropathy.  Here for high risk colorectal cancer screening.  Not having any significant GI problems except had "dark stools" off and on which sounded like blood.  He has not been taking any Pepto-Bismol.  Has been taking multivitamins with iron.  No nausea, vomiting, heartburn, regurgitation, odynophagia or dysphagia. No hematochezia. No unintentional weight loss. No abdominal pain.  Has intermittent diarrhea, attributed to IBS.  Also with element of postcholecystectomy diarrhea.  Rare constipation.  Had negative colonoscopy with random colon biopsies as below.  No sodas, chocolates, chewing gums, artificial sweeteners and candy. No NSAIDs  Past GI work-up: EGD 09/20/2011 due to history of melena, epigastric pain.  Mild gastritis.  Otherwise normal EGD.  Negative small bowel biopsies for celiac disease.  Negative CLO test.  Colonoscopy 01/17/2017: Small internal hemorrhoids.  Otherwise normal colonoscopy.  Negative random colon biopsies for microscopic colitis.  Repeat in 5 years due to family history of colon polyps. Past Medical History:  Diagnosis Date   Alteration of awareness 06/17/2019   Anxiety    Arthritis    Chronic migraine without aura, with intractable migraine, so stated, with status migrainosus 06/17/2019   Chronic pain  of both knees 07/24/2015   Conversion disorder    Conversion disorder with abnormal movement, acute episode, without psychological stressor 04/12/2015   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   Depression    Diabetes mellitus without complication ([place])    Type 2   Essential hypertension 04/07/2015   GERD (gastroesophageal reflux disease)    Hard of hearing    Headache 06/30/2015   Heart disease    Hemiplegic migraine    History of bleeding ulcers    History of Clostridium difficile colitis    History of kidney stones    Hyperlipemia    Hypertension    Left-sided weakness    Migraine    Migraine with aura and without status migrainosus, not intractable 06/17/2019   Mixed hyperlipidemia 04/07/2015   Neuropathy    Pain in joint of left shoulder 07/24/2015   PONV (postoperative nausea and vomiting)    Nausea   Primary osteoarthritis of left knee 08/14/2015   S/P total knee replacement 12/01/2015   Skin cancer 2019   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/2015   Overview:  Most likely explanation for her symptoms at admission   Status post total right knee replacement 12/16/2015   Stroke ([place]) 04/2015   pt's wife said they were told by a doctor this may have been conversion disorder. was given TPA and underwent rehabilitation.   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 Other wise normal colonoscopy   COLONOSCOPY W/ ENDOSCOPIC [place]     CORONARY ANGIOPLASTY  2008   ESOPHAGOGASTRODUODENOSCOPY  09/20/2011   Mild gastritis. Otherwise normal esophagogastroduodenoscopy   HAND SURGERY Left 2001   hand reconstruction   HERNIA REPAIR     LITHOTRIPSY     SHOULDER ARTHROSCOPY WITH OPEN ROTATOR CUFF REPAIR Right    TONSILLECTOMY  1972   TOTAL KNEE  ARTHROPLASTY Right 12/01/2015   Procedure: TOTAL KNEE ARTHROPLASTY;  Surgeon: Vickey Huger, MD;  Location: [facility];  Service: Orthopedics;  Laterality: Right;    Family History  Problem Relation Age of Onset   Myocarditis Mother    Prostate cancer Father    Liver cancer Father    Kidney cancer Father    Other Father        lymph nodes   COPD Father    Emphysema Father    Congestive Heart Failure Father    High blood pressure Father    Arthritis Father    Migraines Neg Hx     Social History   Tobacco Use   Smoking status: Former   Smokeless tobacco: Current    Types: Chew   Tobacco comments:    uses 1-2 cans/day  Vaping Use   Vaping Use: Former  Substance Use Topics   Alcohol use: Yes    Comment: occasional   Drug use: No    Comment: quit hard drugs in 1990    Current Outpatient Medications  Medication Sig Dispense Refill   AJOVY 225 MG/1.5ML SOAJ Inject 1.5 mLs into the skin every 30 (thirty) days. 1.5 mL 6   ARIPiprazole (ABILIFY) 5 MG tablet Take 5 mg by mouth daily.     aspirin 325 MG EC tablet Take 1 tablet by mouth daily.     clopidogrel (PLAVIX) 75 MG tablet Take 75 mg by mouth daily.     dapagliflozin propanediol (FARXIGA) 10 MG TABS tablet Take 10 mg by mouth daily.     dicyclomine (BENTYL) 10 MG capsule Take 10 mg by mouth in the morning and at bedtime.     divalproex (DEPAKOTE ER) 500 MG 24 hr tablet Take 1 tablet by mouth daily.     escitalopram (LEXAPRO) 20 MG tablet Take 20 mg by mouth daily.     fenofibrate (TRICOR) 145 MG tablet Take 145 mg by mouth daily.     folic acid (FOLVITE) 638 MCG tablet Take 800 mcg by mouth daily with breakfast.      gabapentin (NEURONTIN) 400 MG capsule Take 1 capsule (400 mg total) by mouth 3 (three) times daily. 90 capsule 3   levocetirizine (XYZAL) 5 MG tablet Take 5 mg by mouth at bedtime.     losartan (COZAAR) 25 MG tablet Take 25 mg by mouth daily.     losartan (COZAAR) 50 MG tablet Take by mouth.     meloxicam (MOBIC)  7.5 MG tablet Take 1 tablet (7.5 mg total) by mouth daily. 30 tablet 1   methocarbamol (ROBAXIN) 500 MG tablet Take 1 tablet (500 mg total) by mouth every 8 (eight) hours as needed for muscle spasms. 90 tablet 2   Multiple Vitamins-Minerals (CENTRUM MEN) TABS Take 1 tablet by mouth daily with breakfast.     nitroGLYCERIN (NITROSTAT) 0.4 MG SL tablet Place 0.4 mg under the tongue every 5 (five) minutes as needed for chest pain.     omeprazole (PRILOSEC) 20 MG  capsule Take 20 mg by mouth 2 (two) times daily before a meal.     rosuvastatin (CRESTOR) 40 MG tablet Take 40 mg by mouth daily.     Semaglutide, 1 MG/DOSE, (OZEMPIC, 1 MG/DOSE,) 4 MG/3ML SOPN Inject 1 mg into the skin once a week. On Sunday     topiramate (TOPAMAX) 50 MG tablet Take 50 mg by mouth 2 (two) times daily.     Ubrogepant (UBRELVY) 100 MG TABS Take 100 mg by mouth every 2 (two) hours as needed. Maximum '200mg'$  a day. 16 tablet 11   No current facility-administered medications for this visit.    Allergies  Allergen Reactions   Meperidine Anaphylaxis and Other (See Comments)    Confusion and behavioral changes (makes patient things negative things)     Latex Rash   Tape Rash and Other (See Comments)    Can stay on for ONLY a limited length of time    Review of Systems:  Constitutional: Denies fever, chills, diaphoresis, appetite change and fatigue.  HEENT: Denies photophobia, eye pain, redness, hearing loss, ear pain, congestion, sore throat, rhinorrhea, sneezing, mouth sores, neck pain, neck stiffness and tinnitus.   Respiratory: Denies SOB, DOE, cough, chest tightness,  and wheezing.   Cardiovascular: Denies chest pain, palpitations and leg swelling.  Genitourinary: Denies dysuria, urgency, frequency, hematuria, flank pain and difficulty urinating.  Musculoskeletal: Denies myalgias, back pain, joint swelling, arthralgias and gait problem.  Skin: No rash.  Neurological: Denies dizziness, seizures, syncope, weakness,  light-headedness, numbness and headaches.  Hematological: Denies adenopathy. Easy bruising, personal or family bleeding history  Psychiatric/Behavioral: No anxiety or depression     Physical Exam:    There were no vitals taken for this visit. Wt Readings from Last 3 Encounters:  09/29/21 260 lb (117.9 kg)  04/20/21 260 lb (117.9 kg)  02/19/21 253 lb (114.8 kg)   Constitutional:  Well-developed, in no acute distress. Psychiatric: Normal mood and affect. Behavior is normal. HEENT: Pupils normal.  Conjunctivae are normal. No scleral icterus. Cardiovascular: Normal rate, regular rhythm. No edema Pulmonary/chest: Effort normal and breath sounds normal. No wheezing, rales or rhonchi. Abdominal: Soft, nondistended. Nontender. Bowel sounds active throughout. There are no masses palpable. No hepatomegaly. Rectal: Brown stool.  Heme-negative. Neurological: Alert and oriented to person place and time. Skin: Skin is warm and dry. No rashes noted.  Data Reviewed: I have personally reviewed following labs and imaging studies  CBC:    Latest Ref Rng & Units 04/21/2021    4:55 AM 04/20/2021    5:28 PM 04/03/2019   10:45 PM  CBC  WBC 4.0 - 10.5 K/uL 5.9  5.4    Hemoglobin 13.0 - 17.0 g/dL 13.3  12.3  13.3   Hematocrit 39.0 - 52.0 % 41.1  38.1  39.0   Platelets 150 - 400 K/uL 243  215      CMP:    Latest Ref Rng & Units 04/21/2021    4:55 AM 04/20/2021    5:28 PM 04/03/2019   10:45 PM  CMP  Glucose 70 - 99 mg/dL 226  216    BUN 6 - 20 mg/dL 14  15    Creatinine 0.61 - 1.24 mg/dL 0.86  0.85    Sodium 135 - 145 mmol/L 139  139  141   Potassium 3.5 - 5.1 mmol/L 4.0  3.6  3.5   Chloride 98 - 111 mmol/L 108  108    CO2 22 - 32 mmol/L 23  21  Calcium 8.9 - 10.3 mg/dL 9.4  8.8    Total Protein 6.5 - 8.1 g/dL  6.0    Total Bilirubin 0.3 - 1.2 mg/dL  0.5    Alkaline Phos 38 - 126 U/L  45    AST 15 - 41 U/L  27    ALT 0 - 44 U/L  35          Carmell Austria, MD 07/06/2022, 3:11 PM  Cc:  Marco Collie, MD

## 2022-07-07 DIAGNOSIS — I252 Old myocardial infarction: Secondary | ICD-10-CM | POA: Diagnosis not present

## 2022-07-07 DIAGNOSIS — G4733 Obstructive sleep apnea (adult) (pediatric): Secondary | ICD-10-CM | POA: Diagnosis not present

## 2022-07-07 DIAGNOSIS — I10 Essential (primary) hypertension: Secondary | ICD-10-CM | POA: Diagnosis not present

## 2022-07-07 DIAGNOSIS — R0602 Shortness of breath: Secondary | ICD-10-CM | POA: Diagnosis not present

## 2022-07-09 DIAGNOSIS — I252 Old myocardial infarction: Secondary | ICD-10-CM | POA: Diagnosis not present

## 2022-07-09 DIAGNOSIS — I10 Essential (primary) hypertension: Secondary | ICD-10-CM | POA: Diagnosis not present

## 2022-07-09 DIAGNOSIS — R0602 Shortness of breath: Secondary | ICD-10-CM | POA: Diagnosis not present

## 2022-07-09 DIAGNOSIS — G4733 Obstructive sleep apnea (adult) (pediatric): Secondary | ICD-10-CM | POA: Diagnosis not present

## 2022-07-12 DIAGNOSIS — I10 Essential (primary) hypertension: Secondary | ICD-10-CM | POA: Diagnosis not present

## 2022-07-12 DIAGNOSIS — G4733 Obstructive sleep apnea (adult) (pediatric): Secondary | ICD-10-CM | POA: Diagnosis not present

## 2022-07-12 DIAGNOSIS — I252 Old myocardial infarction: Secondary | ICD-10-CM | POA: Diagnosis not present

## 2022-07-12 DIAGNOSIS — R0602 Shortness of breath: Secondary | ICD-10-CM | POA: Diagnosis not present

## 2022-07-14 DIAGNOSIS — I10 Essential (primary) hypertension: Secondary | ICD-10-CM | POA: Diagnosis not present

## 2022-07-14 DIAGNOSIS — G4733 Obstructive sleep apnea (adult) (pediatric): Secondary | ICD-10-CM | POA: Diagnosis not present

## 2022-07-14 DIAGNOSIS — R0602 Shortness of breath: Secondary | ICD-10-CM | POA: Diagnosis not present

## 2022-07-14 DIAGNOSIS — I252 Old myocardial infarction: Secondary | ICD-10-CM | POA: Diagnosis not present

## 2022-07-16 DIAGNOSIS — G4733 Obstructive sleep apnea (adult) (pediatric): Secondary | ICD-10-CM | POA: Diagnosis not present

## 2022-07-16 DIAGNOSIS — R0602 Shortness of breath: Secondary | ICD-10-CM | POA: Diagnosis not present

## 2022-07-16 DIAGNOSIS — I252 Old myocardial infarction: Secondary | ICD-10-CM | POA: Diagnosis not present

## 2022-07-16 DIAGNOSIS — I10 Essential (primary) hypertension: Secondary | ICD-10-CM | POA: Diagnosis not present

## 2022-07-19 DIAGNOSIS — G4733 Obstructive sleep apnea (adult) (pediatric): Secondary | ICD-10-CM | POA: Diagnosis not present

## 2022-07-19 DIAGNOSIS — I10 Essential (primary) hypertension: Secondary | ICD-10-CM | POA: Diagnosis not present

## 2022-07-19 DIAGNOSIS — R0602 Shortness of breath: Secondary | ICD-10-CM | POA: Diagnosis not present

## 2022-07-19 DIAGNOSIS — I252 Old myocardial infarction: Secondary | ICD-10-CM | POA: Diagnosis not present

## 2022-07-21 DIAGNOSIS — R0602 Shortness of breath: Secondary | ICD-10-CM | POA: Diagnosis not present

## 2022-07-21 DIAGNOSIS — G4733 Obstructive sleep apnea (adult) (pediatric): Secondary | ICD-10-CM | POA: Diagnosis not present

## 2022-07-21 DIAGNOSIS — I252 Old myocardial infarction: Secondary | ICD-10-CM | POA: Diagnosis not present

## 2022-07-21 DIAGNOSIS — I10 Essential (primary) hypertension: Secondary | ICD-10-CM | POA: Diagnosis not present

## 2022-07-22 DIAGNOSIS — G4733 Obstructive sleep apnea (adult) (pediatric): Secondary | ICD-10-CM | POA: Diagnosis not present

## 2022-07-27 DIAGNOSIS — R0602 Shortness of breath: Secondary | ICD-10-CM | POA: Diagnosis not present

## 2022-07-27 DIAGNOSIS — I252 Old myocardial infarction: Secondary | ICD-10-CM | POA: Diagnosis not present

## 2022-07-27 DIAGNOSIS — G4733 Obstructive sleep apnea (adult) (pediatric): Secondary | ICD-10-CM | POA: Diagnosis not present

## 2022-07-27 DIAGNOSIS — I10 Essential (primary) hypertension: Secondary | ICD-10-CM | POA: Diagnosis not present

## 2022-07-28 DIAGNOSIS — I252 Old myocardial infarction: Secondary | ICD-10-CM | POA: Diagnosis not present

## 2022-07-28 DIAGNOSIS — G4733 Obstructive sleep apnea (adult) (pediatric): Secondary | ICD-10-CM | POA: Diagnosis not present

## 2022-07-28 DIAGNOSIS — R0602 Shortness of breath: Secondary | ICD-10-CM | POA: Diagnosis not present

## 2022-07-28 DIAGNOSIS — I10 Essential (primary) hypertension: Secondary | ICD-10-CM | POA: Diagnosis not present

## 2022-07-30 DIAGNOSIS — I10 Essential (primary) hypertension: Secondary | ICD-10-CM | POA: Diagnosis not present

## 2022-07-30 DIAGNOSIS — R0602 Shortness of breath: Secondary | ICD-10-CM | POA: Diagnosis not present

## 2022-07-30 DIAGNOSIS — I252 Old myocardial infarction: Secondary | ICD-10-CM | POA: Diagnosis not present

## 2022-07-30 DIAGNOSIS — G4733 Obstructive sleep apnea (adult) (pediatric): Secondary | ICD-10-CM | POA: Diagnosis not present

## 2022-08-03 DIAGNOSIS — I10 Essential (primary) hypertension: Secondary | ICD-10-CM | POA: Diagnosis not present

## 2022-08-03 DIAGNOSIS — G4733 Obstructive sleep apnea (adult) (pediatric): Secondary | ICD-10-CM | POA: Diagnosis not present

## 2022-08-03 DIAGNOSIS — R0602 Shortness of breath: Secondary | ICD-10-CM | POA: Diagnosis not present

## 2022-08-03 DIAGNOSIS — I252 Old myocardial infarction: Secondary | ICD-10-CM | POA: Diagnosis not present

## 2022-08-06 DIAGNOSIS — G4733 Obstructive sleep apnea (adult) (pediatric): Secondary | ICD-10-CM | POA: Diagnosis not present

## 2022-08-06 DIAGNOSIS — I10 Essential (primary) hypertension: Secondary | ICD-10-CM | POA: Diagnosis not present

## 2022-08-06 DIAGNOSIS — I252 Old myocardial infarction: Secondary | ICD-10-CM | POA: Diagnosis not present

## 2022-08-06 DIAGNOSIS — R0602 Shortness of breath: Secondary | ICD-10-CM | POA: Diagnosis not present

## 2022-08-09 DIAGNOSIS — R0602 Shortness of breath: Secondary | ICD-10-CM | POA: Diagnosis not present

## 2022-08-09 DIAGNOSIS — G4733 Obstructive sleep apnea (adult) (pediatric): Secondary | ICD-10-CM | POA: Diagnosis not present

## 2022-08-09 DIAGNOSIS — I10 Essential (primary) hypertension: Secondary | ICD-10-CM | POA: Diagnosis not present

## 2022-08-09 DIAGNOSIS — I252 Old myocardial infarction: Secondary | ICD-10-CM | POA: Diagnosis not present

## 2022-08-18 DIAGNOSIS — R0602 Shortness of breath: Secondary | ICD-10-CM | POA: Diagnosis not present

## 2022-08-18 DIAGNOSIS — I252 Old myocardial infarction: Secondary | ICD-10-CM | POA: Diagnosis not present

## 2022-08-18 DIAGNOSIS — I10 Essential (primary) hypertension: Secondary | ICD-10-CM | POA: Diagnosis not present

## 2022-08-18 DIAGNOSIS — G4733 Obstructive sleep apnea (adult) (pediatric): Secondary | ICD-10-CM | POA: Diagnosis not present

## 2022-08-20 DIAGNOSIS — I252 Old myocardial infarction: Secondary | ICD-10-CM | POA: Diagnosis not present

## 2022-08-20 DIAGNOSIS — I10 Essential (primary) hypertension: Secondary | ICD-10-CM | POA: Diagnosis not present

## 2022-08-20 DIAGNOSIS — G4733 Obstructive sleep apnea (adult) (pediatric): Secondary | ICD-10-CM | POA: Diagnosis not present

## 2022-08-20 DIAGNOSIS — R0602 Shortness of breath: Secondary | ICD-10-CM | POA: Diagnosis not present

## 2022-08-23 DIAGNOSIS — E1169 Type 2 diabetes mellitus with other specified complication: Secondary | ICD-10-CM | POA: Diagnosis not present

## 2022-08-25 DIAGNOSIS — I252 Old myocardial infarction: Secondary | ICD-10-CM | POA: Diagnosis not present

## 2022-08-25 DIAGNOSIS — I10 Essential (primary) hypertension: Secondary | ICD-10-CM | POA: Diagnosis not present

## 2022-08-25 DIAGNOSIS — R0602 Shortness of breath: Secondary | ICD-10-CM | POA: Diagnosis not present

## 2022-08-25 DIAGNOSIS — G4733 Obstructive sleep apnea (adult) (pediatric): Secondary | ICD-10-CM | POA: Diagnosis not present

## 2022-08-27 DIAGNOSIS — I10 Essential (primary) hypertension: Secondary | ICD-10-CM | POA: Diagnosis not present

## 2022-08-27 DIAGNOSIS — I252 Old myocardial infarction: Secondary | ICD-10-CM | POA: Diagnosis not present

## 2022-08-27 DIAGNOSIS — G4733 Obstructive sleep apnea (adult) (pediatric): Secondary | ICD-10-CM | POA: Diagnosis not present

## 2022-08-27 DIAGNOSIS — R0602 Shortness of breath: Secondary | ICD-10-CM | POA: Diagnosis not present

## 2022-08-30 DIAGNOSIS — E782 Mixed hyperlipidemia: Secondary | ICD-10-CM | POA: Diagnosis not present

## 2022-08-30 DIAGNOSIS — F339 Major depressive disorder, recurrent, unspecified: Secondary | ICD-10-CM | POA: Diagnosis not present

## 2022-08-30 DIAGNOSIS — I252 Old myocardial infarction: Secondary | ICD-10-CM | POA: Diagnosis not present

## 2022-08-30 DIAGNOSIS — E1169 Type 2 diabetes mellitus with other specified complication: Secondary | ICD-10-CM | POA: Diagnosis not present

## 2022-08-30 DIAGNOSIS — Z6838 Body mass index (BMI) 38.0-38.9, adult: Secondary | ICD-10-CM | POA: Diagnosis not present

## 2022-08-30 DIAGNOSIS — G4733 Obstructive sleep apnea (adult) (pediatric): Secondary | ICD-10-CM | POA: Diagnosis not present

## 2022-08-30 DIAGNOSIS — I10 Essential (primary) hypertension: Secondary | ICD-10-CM | POA: Diagnosis not present

## 2022-08-30 DIAGNOSIS — Z1331 Encounter for screening for depression: Secondary | ICD-10-CM | POA: Diagnosis not present

## 2022-08-30 DIAGNOSIS — R0602 Shortness of breath: Secondary | ICD-10-CM | POA: Diagnosis not present

## 2022-09-01 DIAGNOSIS — G4733 Obstructive sleep apnea (adult) (pediatric): Secondary | ICD-10-CM | POA: Diagnosis not present

## 2022-09-01 DIAGNOSIS — I10 Essential (primary) hypertension: Secondary | ICD-10-CM | POA: Diagnosis not present

## 2022-09-01 DIAGNOSIS — R0602 Shortness of breath: Secondary | ICD-10-CM | POA: Diagnosis not present

## 2022-09-01 DIAGNOSIS — I252 Old myocardial infarction: Secondary | ICD-10-CM | POA: Diagnosis not present

## 2022-09-06 DIAGNOSIS — I252 Old myocardial infarction: Secondary | ICD-10-CM | POA: Diagnosis not present

## 2022-09-06 DIAGNOSIS — G4733 Obstructive sleep apnea (adult) (pediatric): Secondary | ICD-10-CM | POA: Diagnosis not present

## 2022-09-06 DIAGNOSIS — R0602 Shortness of breath: Secondary | ICD-10-CM | POA: Diagnosis not present

## 2022-09-06 DIAGNOSIS — I10 Essential (primary) hypertension: Secondary | ICD-10-CM | POA: Diagnosis not present

## 2022-09-08 DIAGNOSIS — R0602 Shortness of breath: Secondary | ICD-10-CM | POA: Diagnosis not present

## 2022-09-08 DIAGNOSIS — I252 Old myocardial infarction: Secondary | ICD-10-CM | POA: Diagnosis not present

## 2022-09-08 DIAGNOSIS — G4733 Obstructive sleep apnea (adult) (pediatric): Secondary | ICD-10-CM | POA: Diagnosis not present

## 2022-09-08 DIAGNOSIS — I10 Essential (primary) hypertension: Secondary | ICD-10-CM | POA: Diagnosis not present

## 2022-09-10 DIAGNOSIS — G4733 Obstructive sleep apnea (adult) (pediatric): Secondary | ICD-10-CM | POA: Diagnosis not present

## 2022-09-10 DIAGNOSIS — I10 Essential (primary) hypertension: Secondary | ICD-10-CM | POA: Diagnosis not present

## 2022-09-10 DIAGNOSIS — R0602 Shortness of breath: Secondary | ICD-10-CM | POA: Diagnosis not present

## 2022-09-10 DIAGNOSIS — I252 Old myocardial infarction: Secondary | ICD-10-CM | POA: Diagnosis not present

## 2022-09-13 DIAGNOSIS — I10 Essential (primary) hypertension: Secondary | ICD-10-CM | POA: Diagnosis not present

## 2022-09-13 DIAGNOSIS — R0602 Shortness of breath: Secondary | ICD-10-CM | POA: Diagnosis not present

## 2022-09-13 DIAGNOSIS — G4733 Obstructive sleep apnea (adult) (pediatric): Secondary | ICD-10-CM | POA: Diagnosis not present

## 2022-09-13 DIAGNOSIS — I252 Old myocardial infarction: Secondary | ICD-10-CM | POA: Diagnosis not present

## 2022-09-15 DIAGNOSIS — I252 Old myocardial infarction: Secondary | ICD-10-CM | POA: Diagnosis not present

## 2022-09-15 DIAGNOSIS — R0602 Shortness of breath: Secondary | ICD-10-CM | POA: Diagnosis not present

## 2022-09-15 DIAGNOSIS — I10 Essential (primary) hypertension: Secondary | ICD-10-CM | POA: Diagnosis not present

## 2022-09-15 DIAGNOSIS — G4733 Obstructive sleep apnea (adult) (pediatric): Secondary | ICD-10-CM | POA: Diagnosis not present

## 2022-09-17 DIAGNOSIS — I10 Essential (primary) hypertension: Secondary | ICD-10-CM | POA: Diagnosis not present

## 2022-09-17 DIAGNOSIS — I252 Old myocardial infarction: Secondary | ICD-10-CM | POA: Diagnosis not present

## 2022-09-17 DIAGNOSIS — R0602 Shortness of breath: Secondary | ICD-10-CM | POA: Diagnosis not present

## 2022-09-17 DIAGNOSIS — G4733 Obstructive sleep apnea (adult) (pediatric): Secondary | ICD-10-CM | POA: Diagnosis not present

## 2022-09-27 DIAGNOSIS — I10 Essential (primary) hypertension: Secondary | ICD-10-CM | POA: Diagnosis not present

## 2022-09-27 DIAGNOSIS — Z955 Presence of coronary angioplasty implant and graft: Secondary | ICD-10-CM | POA: Diagnosis not present

## 2022-09-27 DIAGNOSIS — I2510 Atherosclerotic heart disease of native coronary artery without angina pectoris: Secondary | ICD-10-CM | POA: Diagnosis not present

## 2022-09-27 DIAGNOSIS — E782 Mixed hyperlipidemia: Secondary | ICD-10-CM | POA: Diagnosis not present

## 2022-09-29 DIAGNOSIS — G4733 Obstructive sleep apnea (adult) (pediatric): Secondary | ICD-10-CM | POA: Diagnosis not present

## 2022-09-29 DIAGNOSIS — I10 Essential (primary) hypertension: Secondary | ICD-10-CM | POA: Diagnosis not present

## 2022-09-29 DIAGNOSIS — R0602 Shortness of breath: Secondary | ICD-10-CM | POA: Diagnosis not present

## 2022-09-29 DIAGNOSIS — I252 Old myocardial infarction: Secondary | ICD-10-CM | POA: Diagnosis not present

## 2022-10-01 DIAGNOSIS — G4733 Obstructive sleep apnea (adult) (pediatric): Secondary | ICD-10-CM | POA: Diagnosis not present

## 2022-10-01 DIAGNOSIS — I10 Essential (primary) hypertension: Secondary | ICD-10-CM | POA: Diagnosis not present

## 2022-10-01 DIAGNOSIS — R0602 Shortness of breath: Secondary | ICD-10-CM | POA: Diagnosis not present

## 2022-10-01 DIAGNOSIS — I252 Old myocardial infarction: Secondary | ICD-10-CM | POA: Diagnosis not present

## 2022-10-04 ENCOUNTER — Ambulatory Visit: Payer: BC Managed Care – PPO | Admitting: Adult Health

## 2022-10-04 DIAGNOSIS — R0602 Shortness of breath: Secondary | ICD-10-CM | POA: Diagnosis not present

## 2022-10-04 DIAGNOSIS — I252 Old myocardial infarction: Secondary | ICD-10-CM | POA: Diagnosis not present

## 2022-10-04 DIAGNOSIS — G4733 Obstructive sleep apnea (adult) (pediatric): Secondary | ICD-10-CM | POA: Diagnosis not present

## 2022-10-04 DIAGNOSIS — I10 Essential (primary) hypertension: Secondary | ICD-10-CM | POA: Diagnosis not present

## 2022-10-06 DIAGNOSIS — R0602 Shortness of breath: Secondary | ICD-10-CM | POA: Diagnosis not present

## 2022-10-06 DIAGNOSIS — I10 Essential (primary) hypertension: Secondary | ICD-10-CM | POA: Diagnosis not present

## 2022-10-06 DIAGNOSIS — I252 Old myocardial infarction: Secondary | ICD-10-CM | POA: Diagnosis not present

## 2022-10-06 DIAGNOSIS — G4733 Obstructive sleep apnea (adult) (pediatric): Secondary | ICD-10-CM | POA: Diagnosis not present

## 2022-10-27 DIAGNOSIS — E119 Type 2 diabetes mellitus without complications: Secondary | ICD-10-CM | POA: Diagnosis not present

## 2022-11-06 DIAGNOSIS — G4733 Obstructive sleep apnea (adult) (pediatric): Secondary | ICD-10-CM | POA: Diagnosis not present

## 2022-11-09 DIAGNOSIS — I493 Ventricular premature depolarization: Secondary | ICD-10-CM | POA: Diagnosis not present

## 2022-11-26 DIAGNOSIS — E1169 Type 2 diabetes mellitus with other specified complication: Secondary | ICD-10-CM | POA: Diagnosis not present

## 2022-11-30 DIAGNOSIS — R002 Palpitations: Secondary | ICD-10-CM | POA: Diagnosis not present

## 2022-12-06 DIAGNOSIS — G4733 Obstructive sleep apnea (adult) (pediatric): Secondary | ICD-10-CM | POA: Diagnosis not present

## 2022-12-06 DIAGNOSIS — Z6839 Body mass index (BMI) 39.0-39.9, adult: Secondary | ICD-10-CM | POA: Diagnosis not present

## 2022-12-06 DIAGNOSIS — E782 Mixed hyperlipidemia: Secondary | ICD-10-CM | POA: Diagnosis not present

## 2022-12-06 DIAGNOSIS — E1169 Type 2 diabetes mellitus with other specified complication: Secondary | ICD-10-CM | POA: Diagnosis not present

## 2022-12-06 DIAGNOSIS — F339 Major depressive disorder, recurrent, unspecified: Secondary | ICD-10-CM | POA: Diagnosis not present

## 2022-12-07 ENCOUNTER — Ambulatory Visit: Payer: BC Managed Care – PPO | Admitting: Adult Health

## 2022-12-14 DIAGNOSIS — M25512 Pain in left shoulder: Secondary | ICD-10-CM | POA: Diagnosis not present

## 2022-12-14 DIAGNOSIS — M19012 Primary osteoarthritis, left shoulder: Secondary | ICD-10-CM | POA: Diagnosis not present

## 2022-12-17 DIAGNOSIS — M19012 Primary osteoarthritis, left shoulder: Secondary | ICD-10-CM | POA: Diagnosis not present

## 2023-01-06 DIAGNOSIS — G4733 Obstructive sleep apnea (adult) (pediatric): Secondary | ICD-10-CM | POA: Diagnosis not present

## 2023-02-05 DIAGNOSIS — G4733 Obstructive sleep apnea (adult) (pediatric): Secondary | ICD-10-CM | POA: Diagnosis not present

## 2023-03-03 DIAGNOSIS — E1169 Type 2 diabetes mellitus with other specified complication: Secondary | ICD-10-CM | POA: Diagnosis not present

## 2023-03-08 DIAGNOSIS — G4733 Obstructive sleep apnea (adult) (pediatric): Secondary | ICD-10-CM | POA: Diagnosis not present

## 2023-03-15 DIAGNOSIS — Z6838 Body mass index (BMI) 38.0-38.9, adult: Secondary | ICD-10-CM | POA: Diagnosis not present

## 2023-03-15 DIAGNOSIS — E782 Mixed hyperlipidemia: Secondary | ICD-10-CM | POA: Diagnosis not present

## 2023-03-15 DIAGNOSIS — E1169 Type 2 diabetes mellitus with other specified complication: Secondary | ICD-10-CM | POA: Diagnosis not present

## 2023-03-15 DIAGNOSIS — E1149 Type 2 diabetes mellitus with other diabetic neurological complication: Secondary | ICD-10-CM | POA: Diagnosis not present

## 2023-03-15 DIAGNOSIS — F339 Major depressive disorder, recurrent, unspecified: Secondary | ICD-10-CM | POA: Diagnosis not present

## 2023-04-08 DIAGNOSIS — G4733 Obstructive sleep apnea (adult) (pediatric): Secondary | ICD-10-CM | POA: Diagnosis not present

## 2023-04-11 NOTE — Progress Notes (Deleted)
Jared Wood: Jared Wood DOB: July 15, 1966  REASON FOR VISIT: follow up HISTORY FROM: Jared Wood PRIMARY NEUROLOGIST: Dr. Frances Furbish   HISTORY OF PRESENT ILLNESS: Today 04/11/23:  Jared Wood is a 57 y.o. male with a history of Migraine headaches. Returns today for follow-up. Remains on Ajovy. Headaches remain under good control. Only headaches a month. [name] or aleve works well to alleviate his migraines.     09/29/21: Jared Wood is a 57 year old male with a history of migraine headaches.  He returns today for follow-up.  He reports that his headaches have been under good control with Ajovy.  He has approximately 2-3 migraines a month.  Typically can take Aleve or Ubrelvy and it does help the headache.  He states that he still gets 2-3 dull headaches a week for most of the time he does not even notice them.  HISTORY (copied from Dr. Trevor Mace note) Doing great on Aimovig. But insurance isn;t paying for it. Giving 6 months of samples, 70mg  so 2 shots a month instead of 1 shot. Will apply for Jared Wood assistance and if that doesn;t work withh try Ajovy or [employer] Aimovig tremendously helpful. No significant side effects (discussed the SE of aimovig), it wears off in 3 weeks, so try 70mg  every 2 weeks and we will try to get 70mg  every 2 week from Jared Wood assistance.   Interval history 09/24/2019: He has had an exceptional response to Aimovig.  His wife is here today and she says she has noticed a tremendous difference in them.  He has not had significant headaches, although he does still wake up with headaches and we are pending sleep evaluation with Dr. Vickey Huger to evaluate for sleep apnea which has been diagnosed with in the past and given his TIA I think this is important for him to follow through with.  I spoke with Sheena in our sleep team office, she was initially unable to get a release form for his past sleep records I did talk to her today and give her the release, I also provided the phone number for  Shaner directly in case they do not hear from her (his voicemail was full last time we tried to call) and then they will follow-up.  At this point we will keep him on the Aimovig.  The sleep team will call.  We did schedule an EEG, he has been fine recently, his loss of consciousness in the past was not consistent with seizure-like activity, at this point they like to hold off on the EEG but I did state that if this happens again they definitely need to have routine EEG possibly long-term EEG, he still cannot drive for 6 months after loss of consciousness.   HPI:  Jared Wood is a 57 y.o. male here as requested by Abner Greenspan, MD for migraines.  I reviewed notes from Dr. Yetta Flock.  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  Jared Wood had an episode of alteration of mentation, EMS took him to [facility] and he was diagnosed with complicated migraines. However he had non-organic neuro findings.    Started 04/07/2015 he has had these spells. He went to the emergency room, had tpa, left-sided weakness, he still feels it is weak. Ever since then he has headaches. Headaches in the morning, snoring, obesity, has been  diagnosed with OSA in the past not on cpap, excessive daytime somnolence. Hasn't been tested since 2017 at [facility] [place] [facility]. Headache sin the back of the head. Daily. He has migraines, they start on the top of the head, the whole head with presure, light and sound sensitivity, sound is worse, nausea, no vomiting, movement makes it worse, dark room makes it better, last 24 hours. He is taking Tylenol, ibuprofen, also for arthritis, bc powder. He was mowing his lawn a  few weeks ago, he had a headache, worsening, he has lost 140 pounds, he was feeling worse, lightheaded thought it was his glucose dropping, wife provides much information, he was still symptomatic when wife got there 20 minutes later, but he was unresponsive in the yard, laid out in the yard, eyes closed, no abnormal movements, was able to hear everyone talking and then went out again Jared Wood remembers "bits and pieces".    Reviewed notes, labs and imaging from outside physicians, which showed:   MRI brain showed No acute intracranial abnormalities including mass lesion or mass effect, hydrocephalus, extra-axial fluid collection, midline shift, hemorrhage, or acute infarction, large ischemic events (personally reviewed images)   CMP elevated glucose otherwise unremarkable  REVIEW OF SYSTEMS: Out of a complete 14 system review of symptoms, the Jared Wood complains only of the following symptoms, and all other reviewed systems are negative.  ALLERGIES: Allergies  Allergen Reactions   Meperidine Anaphylaxis and Other (See Comments)    Confusion and behavioral changes (makes Jared Wood things negative things)     Latex Rash   Tape Rash and Other (See Comments)    Can stay on for ONLY a limited length of time    HOME MEDICATIONS: Outpatient Medications Prior to Visit  Medication Sig Dispense Refill   AJOVY 225 MG/1.5ML SOAJ Inject 1.5 mLs into the skin every 30 (thirty) days. 1.5 mL 6   ARIPiprazole (ABILIFY) 5 MG tablet Take 5 mg by mouth daily.     aspirin 325 MG EC tablet Take 1 tablet by mouth daily.     clopidogrel (PLAVIX) 75 MG tablet Take 75 mg by mouth daily.     dapagliflozin propanediol (FARXIGA) 10 MG TABS tablet Take 10 mg by mouth daily.     dicyclomine (BENTYL) 10 MG capsule Take 10 mg by mouth in the morning and at bedtime.     divalproex (DEPAKOTE ER) 500 MG 24 hr tablet Take 1 tablet by mouth daily.     escitalopram (LEXAPRO) 20 MG tablet Take 20 mg by mouth daily.      fenofibrate (TRICOR) 145 MG tablet Take 145 mg by mouth daily.     folic acid (FOLVITE) 800 MCG tablet Take 800 mcg by mouth daily with breakfast.      gabapentin (NEURONTIN) 400 MG capsule Take 1 capsule (400 mg total) by mouth 3 (three) times daily. 90 capsule 3   levocetirizine (XYZAL) 5 MG tablet Take 5 mg by mouth at bedtime.     losartan (COZAAR) 25 MG tablet Take 25 mg by mouth daily.     losartan (COZAAR) 50 MG tablet Take by mouth.     meloxicam (MOBIC) 7.5 MG tablet Take 1 tablet (7.5 mg total) by mouth daily. 30 tablet 1   methocarbamol (ROBAXIN) 500 MG tablet Take 1 tablet (500 mg total) by mouth every 8 (eight) hours as needed for muscle spasms. 90 tablet 2   Multiple Vitamins-Minerals (CENTRUM MEN) TABS Take 1 tablet by mouth  daily with breakfast.     nitroGLYCERIN (NITROSTAT) 0.4 MG SL tablet Place 0.4 mg under the tongue every 5 (five) minutes as needed for chest pain.     omeprazole (PRILOSEC) 20 MG capsule Take 20 mg by mouth 2 (two) times daily before a meal.     rosuvastatin (CRESTOR) 40 MG tablet Take 40 mg by mouth daily.     Semaglutide, 1 MG/DOSE, (OZEMPIC, 1 MG/DOSE,) 4 MG/3ML SOPN Inject 1 mg into the skin once a week. On "Sunday     topiramate (TOPAMAX) 50 MG tablet Take 50 mg by mouth 2 (two) times daily.     Ubrogepant (UBRELVY) 100 MG TABS Take 100 mg by mouth every 2 (two) hours as needed. Maximum 200mg a day. 16 tablet 11   No facility-administered medications prior to visit.    PAST MEDICAL HISTORY: 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   Depression    Diabetes  mellitus without complication (HCC)    Type 2   Essential hypertension 04/07/2015   GERD (gastroesophageal reflux disease)    Hard of hearing    Headache 06/30/2015   Heart attack (HCC) 05/2022   Heart disease    Hemiplegic migraine    History of bleeding ulcers    History of Clostridium difficile colitis    History of kidney stones    Hyperlipemia    Hypertension    Left-sided weakness    Migraine    Migraine with aura and without status migrainosus, not intractable 06/17/2019   Mixed hyperlipidemia 04/07/2015   Neuropathy    Pain in joint of left shoulder 07/24/2015   PONV (postoperative nausea and vomiting)    Nausea   Primary osteoarthritis of left knee 08/14/2015   S/P total knee replacement 12/01/2015   Skin cancer 2019   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 was given TPA and underwent rehabilitation.   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 Other wise normal colonoscopy   COLONOSCOPY W/ ENDOSCOPIC [place]     CORONARY ANGIOPLASTY  2008   CORONARY ANGIOPLASTY WITH STENT PLACEMENT  05/2022   ESOPHAGOGASTRODUODENOSCOPY  09/20/2011   Mild gastritis. Otherwise normal esophagogastroduodenoscopy   HAND SURGERY Left 2001   hand reconstruction   HERNIA REPAIR     LITHOTRIPSY     SHOULDER ARTHROSCOPY WITH OPEN ROTATOR CUFF REPAIR Right    TONSILLECTOMY  1972   TOTAL KNEE ARTHROPLASTY Right 12/01/2015   Procedure: TOTAL KNEE ARTHROPLASTY;  Surgeon: Dannielle Huh, MD;  Location: [facility] OR;  Service: Orthopedics;   Laterality: Right;    FAMILY HISTORY: Family History  Problem Relation Age  of Onset   Myocarditis Mother    Prostate cancer Father    Liver cancer Father    Kidney cancer Father    Other Father        lymph nodes   COPD Father    Emphysema Father    Congestive Heart Failure Father    High blood pressure Father    Arthritis Father    Migraines Neg Hx     SOCIAL HISTORY: Social History   Socioeconomic History   Marital status: Married    Spouse name: Not on file   Number of children: 3   Years of education: 2 yrs college   Highest education level: Not on file  Occupational History   Not on file  Tobacco Use   Smoking status: Former   Smokeless tobacco: Current    Types: Chew   Tobacco comments:    uses 1-2 cans/day  Vaping Use   Vaping status: Former  Substance and Sexual Activity   Alcohol use: Yes    Comment: occasional   Drug use: No    Comment: quit hard drugs in 1990   Sexual activity: Not on file  Other Topics Concern   Not on file  Social History Narrative   Lives at home with spouse   Caffeine: "not as much as I used to", can drink up to 2-3 cups/day   Social Determinants of Health   Financial Resource Strain: Not on file  Food Insecurity: Low Risk  (09/29/2022)   Received from [facility] visits prior to 10/02/2022.   Food    Within the past 12 months, you worried that your food would run out before you got money to buy more food: Never true    Within the past 12 months, the food you bought just didn't last and you didn't have money to get more: Never true  Transportation Needs: No Transportation Needs (09/29/2022)   Received from [facility] visits prior to 10/02/2022.   Transportation    In the past 12 months, has lack of reliable transportation kept you from medical appointments, meetings, work or from getting things needed for daily living?: No  Physical Activity: Not on file  Stress: Not on file  Social  Connections: Not on file  Intimate Partner Violence: Low Risk  (09/29/2022)   Received from [facility] visits prior to 10/02/2022.   Safety    How often does anyone, including family and friends, physically hurt you?: Never    How often does anyone, including family and friends, insult or talk down to you?: Never    How often does anyone, including family and friends, threaten you with harm?: Never    How often does anyone, including family and friends, scream or curse at you?: Never      PHYSICAL EXAM  There were no vitals filed for this visit.  There is no height or weight on file to calculate BMI.  Generalized: Well developed, in no acute distress   Neurological examination  Mentation: Alert oriented to time, place, history taking. Follows all commands speech and language fluent Cranial nerve II-XII: Pupils were equal round reactive to light. Extraocular movements were full, visual field were full on confrontational test. Facial sensation and strength were normal. Head turning and shoulder shrug  were normal and symmetric. Motor: The motor testing reveals 5 over 5 strength of all 4 extremities. Good symmetric motor tone is noted throughout.  Sensory: Sensory testing  is intact to soft touch on all 4 extremities. No evidence of extinction is noted.  Coordination: Cerebellar testing reveals good finger-nose-finger and heel-to-shin bilaterally.  Gait and station: Gait is normal.    DIAGNOSTIC DATA (LABS, IMAGING, TESTING) - I reviewed Jared Wood records, labs, notes, testing and imaging myself where available.  Lab Results  Component Value Date   WBC 6.6 07/06/2022   HGB 14.0 07/06/2022   HCT 41.6 07/06/2022   MCV 80.8 07/06/2022   PLT 264.0 07/06/2022      Component Value Date/Time   NA 139 07/06/2022 1552   K 3.7 07/06/2022 1552   CL 102 07/06/2022 1552   CO2 29 07/06/2022 1552   GLUCOSE 145 (H) 07/06/2022 1552   BUN 14 07/06/2022 1552   CREATININE  0.90 07/06/2022 1552   CALCIUM 9.8 07/06/2022 1552   PROT 7.6 07/06/2022 1552   ALBUMIN 4.6 07/06/2022 1552   AST 23 07/06/2022 1552   ALT 32 07/06/2022 1552   ALKPHOS 43 07/06/2022 1552   BILITOT 0.3 07/06/2022 1552   GFRNONAA >60 04/21/2021 0455   GFRAA >60 04/03/2019 1838   Lab Results  Component Value Date   CHOL 118 04/21/2021   HDL 27 (L) 04/21/2021   LDLCALC UNABLE TO CALCULATE IF TRIGLYCERIDE OVER 400 mg/dL 16/05/9603   LDLDIRECT 51.6 04/21/2021   TRIG 413 (H) 04/21/2021   CHOLHDL 4.4 04/21/2021   Lab Results  Component Value Date   HGBA1C 7.6 (H) 04/21/2021      ASSESSMENT AND PLAN 57 y.o. year old male  has a past medical history of Alteration of awareness (06/17/2019), Anxiety, Arthritis, Chronic migraine without aura, with intractable migraine, so stated, with status migrainosus (06/17/2019), Chronic pain of both knees (07/24/2015), Conversion disorder, Conversion disorder with abnormal movement, acute episode, without psychological stressor (04/12/2015), Conversion disorder with mixed symptom presentation (06/30/2015), Depression, Diabetes mellitus without complication (HCC), Essential hypertension (04/07/2015), GERD (gastroesophageal reflux disease), Hard of hearing, Headache (06/30/2015), Heart attack (HCC) (05/2022), Heart disease, Hemiplegic migraine, History of bleeding ulcers, History of Clostridium difficile colitis, History of kidney stones, Hyperlipemia, Hypertension, Left-sided weakness, Migraine, Migraine with aura and without status migrainosus, not intractable (06/17/2019), Mixed hyperlipidemia (04/07/2015), Neuropathy, Pain in joint of left shoulder (07/24/2015), PONV (postoperative nausea and vomiting), Primary osteoarthritis of left knee (08/14/2015), S/P total knee replacement (12/01/2015), Skin cancer (2019), Somatization disorder (04/08/2015), Status post total right knee replacement (12/16/2015), Stroke (HCC) (04/2015), and Type 2 DM with diabetic  neuropathy affecting both sides of body (HCC) (04/07/2015). here with:  1.  Migraine headaches  Continue Ajovy monthly injection Continue Ubrelvy as an abortive therapy Follow-up in 1 year or sooner if needed     Butch Penny, MSN, NP-C 04/11/2023, 11:46 AM [facility] [address], [address] [place], [place] [address] [phone]

## 2023-04-12 ENCOUNTER — Ambulatory Visit: Payer: BC Managed Care – PPO | Admitting: Adult Health

## 2023-04-14 ENCOUNTER — Ambulatory Visit: Payer: BC Managed Care – PPO | Admitting: Adult Health

## 2023-04-18 NOTE — Progress Notes (Unsigned)
PATIENT: Jared Wood DOB: 06/15/1966  REASON FOR VISIT: follow up HISTORY FROM: patient PRIMARY NEUROLOGIST: Dr. Frances Furbish   HISTORY OF PRESENT ILLNESS: Today 04/18/23:  Jared Wood is a 57 y.o. male with a history of Migraine headaches. Returns today for follow-up. Remains on Ajovy. Headaches remain under good control. Only headaches a month. [name] or aleve works well to alleviate his migraines.  09/29/21: Jared Wood is a 57 year old male with a history of migraine headaches.  He returns today for follow-up.  He reports that his headaches have been under good control with Ajovy.  He has approximately 2-3 migraines a month.  Typically can take Aleve or Ubrelvy and it does help the headache.  He states that he still gets 2-3 dull headaches a week for most of the time he does not even notice them.  HISTORY (copied from Dr. Trevor Mace note) Doing great on Aimovig. But insurance isn;t paying for it. Giving 6 months of samples, 70mg  so 2 shots a month instead of 1 shot. Will apply for patient assistance and if that doesn;t work withh try Ajovy or [employer] Aimovig tremendously helpful. No significant side effects (discussed the SE of aimovig), it wears off in 3 weeks, so try 70mg  every 2 weeks and we will try to get 70mg  every 2 week from patient assistance.   Interval history 09/24/2019: He has had an exceptional response to Aimovig.  His wife is here today and she says she has noticed a tremendous difference in them.  He has not had significant headaches, although he does still wake up with headaches and we are pending sleep evaluation with Dr. Vickey Huger to evaluate for sleep apnea which has been diagnosed with in the past and given his TIA I think this is important for him to follow through with.  I spoke with Sheena in our sleep team office, she was initially unable to get a release form for his past sleep records I did talk to her today and give her the release, I also provided the phone number for Shaner  directly in case they do not hear from her (his voicemail was full last time we tried to call) and then they will follow-up.  At this point we will keep him on the Aimovig.  The sleep team will call.  We did schedule an EEG, he has been fine recently, his loss of consciousness in the past was not consistent with seizure-like activity, at this point they like to hold off on the EEG but I did state that if this happens again they definitely need to have routine EEG possibly long-term EEG, he still cannot drive for 6 months after loss of consciousness.   HPI:  Jared Wood is a 57 y.o. male here as requested by Abner Greenspan, MD for migraines.  I reviewed notes from Dr. Yetta Flock.  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  Patient had an episode of alteration of mentation, EMS took him to [facility] and he was diagnosed with complicated migraines. However he had non-organic neuro findings.    Started 04/07/2015 he has had these spells. He went to the emergency room, had tpa, left-sided weakness, he still feels it is weak. Ever since then he has headaches. Headaches in the morning, snoring, obesity, has been diagnosed with OSA  in the past not on cpap, excessive daytime somnolence. Hasn't been tested since 2017 at [facility] [place] [facility]. Headache sin the back of the head. Daily. He has migraines, they start on the top of the head, the whole head with presure, light and sound sensitivity, sound is worse, nausea, no vomiting, movement makes it worse, dark room makes it better, last 24 hours. He is taking Tylenol, ibuprofen, also for arthritis, bc powder. He was mowing his lawn a few weeks  ago, he had a headache, worsening, he has lost 140 pounds, he was feeling worse, lightheaded thought it was his glucose dropping, wife provides much information, he was still symptomatic when wife got there 20 minutes later, but he was unresponsive in the yard, laid out in the yard, eyes closed, no abnormal movements, was able to hear everyone talking and then went out again patient remembers "bits and pieces".    Reviewed notes, labs and imaging from outside physicians, which showed:   MRI brain showed No acute intracranial abnormalities including mass lesion or mass effect, hydrocephalus, extra-axial fluid collection, midline shift, hemorrhage, or acute infarction, large ischemic events (personally reviewed images)   CMP elevated glucose otherwise unremarkable  REVIEW OF SYSTEMS: Out of a complete 14 system review of symptoms, the patient complains only of the following symptoms, and all other reviewed systems are negative.  ALLERGIES: Allergies  Allergen Reactions   Meperidine Anaphylaxis and Other (See Comments)    Confusion and behavioral changes (makes patient things negative things)     Latex Rash   Tape Rash and Other (See Comments)    Can stay on for ONLY a limited length of time    HOME MEDICATIONS: Outpatient Medications Prior to Visit  Medication Sig Dispense Refill   AJOVY 225 MG/1.5ML SOAJ Inject 1.5 mLs into the skin every 30 (thirty) days. 1.5 mL 6   ARIPiprazole (ABILIFY) 5 MG tablet Take 5 mg by mouth daily.     aspirin 325 MG EC tablet Take 1 tablet by mouth daily.     clopidogrel (PLAVIX) 75 MG tablet Take 75 mg by mouth daily.     dapagliflozin propanediol (FARXIGA) 10 MG TABS tablet Take 10 mg by mouth daily.     dicyclomine (BENTYL) 10 MG capsule Take 10 mg by mouth in the morning and at bedtime.     divalproex (DEPAKOTE ER) 500 MG 24 hr tablet Take 1 tablet by mouth daily.     escitalopram (LEXAPRO) 20 MG tablet Take 20 mg by mouth daily.     fenofibrate  (TRICOR) 145 MG tablet Take 145 mg by mouth daily.     folic acid (FOLVITE) 800 MCG tablet Take 800 mcg by mouth daily with breakfast.      gabapentin (NEURONTIN) 400 MG capsule Take 1 capsule (400 mg total) by mouth 3 (three) times daily. 90 capsule 3   levocetirizine (XYZAL) 5 MG tablet Take 5 mg by mouth at bedtime.     losartan (COZAAR) 25 MG tablet Take 25 mg by mouth daily.     losartan (COZAAR) 50 MG tablet Take by mouth.     meloxicam (MOBIC) 7.5 MG tablet Take 1 tablet (7.5 mg total) by mouth daily. 30 tablet 1   methocarbamol (ROBAXIN) 500 MG tablet Take 1 tablet (500 mg total) by mouth every 8 (eight) hours as needed for muscle spasms. 90 tablet 2   Multiple Vitamins-Minerals (CENTRUM MEN) TABS Take 1 tablet by mouth daily with breakfast.  nitroGLYCERIN (NITROSTAT) 0.4 MG SL tablet Place 0.4 mg under the tongue every 5 (five) minutes as needed for chest pain.     omeprazole (PRILOSEC) 20 MG capsule Take 20 mg by mouth 2 (two) times daily before a meal.     rosuvastatin (CRESTOR) 40 MG tablet Take 40 mg by mouth daily.     Semaglutide, 1 MG/DOSE, (OZEMPIC, 1 MG/DOSE,) 4 MG/3ML SOPN Inject 1 mg into the skin once a week. On "Sunday     topiramate (TOPAMAX) 50 MG tablet Take 50 mg by mouth 2 (two) times daily.     Ubrogepant (UBRELVY) 100 MG TABS Take 100 mg by mouth every 2 (two) hours as needed. Maximum 200mg a day. 16 tablet 11   No facility-administered medications prior to visit.    PAST MEDICAL HISTORY: 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   Depression    Diabetes mellitus  without complication (HCC)    Type 2   Essential hypertension 04/07/2015   GERD (gastroesophageal reflux disease)    Hard of hearing    Headache 06/30/2015   Heart attack (HCC) 05/2022   Heart disease    Hemiplegic migraine    History of bleeding ulcers    History of Clostridium difficile colitis    History of kidney stones    Hyperlipemia    Hypertension    Left-sided weakness    Migraine    Migraine with aura and without status migrainosus, not intractable 06/17/2019   Mixed hyperlipidemia 04/07/2015   Neuropathy    Pain in joint of left shoulder 07/24/2015   PONV (postoperative nausea and vomiting)    Nausea   Primary osteoarthritis of left knee 08/14/2015   S/P total knee replacement 12/01/2015   Skin cancer 2019   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 was given TPA and underwent rehabilitation.   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 Other wise normal colonoscopy   COLONOSCOPY W/ ENDOSCOPIC [place]     CORONARY ANGIOPLASTY  2008   CORONARY ANGIOPLASTY WITH STENT PLACEMENT  05/2022   ESOPHAGOGASTRODUODENOSCOPY  09/20/2011   Mild gastritis. Otherwise normal esophagogastroduodenoscopy   HAND SURGERY Left 2001   hand reconstruction   HERNIA REPAIR     LITHOTRIPSY     SHOULDER ARTHROSCOPY WITH OPEN ROTATOR CUFF REPAIR Right    TONSILLECTOMY  1972   TOTAL KNEE ARTHROPLASTY Right 12/01/2015   Procedure: TOTAL KNEE ARTHROPLASTY;  Surgeon: Dannielle Huh, MD;  Location: [facility] OR;  Service: Orthopedics;  Laterality:  Right;    FAMILY HISTORY: Family History  Problem Relation Age of Onset   Myocarditis Mother  Prostate cancer Father    Liver cancer Father    Kidney cancer Father    Other Father        lymph nodes   COPD Father    Emphysema Father    Congestive Heart Failure Father    High blood pressure Father    Arthritis Father    Migraines Neg Hx     SOCIAL HISTORY: Social History   Socioeconomic History   Marital status: Married    Spouse name: Not on file   Number of children: 3   Years of education: 2 yrs college   Highest education level: Not on file  Occupational History   Not on file  Tobacco Use   Smoking status: Former   Smokeless tobacco: Current    Types: Chew   Tobacco comments:    uses 1-2 cans/day  Vaping Use   Vaping status: Former  Substance and Sexual Activity   Alcohol use: Yes    Comment: occasional   Drug use: No    Comment: quit hard drugs in 1990   Sexual activity: Not on file  Other Topics Concern   Not on file  Social History Narrative   Lives at home with spouse   Caffeine: "not as much as I used to", can drink up to 2-3 cups/day   Social Determinants of Health   Financial Resource Strain: Not on file  Food Insecurity: Low Risk  (09/29/2022)   [facility]   Hunger Vital Sign    Worried About Running Out of Food in the Last Year: Never true    Within the past 12 months, the food you bought just didn't last and you didn't have money to get more: Not on file  Transportation Needs: No Transportation Needs (09/29/2022)   Received from [facility] visits prior to 10/02/2022., [facility] visits prior to 10/02/2022.   Transportation    In the past 12 months, has lack of reliable transportation kept you from medical appointments, meetings, work or from getting things needed for daily living?: No  Physical Activity: Not on file  Stress: Not on file  Social Connections: Not on  file  Intimate Partner Violence: Low Risk  (09/29/2022)   [facility] prior to 10/02/2022., [facility] visits prior to 10/02/2022.   Safety    How often does anyone, including family and friends, physically hurt you?: Never    How often does anyone, including family and friends, insult or talk down to you?: Never    How often does anyone, including family and friends, threaten you with harm?: Never    How often does anyone, including family and friends, scream or curse at you?: Never      PHYSICAL EXAM  There were no vitals filed for this visit.  There is no height or weight on file to calculate BMI.  Generalized: Well developed, in no acute distress   Neurological examination  Mentation: Alert oriented to time, place, history taking. Follows all commands speech and language fluent Cranial nerve II-XII: Pupils were equal round reactive to light. Extraocular movements were full, visual field were full on confrontational test. Facial sensation and strength were normal. Head turning and shoulder shrug  were normal and symmetric. Motor: The motor testing reveals 5 over 5 strength of all 4 extremities. Good symmetric motor tone is noted throughout.  Sensory: Sensory testing is intact to soft  touch on all 4 extremities. No evidence of extinction is noted.  Coordination: Cerebellar testing reveals good finger-nose-finger and heel-to-shin bilaterally.  Gait and station: Gait is normal.    DIAGNOSTIC DATA (LABS, IMAGING, TESTING) - I reviewed patient records, labs, notes, testing and imaging myself where available.  Lab Results  Component Value Date   WBC 6.6 07/06/2022   HGB 14.0 07/06/2022   HCT 41.6 07/06/2022   MCV 80.8 07/06/2022   PLT 264.0 07/06/2022      Component Value Date/Time   NA 139 07/06/2022 1552   K 3.7 07/06/2022 1552   CL 102 07/06/2022 1552   CO2 29 07/06/2022 1552   GLUCOSE 145 (H) 07/06/2022 1552    BUN 14 07/06/2022 1552   CREATININE 0.90 07/06/2022 1552   CALCIUM 9.8 07/06/2022 1552   PROT 7.6 07/06/2022 1552   ALBUMIN 4.6 07/06/2022 1552   AST 23 07/06/2022 1552   ALT 32 07/06/2022 1552   ALKPHOS 43 07/06/2022 1552   BILITOT 0.3 07/06/2022 1552   GFRNONAA >60 04/21/2021 0455   GFRAA >60 04/03/2019 1838   Lab Results  Component Value Date   CHOL 118 04/21/2021   HDL 27 (L) 04/21/2021   LDLCALC UNABLE TO CALCULATE IF TRIGLYCERIDE OVER 400 mg/dL 40/98/1191   LDLDIRECT 51.6 04/21/2021   TRIG 413 (H) 04/21/2021   CHOLHDL 4.4 04/21/2021   Lab Results  Component Value Date   HGBA1C 7.6 (H) 04/21/2021      ASSESSMENT AND PLAN 57 y.o. year old male  has a past medical history of Alteration of awareness (06/17/2019), Anxiety, Arthritis, Chronic migraine without aura, with intractable migraine, so stated, with status migrainosus (06/17/2019), Chronic pain of both knees (07/24/2015), Conversion disorder, Conversion disorder with abnormal movement, acute episode, without psychological stressor (04/12/2015), Conversion disorder with mixed symptom presentation (06/30/2015), Depression, Diabetes mellitus without complication (HCC), Essential hypertension (04/07/2015), GERD (gastroesophageal reflux disease), Hard of hearing, Headache (06/30/2015), Heart attack (HCC) (05/2022), Heart disease, Hemiplegic migraine, History of bleeding ulcers, History of Clostridium difficile colitis, History of kidney stones, Hyperlipemia, Hypertension, Left-sided weakness, Migraine, Migraine with aura and without status migrainosus, not intractable (06/17/2019), Mixed hyperlipidemia (04/07/2015), Neuropathy, Pain in joint of left shoulder (07/24/2015), PONV (postoperative nausea and vomiting), Primary osteoarthritis of left knee (08/14/2015), S/P total knee replacement (12/01/2015), Skin cancer (2019), Somatization disorder (04/08/2015), Status post total right knee replacement (12/16/2015), Stroke (HCC) (04/2015),  and 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 here with:  1.  Migraine headaches  Continue Ajovy monthly injection Continue Ubrelvy as an abortive therapy Follow-up in 1 year or sooner if needed     Butch Penny, MSN, NP-C 04/18/2023, 3:19 PM [facility] [address], [address] [place], [place] [address] [phone]

## 2023-04-19 ENCOUNTER — Ambulatory Visit (INDEPENDENT_AMBULATORY_CARE_PROVIDER_SITE_OTHER): Payer: BC Managed Care – PPO | Admitting: Adult Health

## 2023-04-19 ENCOUNTER — Encounter: Payer: Self-pay | Admitting: Adult Health

## 2023-04-19 DIAGNOSIS — G43711 Chronic migraine without aura, intractable, with status migrainosus: Secondary | ICD-10-CM

## 2023-04-19 MED ORDER — AJOVY 225 MG/1.5ML ~~LOC~~ SOAJ: 1.5000 mL | SUBCUTANEOUS | 11 refills | Status: DC

## 2023-04-19 MED ORDER — UBRELVY 100 MG PO TABS: 100.0000 mg | ORAL_TABLET | ORAL | 11 refills | Status: DC | PRN

## 2023-04-19 NOTE — Patient Instructions (Signed)
Your Plan:  Continue Jared Wood If your symptoms worsen or you develop new symptoms please let us know.    Thank you for coming to see [place] at [facility]. I hope we have been able to provide you high quality care today.  You may receive a patient satisfaction survey over the next few weeks. We would appreciate your feedback and comments so that we may continue to improve ourselves and the health of our patients.

## 2023-05-04 DIAGNOSIS — L814 Other melanin hyperpigmentation: Secondary | ICD-10-CM | POA: Diagnosis not present

## 2023-05-04 DIAGNOSIS — L0211 Cutaneous abscess of neck: Secondary | ICD-10-CM | POA: Diagnosis not present

## 2023-05-04 DIAGNOSIS — D225 Melanocytic nevi of trunk: Secondary | ICD-10-CM | POA: Diagnosis not present

## 2023-05-04 DIAGNOSIS — L578 Other skin changes due to chronic exposure to nonionizing radiation: Secondary | ICD-10-CM | POA: Diagnosis not present

## 2023-05-04 DIAGNOSIS — C44311 Basal cell carcinoma of skin of nose: Secondary | ICD-10-CM | POA: Diagnosis not present

## 2023-05-04 DIAGNOSIS — D485 Neoplasm of uncertain behavior of skin: Secondary | ICD-10-CM | POA: Diagnosis not present

## 2023-05-08 DIAGNOSIS — G4733 Obstructive sleep apnea (adult) (pediatric): Secondary | ICD-10-CM | POA: Diagnosis not present

## 2023-05-09 ENCOUNTER — Other Ambulatory Visit (HOSPITAL_COMMUNITY): Payer: Self-pay

## 2023-05-09 ENCOUNTER — Telehealth: Payer: Self-pay | Admitting: Pharmacy Technician

## 2023-05-09 NOTE — Telephone Encounter (Signed)
Pharmacy Patient Advocate Encounter   Received notification from CoverMyMeds that prior authorization for Ubrelvy 100MG  tablets is required/requested.   Insurance verification completed.   The patient is insured through [employer] .   Per test claim: PA required; PA started via CoverMyMeds. KEY [id number] . Waiting for clinical questions to populate.

## 2023-05-11 NOTE — Telephone Encounter (Signed)
Clinical questions have been submitted-awaiting determination. 

## 2023-05-13 ENCOUNTER — Other Ambulatory Visit (HOSPITAL_COMMUNITY): Payer: Self-pay

## 2023-05-13 NOTE — Telephone Encounter (Signed)
Pharmacy Patient Advocate Encounter  Received notification from [facility] that Prior Authorization for Ubrelvy 100MG  tablets has been APPROVED from 05/11/2023 to 08/03/2023. Ran test claim, Copay is $0. This test claim was processed through [facility]- copay amounts may vary at other pharmacies due to pharmacy/plan contracts, or as the patient moves through the different stages of their insurance plan.   PA #/Case ID/Reference #: PA Case ID #: [id number]

## 2023-05-17 ENCOUNTER — Ambulatory Visit: Payer: BC Managed Care – PPO

## 2023-05-17 ENCOUNTER — Other Ambulatory Visit: Payer: Self-pay

## 2023-05-17 ENCOUNTER — Telehealth: Payer: Self-pay

## 2023-05-17 VITALS — BP 120/62 | HR 66 | Temp 97.7°F | Resp 16 | Ht 69.0 in | Wt 261.0 lb

## 2023-05-17 DIAGNOSIS — E785 Hyperlipidemia, unspecified: Secondary | ICD-10-CM | POA: Diagnosis not present

## 2023-05-17 DIAGNOSIS — E782 Mixed hyperlipidemia: Secondary | ICD-10-CM | POA: Diagnosis not present

## 2023-05-17 DIAGNOSIS — E118 Type 2 diabetes mellitus with unspecified complications: Secondary | ICD-10-CM | POA: Diagnosis not present

## 2023-05-17 DIAGNOSIS — Z8042 Family history of malignant neoplasm of prostate: Secondary | ICD-10-CM

## 2023-05-17 DIAGNOSIS — E1159 Type 2 diabetes mellitus with other circulatory complications: Secondary | ICD-10-CM | POA: Diagnosis not present

## 2023-05-17 DIAGNOSIS — E1169 Type 2 diabetes mellitus with other specified complication: Secondary | ICD-10-CM

## 2023-05-17 DIAGNOSIS — I2510 Atherosclerotic heart disease of native coronary artery without angina pectoris: Secondary | ICD-10-CM

## 2023-05-17 DIAGNOSIS — E66812 Obesity, class 2: Secondary | ICD-10-CM | POA: Insufficient documentation

## 2023-05-17 DIAGNOSIS — I152 Hypertension secondary to endocrine disorders: Secondary | ICD-10-CM | POA: Diagnosis not present

## 2023-05-17 DIAGNOSIS — Z6838 Body mass index (BMI) 38.0-38.9, adult: Secondary | ICD-10-CM | POA: Insufficient documentation

## 2023-05-17 DIAGNOSIS — Z6837 Body mass index (BMI) 37.0-37.9, adult: Secondary | ICD-10-CM | POA: Insufficient documentation

## 2023-05-17 NOTE — Progress Notes (Signed)
New Patient Office Visit  Subjective    Patient ID: Jared Wood, male    DOB: 29-Aug-1965  Age: 57 y.o. MRN: [id number]  CC:  Chief Complaint  Patient presents with   Establish Care   new to establish    HPI Jared Wood presents to establish care  Patient has CAD. He had coronary angioplasty in 2008.  "Widow maker heart attack" on 05/23/22, DES to mid LAD.  States he has been doing okay since then. Might have had 1-2 episodes of chest pain with heavy yard work, where he has to sit down Last episode couple months ago.  Cardiology- [facility]. Has appt November 2024.   chronic migraines, follows up with neurology. On Ubrelvy and   Last A1C was 7.9 or 7.6  Last blood work is due soon, is fasting today.  Smoked cigarettes- from age 89 till 33.  Chewing tobacco- started at age 60, quit in May 2023.  STROKE IN 2016, left hemiplegia which improved.    needs PSA as his dad had prostate cancer.      Outpatient Encounter Medications as of 05/17/2023  Medication Sig   ARIPiprazole (ABILIFY) 5 MG tablet Take 5 mg by mouth daily.   aspirin 325 MG EC tablet Take 1 tablet by mouth daily.   clopidogrel (PLAVIX) 75 MG tablet Take 75 mg by mouth daily.   dicyclomine (BENTYL) 10 MG capsule Take 10 mg by mouth in the morning and at bedtime.   escitalopram (LEXAPRO) 20 MG tablet Take 20 mg by mouth daily.   fenofibrate (TRICOR) 145 MG tablet Take 145 mg by mouth daily.   gabapentin (NEURONTIN) 300 MG capsule Take 300 mg by mouth 3 (three) times daily.   levocetirizine (XYZAL) 5 MG tablet Take 5 mg by mouth at bedtime.   loratadine-pseudoephedrine (CLARITIN-D 24-HOUR) 10-240 MG 24 hr tablet Take 1 tablet by mouth daily.   losartan (COZAAR) 25 MG tablet Take 25 mg by mouth daily.   metFORMIN (GLUCOPHAGE) 1000 MG tablet Take 1,000 mg by mouth 2 (two) times daily with a meal.   metoprolol succinate (TOPROL-XL) 25 MG 24 hr tablet Take 25 mg by mouth daily.   Multiple Vitamins-Minerals  (CENTRUM MEN) TABS Take 1 tablet by mouth daily with breakfast.   nabumetone (RELAFEN) 750 MG tablet Take 750 mg by mouth 2 (two) times daily.   nitroGLYCERIN (NITROSTAT) 0.4 MG SL tablet Place 0.4 mg under the tongue every 5 (five) minutes as needed for chest pain.   omeprazole (PRILOSEC) 20 MG capsule Take 20 mg by mouth 2 (two) times daily before a meal.   rosuvastatin (CRESTOR) 40 MG tablet Take 40 mg by mouth daily.   sildenafil (VIAGRA) 50 MG tablet Take 50 mg by mouth daily as needed for erectile dysfunction.   tirzepatide (MOUNJARO) 7.5 MG/0.5ML Pen Inject 7.5 mg into the skin once a week.   topiramate (TOPAMAX) 50 MG tablet Take 50 mg by mouth 2 (two) times daily.   Ubrogepant (UBRELVY) 100 MG TABS Take 1 tablet (100 mg total) by mouth every 2 (two) hours as needed. Maximum 200mg  a day.   [DISCONTINUED] dapagliflozin propanediol (FARXIGA) 10 MG TABS tablet Take 10 mg by mouth daily.   [DISCONTINUED] divalproex (DEPAKOTE ER) 500 MG 24 hr tablet Take 1 tablet by mouth daily.   [DISCONTINUED] folic acid (FOLVITE) 800 MCG tablet Take 800 mcg by mouth daily with breakfast.    [DISCONTINUED] Fremanezumab-vfrm (AJOVY) 225 MG/1.5ML SOAJ Inject 1.5 mLs into the skin  every 30 (thirty) days.   [DISCONTINUED] gabapentin (NEURONTIN) 400 MG capsule Take 1 capsule (400 mg total) by mouth 3 (three) times daily.   [DISCONTINUED] losartan (COZAAR) 50 MG tablet Take by mouth.   [DISCONTINUED] meloxicam (MOBIC) 7.5 MG tablet Take 1 tablet (7.5 mg total) by mouth daily.   [DISCONTINUED] methocarbamol (ROBAXIN) 500 MG tablet Take 1 tablet (500 mg total) by mouth every 8 (eight) hours as needed for muscle spasms.   [DISCONTINUED] Semaglutide, 1 MG/DOSE, (OZEMPIC, 1 MG/DOSE,) 4 MG/3ML SOPN Inject 1 mg into the skin once a week. On "Sunday   No facility-administered encounter medications on file as of 05/17/2023.    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   Depression    Diabetes mellitus without complication (HCC)    Type 2   Essential hypertension 04/07/2015   GERD (gastroesophageal reflux disease)    Hard of hearing    Headache 06/30/2015   Heart attack (HCC) 05/2022   Heart disease    Hemiplegic migraine    History of bleeding ulcers    History of Clostridium difficile colitis    History of kidney stones    Hyperlipemia    Hypertension    Left-sided weakness    Migraine    Migraine with aura and without status migrainosus, not intractable 06/17/2019   Mixed hyperlipidemia 04/07/2015   Neuropathy    Pain in joint of left shoulder 07/24/2015   PONV (postoperative nausea and vomiting)    Nausea   Primary osteoarthritis of left knee 08/14/2015   S/P total knee replacement 12/01/2015   Skin cancer 2019   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 was given TPA and underwent rehabilitation.   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 (HCC) 04/07/2015    Past Surgical History:  Procedure Laterality Date   ADENOIDECTOMY     APPENDECTOMY  2002   CHOLECYSTECTOMY     20" 03 or 2004   COLONOSCOPY  01/17/2017   Small internal hemorrhoids. Other wise normal colonoscopy   COLONOSCOPY W/ ENDOSCOPIC [place]     CORONARY ANGIOPLASTY  2008   CORONARY ANGIOPLASTY WITH STENT PLACEMENT  05/2022   ESOPHAGOGASTRODUODENOSCOPY  09/20/2011   Mild gastritis. Otherwise  normal esophagogastroduodenoscopy   HAND SURGERY Left 2001   hand reconstruction   HERNIA REPAIR     LITHOTRIPSY     SHOULDER ARTHROSCOPY WITH OPEN ROTATOR CUFF REPAIR Right    TONSILLECTOMY  1972   TOTAL KNEE ARTHROPLASTY Right 12/01/2015   Procedure: TOTAL KNEE ARTHROPLASTY;  Surgeon: Dannielle Huh, MD;  Location: [facility] OR;  Service: Orthopedics;  Laterality: Right;    Family History  Problem Relation Age of Onset   Myocarditis Mother    Prostate cancer Father    Liver cancer Father    Kidney cancer Father    Other Father        lymph nodes  COPD Father    Emphysema Father    Congestive Heart Failure Father    High blood pressure Father    Arthritis Father    Migraines Neg Hx     Social History   Socioeconomic History   Marital status: Married    Spouse name: Not on file   Number of children: 3   Years of education: 2 yrs college   Highest education level: Not on file  Occupational History   Not on file  Tobacco Use   Smoking status: Former   Smokeless tobacco: Former    Types: Chew    Quit date: 11/2021   Tobacco comments:    uses 1-2 cans/day  Vaping Use   Vaping status: Former  Substance and Sexual Activity   Alcohol use: Yes    Comment: occasional   Drug use: No    Comment: quit hard drugs in 1990   Sexual activity: Not on file  Other Topics Concern   Not on file  Social History Narrative   Lives at home with spouse   Caffeine: "not as much as I used to", can drink up to 2-3 cups/day   Social Determinants of Health   Financial Resource Strain: Not on file  Food Insecurity: Low Risk  (09/29/2022)   [facility]   Hunger Vital Sign    Worried About Running Out of Food in the Last Year: Never true    Within the past 12 months, the food you bought just didn't last and you didn't have money to get more: Not on file  Transportation Needs: No Transportation Needs (09/29/2022)   Received from [facility]  visits prior to 10/02/2022., [facility] visits prior to 10/02/2022.   Transportation    In the past 12 months, has lack of reliable transportation kept you from medical appointments, meetings, work or from getting things needed for daily living?: No  Physical Activity: Not on file  Stress: Not on file  Social Connections: Not on file  Intimate Partner Violence: Low Risk  (09/29/2022)   [facility] from [facility] visits prior to 10/02/2022., [facility] visits prior to 10/02/2022.   Safety    How often does anyone, including family and friends, physically hurt you?: Never    How often does anyone, including family and friends, insult or talk down to you?: Never    How often does anyone, including family and friends, threaten you with harm?: Never    How often does anyone, including family and friends, scream or curse at you?: Never    Review of Systems  Constitutional:  Negative for fever and malaise/fatigue.  HENT:  Negative for congestion, ear pain and sore throat.   Respiratory:  Negative for cough and shortness of breath.   Cardiovascular:  Negative for chest pain (occasional) and leg swelling.  Gastrointestinal:  Negative for abdominal pain, constipation and diarrhea.  Genitourinary:  Negative for dysuria.  Musculoskeletal:  Positive for joint pain. Negative for back pain and myalgias.  Neurological:  Positive for tingling and sensory change. Negative for dizziness, weakness and headaches.  Psychiatric/Behavioral:  Negative for depression. The patient is not nervous/anxious.         Objective    BP 120/62 (BP Location: Right Arm, Patient Position: Sitting, Cuff Size: Large)   Pulse 66   Temp 97.7 F (36.5 C) (Temporal)   Resp 16   Ht 5\' 9"  (  1.753 m)   Wt 261 lb (118.4 kg)   SpO2 97%   BMI 38.54 kg/m   Physical Exam Vitals and nursing note reviewed.  Constitutional:      Appearance: He is obese.  HENT:      Head: Normocephalic and atraumatic.     Mouth/Throat:     Mouth: Mucous membranes are moist.     Pharynx: Oropharynx is clear.     Comments: Poor dental hygiene Eyes:     Extraocular Movements: Extraocular movements intact.     Pupils: Pupils are equal, round, and reactive to light.  Cardiovascular:     Rate and Rhythm: Normal rate and regular rhythm.  Pulmonary:     Effort: Pulmonary effort is normal.     Breath sounds: Normal breath sounds.  Musculoskeletal:        General: Normal range of motion.  Skin:    General: Skin is warm and dry.     Findings: Lesion (lesion on nose-erythematous, scaly lesion about 5 mm on the right nasal ala) present.  Neurological:     General: No focal deficit present.     Mental Status: He is alert and oriented to person, place, and time. Mental status is at baseline.  Psychiatric:        Mood and Affect: Mood normal.        Behavior: Behavior normal.         Assessment & Plan:   Problem List Items Addressed This Visit     Coronary artery disease due to type 2 diabetes mellitus (HCC) - Primary     Hx of PTCA w/o stent 2008. NSTEMI 05/24/2022 with DES to LAD on 05/25/2022. Continue aspirin indefinitely. Continue clopidogrel through 05/26/2023 (or perhaps longer d/t hx of CVA). Rosuvastatin, metoprolol, losartan, fenofibrate. EF 60-65%.   Will educate to only take ASPIRIN 81 MG DAILY as I see a 325 mg on his medication list.  He has an appointment with his cardiologist in November.       Relevant Medications   losartan (COZAAR) 25 MG tablet   tirzepatide (MOUNJARO) 7.5 MG/0.5ML Pen   metoprolol succinate (TOPROL-XL) 25 MG 24 hr tablet   metFORMIN (GLUCOPHAGE) 1000 MG tablet   sildenafil (VIAGRA) 50 MG tablet   Mixed hyperlipidemia    History of hyperlipidemia with CAD and type 2 diabetes. Currently on fenofibrate 145 mg daily and Crestor 40 mg daily Will check lipid panel      Relevant Medications   losartan (COZAAR) 25 MG tablet    metoprolol succinate (TOPROL-XL) 25 MG 24 hr tablet   sildenafil (VIAGRA) 50 MG tablet   Other Relevant Orders   Comprehensive metabolic panel   Hemoglobin A1c   Lipid panel   Microalbumin / creatinine urine ratio   PSA   TSH   CBC with Differential/Platelet   Hyperlipidemia associated with type 2 diabetes mellitus (HCC)    As above      Relevant Medications   losartan (COZAAR) 25 MG tablet   tirzepatide (MOUNJARO) 7.5 MG/0.5ML Pen   metoprolol succinate (TOPROL-XL) 25 MG 24 hr tablet   metFORMIN (GLUCOPHAGE) 1000 MG tablet   sildenafil (VIAGRA) 50 MG tablet   Other Relevant Orders   Comprehensive metabolic panel   Hemoglobin A1c   Lipid panel   Microalbumin / creatinine urine ratio   PSA   TSH   CBC with Differential/Platelet   Class 2 severe obesity due to excess calories with serious comorbidity and body mass index (BMI)  of 38.0 to 38.9 in adult [facility])    Has class II obesity with BMI of 38.54. Strongly recommended to work on lower calorie diet, portion control and moving around as tolerated.  He is unfortunately unable to exercise due to his arthralgias but his active generally. He also takes [place] which could help with his weight loss      Relevant Medications   tirzepatide (MOUNJARO) 7.5 MG/0.5ML Pen   metFORMIN (GLUCOPHAGE) 1000 MG tablet   Other Relevant Orders   Comprehensive metabolic panel   Hemoglobin A1c   Lipid panel   Microalbumin / creatinine urine ratio   PSA   TSH   CBC with Differential/Platelet   Family history of prostate cancer in father    He reports family history of prostate cancer in his father.  Ordered screening PSA.      Relevant Orders   Comprehensive metabolic panel   Hemoglobin A1c   Lipid panel   Microalbumin / creatinine urine ratio   PSA   TSH   CBC with Differential/Platelet   Controlled type 2 diabetes mellitus with complication, without long-term current use of insulin (HCC)    Has type 2 diabetes, states it has been  decently controlled.  He is on metformin 1000 mg twice daily and Mounjaro 7.5 mg once weekly. Will check hemoglobin A1c today. Advised to continue to work on healthy low calorie diet and increased physical activity as tolerated He states he is up-to-date with diabetic eye exams, reports no retinopathy. Will do diabetic foot exam next visit. He needs to see a dentist due to poor dental hygiene. Does report neuropathy for which she takes gabapentin 300 mg 3 times daily      Relevant Medications   losartan (COZAAR) 25 MG tablet   tirzepatide (MOUNJARO) 7.5 MG/0.5ML Pen   metFORMIN (GLUCOPHAGE) 1000 MG tablet   Other Relevant Orders   Comprehensive metabolic panel   Hemoglobin A1c   Lipid panel   Microalbumin / creatinine urine ratio   PSA   TSH   CBC with Differential/Platelet    Return in about 3 months (around 08/17/2023) for chronic disease follow up.   Total time spent on today's visit was greater than 45 minutes, including both face-to-face time and nonface-to-face time personally spent on review of chart (labs and imaging), discussing labs and goals, discussing further work-up, treatment options, referrals to specialist if needed, reviewing outside records of pertinent, answering patient's questions, and coordinating care.   [name], MD

## 2023-05-17 NOTE — Assessment & Plan Note (Addendum)
Has type 2 diabetes, states it has been decently controlled.  He is on metformin 1000 mg twice daily and Mounjaro 7.5 mg once weekly. Will check hemoglobin A1c today. Advised to continue to work on healthy low calorie diet and increased physical activity as tolerated He states he is up-to-date with diabetic eye exams, reports no retinopathy. Will do diabetic foot exam next visit. He needs to see a dentist due to poor dental hygiene. Does report neuropathy for which she takes gabapentin 300 mg 3 times daily

## 2023-05-17 NOTE — Assessment & Plan Note (Signed)
As above.

## 2023-05-17 NOTE — Telephone Encounter (Addendum)
Patient made aware, stated he is still taking the Plavix and he think he is taking Aspirin 81, he will check with his wife to make sure. Medication has been changed in chart.  ----- Message from [facility] sent at 05/17/2023 11:13 AM EDT ----- Regarding: regarding his Aspirin dosage Can you please call and inform patient that he is only supposed to be taking ASPIRIN 81 MG DAILY and not the higher dose of 325 mg. He should also be on the PLAVIX 75 MG along with Aspirin until he sees his cardiologist in November.  I reviewed his cardiology note from Feb 2024 and they only mentioned him to take aspirin 81 mg.   PLEASE CHANGE HIS DOSAGE ON CHART without sending it.   Thanks,  [name] MD

## 2023-05-17 NOTE — Assessment & Plan Note (Addendum)
  Hx of PTCA w/o stent 2008. NSTEMI 05/24/2022 with DES to LAD on 05/25/2022. Continue aspirin indefinitely. Continue clopidogrel 75 mg through 05/26/2023 (or perhaps longer d/t hx of CVA). Rosuvastatin 40 mg daily, metoprolol XL 25 mg daily, losartan 25 mg daily, fenofibrate 145 mg daily. . EF 60-65% as of his last echo.  Will educate to only take ASPIRIN 81 MG DAILY as I see a 325 mg on his medication list.  He has an appointment with his cardiologist in November.

## 2023-05-17 NOTE — Patient Instructions (Signed)
Blood work today Continue the current medications Please continue to work on eating better, mindful and healthy and moving around as much as you can Return in 3 months

## 2023-05-17 NOTE — Assessment & Plan Note (Signed)
Has class II obesity with BMI of 38.54. Strongly recommended to work on lower calorie diet, portion control and moving around as tolerated.  He is unfortunately unable to exercise due to his arthralgias but his active generally. He also takes [facility] which could help with his weight loss

## 2023-05-17 NOTE — Assessment & Plan Note (Signed)
History of hyperlipidemia with CAD and type 2 diabetes. Currently on fenofibrate 145 mg daily and Crestor 40 mg daily Will check lipid panel

## 2023-05-17 NOTE — Assessment & Plan Note (Signed)
He reports family history of prostate cancer in his father.  Ordered screening PSA.

## 2023-05-18 LAB — CBC WITH DIFFERENTIAL/PLATELET
Basophils Absolute: 0.1 10*3/uL (ref 0.0–0.2)
Basos: 1 %
EOS (ABSOLUTE): 0.2 10*3/uL (ref 0.0–0.4)
Eos: 3 %
Hematocrit: 40.8 % (ref 37.5–51.0)
Hemoglobin: 13.2 g/dL (ref 13.0–17.7)
Immature Grans (Abs): 0 10*3/uL (ref 0.0–0.1)
Immature Granulocytes: 1 %
Lymphocytes Absolute: 1.9 10*3/uL (ref 0.7–3.1)
Lymphs: 29 %
MCH: 27.7 pg (ref 26.6–33.0)
MCHC: 32.4 g/dL (ref 31.5–35.7)
MCV: 86 fL (ref 79–97)
Monocytes Absolute: 0.4 10*3/uL (ref 0.1–0.9)
Monocytes: 7 %
Neutrophils Absolute: 3.9 10*3/uL (ref 1.4–7.0)
Neutrophils: 59 %
Platelets: 234 10*3/uL (ref 150–450)
RBC: 4.77 x10E6/uL (ref 4.14–5.80)
RDW: 13.5 % (ref 11.6–15.4)
WBC: 6.6 10*3/uL (ref 3.4–10.8)

## 2023-05-18 LAB — LIPID PANEL
Chol/HDL Ratio: 3.7 {ratio} (ref 0.0–5.0)
Cholesterol, Total: 133 mg/dL (ref 100–199)
HDL: 36 mg/dL — ABNORMAL LOW (ref >39)
LDL Chol Calc (NIH): 48 mg/dL (ref 0–99)
Triglycerides: 326 mg/dL — ABNORMAL HIGH (ref 0–149)
VLDL Cholesterol Cal: 49 mg/dL — ABNORMAL HIGH (ref 5–40)

## 2023-05-18 LAB — HEMOGLOBIN A1C
Est. average glucose Bld gHb Est-mCnc: 197 mg/dL
Hgb A1c MFr Bld: 8.5 % — ABNORMAL HIGH (ref 4.8–5.6)

## 2023-05-18 LAB — COMPREHENSIVE METABOLIC PANEL
ALT: 34 [IU]/L (ref 0–44)
AST: 26 [IU]/L (ref 0–40)
Albumin: 4.6 g/dL (ref 3.8–4.9)
Alkaline Phosphatase: 56 [IU]/L (ref 44–121)
BUN/Creatinine Ratio: 19 (ref 9–20)
BUN: 18 mg/dL (ref 6–24)
Bilirubin Total: 0.2 mg/dL (ref 0.0–1.2)
CO2: 22 mmol/L (ref 20–29)
Calcium: 9.9 mg/dL (ref 8.7–10.2)
Chloride: 103 mmol/L (ref 96–106)
Creatinine, Ser: 0.95 mg/dL (ref 0.76–1.27)
Globulin, Total: 2.5 g/dL (ref 1.5–4.5)
Glucose: 206 mg/dL — ABNORMAL HIGH (ref 70–99)
Potassium: 4.6 mmol/L (ref 3.5–5.2)
Sodium: 140 mmol/L (ref 134–144)
Total Protein: 7.1 g/dL (ref 6.0–8.5)
eGFR: 94 mL/min/{1.73_m2} (ref >59)

## 2023-05-18 LAB — TSH: TSH: 2.05 u[IU]/mL (ref 0.450–4.500)

## 2023-05-18 LAB — MICROALBUMIN / CREATININE URINE RATIO
Creatinine, Urine: 53.6 mg/dL
Microalb/Creat Ratio: <6 mg/g{creat} (ref 0–29)
Microalbumin, Urine: <3 ug/mL

## 2023-05-18 LAB — PSA: Prostate Specific Ag, Serum: 1.1 ng/mL (ref 0.0–4.0)

## 2023-06-01 DIAGNOSIS — C44311 Basal cell carcinoma of skin of nose: Secondary | ICD-10-CM | POA: Diagnosis not present

## 2023-06-08 DIAGNOSIS — G4733 Obstructive sleep apnea (adult) (pediatric): Secondary | ICD-10-CM | POA: Diagnosis not present

## 2023-06-20 ENCOUNTER — Ambulatory Visit: Payer: BC Managed Care – PPO

## 2023-06-20 VITALS — BP 98/60 | HR 65 | Temp 97.6°F | Resp 16 | Ht 69.0 in | Wt 259.2 lb

## 2023-06-20 DIAGNOSIS — F5104 Psychophysiologic insomnia: Secondary | ICD-10-CM | POA: Diagnosis not present

## 2023-06-20 DIAGNOSIS — G479 Sleep disorder, unspecified: Secondary | ICD-10-CM | POA: Diagnosis not present

## 2023-06-20 DIAGNOSIS — F447 Conversion disorder with mixed symptom presentation: Secondary | ICD-10-CM | POA: Diagnosis not present

## 2023-06-20 DIAGNOSIS — G43711 Chronic migraine without aura, intractable, with status migrainosus: Secondary | ICD-10-CM

## 2023-06-20 MED ORDER — HYDROXYZINE PAMOATE 50 MG PO CAPS: 50.0000 mg | ORAL_CAPSULE | Freq: Every evening | ORAL | 0 refills | Status: DC | PRN

## 2023-06-20 NOTE — Assessment & Plan Note (Addendum)
Difficulty staying asleep for 1-2 weeks, likely exacerbated by stress and marital issues. Current medications (gabapentin, Topamax) may contribute to sedation but are insufficient for sleep. Discussed risks of benzodiazepines (dependency, tolerance).   Hydroxyzine suggested as a safer alternative for as-needed use. Discussed daily-use medications (Belsomra, Sonata). Patient prefers as-needed medication.  - Prescribe hydroxyzine 50 mg, 30 tablets, to be taken at bedtime as needed - Advise increased exercise to manage stress and improve sleep - Consider melatonin if needed - Reassess in two weeks to evaluate hydroxyzine effectiveness

## 2023-06-20 NOTE — Assessment & Plan Note (Signed)
As mentioned above. Will trial hydroxyzine

## 2023-06-20 NOTE — Assessment & Plan Note (Signed)
Managed with Abilify 5 mg daily and Lexapro 20 mg daily, reported as effective. - Continue Abilify 5 mg daily - Continue Lexapro 20 mg daily

## 2023-06-20 NOTE — Patient Instructions (Addendum)
Sent hydroxyzine to your pharmacy  Try exercising more Let me know how you feel in couple weeks

## 2023-06-20 NOTE — Progress Notes (Signed)
Acute Office Visit  Subjective:    Patient ID: Jared Wood, male    DOB: 18-Dec-1965, 57 y.o.   MRN: [id number]  Chief Complaint  Patient presents with   Insomnia   Stress    HPI: Patient is in today for problem visit- presents today wit anxiety, loss of sleep.  The patient, with a history of depression and anxiety, presents with recent sleep disturbances due to increased stress at home. He reports frequent arguments with his spouse, which have been causing significant emotional distress and impacting his sleep. The patient describes difficulty staying asleep, often waking up after only two to three hours. This has been occurring almost nightly for the past two weeks.  The patient's current medication regimen includes Abilify 5mg  daily and Lexapro 20mg  daily for depression and anxiety, Gabapentin 300mg  three times a day, and Topamax 50mg  twice daily for migraines. He also takes Claritin D in the morning. The patient reports that these medications generally manage his depression and anxiety well, but have not been effective in addressing the recent sleep disturbances.  In the past, the patient was prescribed Xanax for similar issues, which he used sparingly. He is seeking a similar as-needed medication to help manage his current sleep disturbances. He expresses a preference for a medication that can be taken as needed, rather than daily, due to the episodic nature of his sleep disturbances.  The patient also mentions that he has been trying to address the root cause of the stress by improving communication with his spouse. He acknowledges the role of his spouse's menopause in the increased tension at home and expresses understanding and empathy towards his spouse's situation. Despite these efforts, the patient continues to experience significant distress and sleep disturbances.   Past Medical History:  Diagnosis Date   Alteration of awareness 06/17/2019   Anxiety    Arthritis    Chronic  migraine without aura, with intractable migraine, so stated, with status migrainosus 06/17/2019   Chronic pain of both knees 07/24/2015   Conversion disorder    Conversion disorder with abnormal movement, acute episode, without psychological stressor 04/12/2015   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   Depression    Diabetes mellitus without complication (HCC)    Type 2   Essential hypertension 04/07/2015   GERD (gastroesophageal reflux disease)    Hard of hearing    Headache 06/30/2015   Heart attack (HCC) 05/2022   Heart disease    Hemiplegic migraine    History of bleeding ulcers    History of Clostridium difficile colitis    History of kidney stones    Hyperlipemia    Hypertension    Left-sided weakness    Migraine    Migraine with aura and without status migrainosus, not intractable 06/17/2019   Mixed hyperlipidemia 04/07/2015   Neuropathy    Pain in joint of left shoulder 07/24/2015   PONV (postoperative nausea and vomiting)    Nausea   Primary osteoarthritis of left knee 08/14/2015   S/P total knee replacement 12/01/2015   Skin cancer 2019   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 was given TPA and underwent rehabilitation.   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 Other wise normal colonoscopy   COLONOSCOPY W/ ENDOSCOPIC [place]     CORONARY ANGIOPLASTY  2008   CORONARY ANGIOPLASTY WITH STENT PLACEMENT  05/2022   ESOPHAGOGASTRODUODENOSCOPY  09/20/2011   Mild gastritis. Otherwise normal  esophagogastroduodenoscopy   HAND SURGERY Left 2001   hand reconstruction   HERNIA REPAIR     LITHOTRIPSY     SHOULDER ARTHROSCOPY WITH OPEN ROTATOR CUFF REPAIR Right    TONSILLECTOMY  1972   TOTAL KNEE ARTHROPLASTY Right 12/01/2015   Procedure: TOTAL KNEE ARTHROPLASTY;  Surgeon: Dannielle Huh, MD;  Location: [facility] OR;  Service: Orthopedics;  Laterality: Right;    Family History  Problem Relation Age of Onset   Myocarditis Mother    Prostate cancer Father    Liver cancer Father    Kidney cancer Father    Other Father        lymph nodes   COPD Father    Emphysema Father    Congestive Heart Failure Father    High blood pressure Father    Arthritis Father    Migraines Neg Hx     Social History   Socioeconomic History   Marital status: Married    Spouse name: Not on file   Number of children: 3   Years of education: 2 yrs college   Highest education level: Not on file  Occupational History   Not on file  Tobacco Use   Smoking status: Former   Smokeless tobacco: Former    Types: Chew    Quit date: 11/2021   Tobacco comments:    uses 1-2 cans/day  Vaping Use   Vaping status: Former  Substance and Sexual Activity   Alcohol use: Yes    Comment: occasional   Drug use: No    Comment: quit hard drugs in 1990   Sexual activity: Not on file  Other Topics Concern   Not on file  Social History Narrative   Lives at home with spouse   Caffeine: "not as much as I used to", can drink up to 2-3 cups/day   Social Determinants of Health   Financial Resource Strain: Not on file  Food Insecurity: Low Risk  (09/29/2022)   [facility]   Hunger Vital Sign    Worried About Running Out of Food in the Last Year: Never true    Within the past 12 months, the food you bought just didn't last and you didn't have money to get more: Not on file  Transportation Needs: No Transportation Needs (09/29/2022)   Received from [facility] visits  prior to 10/02/2022., [facility] visits prior to 10/02/2022.   Transportation    In the past 12 months, has lack of reliable transportation kept you from medical appointments, meetings, work or from getting things needed for daily living?: No  Physical Activity: Not on file  Stress: Not on file  Social Connections: Not on file  Intimate Partner Violence: Low Risk  (09/29/2022)   [facility] visits prior to 10/02/2022., [facility] visits prior to 10/02/2022.   Safety    How often does anyone, including family and friends, physically hurt you?: Never    How often does anyone, including family and  friends, insult or talk down to you?: Never    How often does anyone, including family and friends, threaten you with harm?: Never    How often does anyone, including family and friends, scream or curse at you?: Never    Outpatient Medications Prior to Visit  Medication Sig Dispense Refill   ARIPiprazole (ABILIFY) 5 MG tablet Take 5 mg by mouth daily.     aspirin EC 81 MG tablet Take 81 mg by mouth once.     clopidogrel (PLAVIX) 75 MG tablet Take 75 mg by mouth daily.     dicyclomine (BENTYL) 10 MG capsule Take 10 mg by mouth in the morning and at bedtime.     escitalopram (LEXAPRO) 20 MG tablet Take 20 mg by mouth daily.     fenofibrate (TRICOR) 145 MG tablet Take 145 mg by mouth daily.     gabapentin (NEURONTIN) 300 MG capsule Take 300 mg by mouth 3 (three) times daily.     loratadine-pseudoephedrine (CLARITIN-D 24-HOUR) 10-240 MG 24 hr tablet Take 1 tablet by mouth daily.     losartan (COZAAR) 25 MG tablet Take 25 mg by mouth daily.     metFORMIN (GLUCOPHAGE) 1000 MG tablet Take 1,000 mg by mouth 2 (two) times daily with a meal.     metoprolol succinate (TOPROL-XL) 25 MG 24 hr tablet Take 25 mg by mouth daily.     Multiple Vitamins-Minerals (CENTRUM MEN) TABS Take 1 tablet by mouth daily with breakfast.     nabumetone  (RELAFEN) 750 MG tablet Take 750 mg by mouth 2 (two) times daily.     nitroGLYCERIN (NITROSTAT) 0.4 MG SL tablet Place 0.4 mg under the tongue every 5 (five) minutes as needed for chest pain.     omeprazole (PRILOSEC) 20 MG capsule Take 20 mg by mouth 2 (two) times daily before a meal.     rosuvastatin (CRESTOR) 40 MG tablet Take 40 mg by mouth daily.     sildenafil (VIAGRA) 50 MG tablet Take 50 mg by mouth daily as needed for erectile dysfunction.     tirzepatide (MOUNJARO) 7.5 MG/0.5ML Pen Inject 7.5 mg into the skin once a week.     topiramate (TOPAMAX) 50 MG tablet Take 50 mg by mouth 2 (two) times daily.     Ubrogepant (UBRELVY) 100 MG TABS Take 1 tablet (100 mg total) by mouth every 2 (two) hours as needed. Maximum 200mg  a day. 16 tablet 11   levocetirizine (XYZAL) 5 MG tablet Take 5 mg by mouth at bedtime. (Patient not taking: Reported on 06/20/2023)     No facility-administered medications prior to visit.    Allergies  Allergen Reactions   Meperidine Anaphylaxis and Other (See Comments)    Confusion and behavioral changes (makes patient things negative things)     Latex Rash   Tape Rash and Other (See Comments)    Can stay on for ONLY a limited length of time    Review of Systems  Constitutional: Negative.   HENT: Negative.    Eyes: Negative.   Respiratory: Negative.    Cardiovascular: Negative.   Gastrointestinal: Negative.   Endocrine: Negative.   Genitourinary: Negative.   Neurological: Negative.   Psychiatric/Behavioral:  Positive for dysphoric mood and sleep disturbance. The patient is nervous/anxious.        Objective:        06/20/2023    8:50 AM 05/17/2023    8:55 AM 04/19/2023    9:18 AM  Vitals with BMI  Height 5\' 9"  5\' 9"  5\' 9"   Weight 259 lbs 3 oz 261 lbs 256 lbs 13 oz  BMI 38.26 38.53 37.91  Systolic 98 120 100  Diastolic 60 62 66  Pulse 65 66 60    Orthostatic VS for the past 72 hrs (Last 3 readings):  Patient Position BP Location Cuff Size   06/20/23 0850 Sitting Left Arm Large     Physical Exam Vitals reviewed.  Constitutional:      Appearance: Normal appearance. He is obese.  HENT:     Head: Normocephalic and atraumatic.  Cardiovascular:     Rate and Rhythm: Normal rate and regular rhythm.  Pulmonary:     Effort: Pulmonary effort is normal.     Breath sounds: Normal breath sounds.  Skin:    Comments: Healing surgical lesion on the right nasal ala from recent basal cell carcinoma removal by dermatology  Neurological:     General: No focal deficit present.     Mental Status: He is alert.  Psychiatric:        Mood and Affect: Mood normal.        Behavior: Behavior normal.     Health Maintenance Due  Topic Date Due   COVID-19 Vaccine (1) Never done   FOOT EXAM  Never done   OPHTHALMOLOGY EXAM  Never done   Hepatitis C Screening  Never done   DTaP/Tdap/Td (1 - Tdap) Never done   Zoster Vaccines- Shingrix (1 of 2) Never done   Colonoscopy  Never done   INFLUENZA VACCINE  Never done    There are no preventive care reminders to display for this patient.   Lab Results  Component Value Date   TSH 2.050 05/17/2023   Lab Results  Component Value Date   WBC 6.6 05/17/2023   HGB 13.2 05/17/2023   HCT 40.8 05/17/2023   MCV 86 05/17/2023   PLT 234 05/17/2023   Lab Results  Component Value Date   NA 140 05/17/2023   K 4.6 05/17/2023   CO2 22 05/17/2023   GLUCOSE 206 (H) 05/17/2023   BUN 18 05/17/2023   CREATININE 0.95 05/17/2023   BILITOT 0.2 05/17/2023   ALKPHOS 56 05/17/2023   AST 26 05/17/2023   ALT 34 05/17/2023   PROT 7.1 05/17/2023   ALBUMIN 4.6 05/17/2023   CALCIUM 9.9 05/17/2023   ANIONGAP 8 04/21/2021   EGFR 94 05/17/2023   GFR 95.74 07/06/2022   Lab Results  Component Value Date   CHOL 133 05/17/2023   Lab Results  Component Value Date   HDL 36 (L) 05/17/2023   Lab Results  Component Value Date   LDLCALC 48 05/17/2023   Lab Results  Component Value Date   TRIG 326 (H)  05/17/2023   Lab Results  Component Value Date   CHOLHDL 3.7 05/17/2023   Lab Results  Component Value Date   HGBA1C 8.5 (H) 05/17/2023       Assessment & Plan:  Sleep disturbance Assessment & Plan: Difficulty staying asleep for 1-2 weeks, likely exacerbated by stress and marital issues. Current medications (gabapentin, Topamax) may contribute to sedation but are insufficient for sleep. Discussed risks of benzodiazepines (dependency, tolerance).   Hydroxyzine suggested as a safer alternative for as-needed use. Discussed daily-use medications (Belsomra, Sonata). Patient prefers as-needed medication.  - Prescribe hydroxyzine 50 mg, 30 tablets, to be taken at bedtime as needed - Advise increased exercise to manage stress and improve sleep - Consider melatonin if needed - Reassess in  two weeks to evaluate hydroxyzine effectiveness   Conversion disorder with mixed symptom presentation Assessment & Plan: Managed with Abilify 5 mg daily and Lexapro 20 mg daily, reported as effective. - Continue Abilify 5 mg daily - Continue Lexapro 20 mg daily   Psychophysiological insomnia Assessment & Plan: As mentioned above. Will trial hydroxyzine   Chronic migraine without aura, with intractable migraine, so stated, with status migrainosus Assessment & Plan: Managed with Topamax 50 mg twice daily. - Continue Topamax 50 mg twice daily Continue ubrelvy   Other orders -     hydrOXYzine Pamoate; Take 1 capsule (50 mg total) by mouth at bedtime as needed.  Dispense: 30 capsule; Refill: 0     Meds ordered this encounter  Medications   hydrOXYzine (VISTARIL) 50 MG capsule    Sig: Take 1 capsule (50 mg total) by mouth at bedtime as needed.    Dispense:  30 capsule    Refill:  0    No orders of the defined types were placed in this encounter.    Follow-up: Return if symptoms worsen or fail to improve.  An After Visit Summary was printed and given to the patient.  [name],  MD [facility] [phone]

## 2023-06-20 NOTE — Assessment & Plan Note (Signed)
Managed with Topamax 50 mg twice daily. - Continue Topamax 50 mg twice daily Continue [name]

## 2023-07-05 DIAGNOSIS — I4719 Other supraventricular tachycardia: Secondary | ICD-10-CM | POA: Insufficient documentation

## 2023-07-05 DIAGNOSIS — Z955 Presence of coronary angioplasty implant and graft: Secondary | ICD-10-CM | POA: Diagnosis not present

## 2023-07-05 DIAGNOSIS — R001 Bradycardia, unspecified: Secondary | ICD-10-CM | POA: Diagnosis not present

## 2023-07-05 DIAGNOSIS — I10 Essential (primary) hypertension: Secondary | ICD-10-CM | POA: Diagnosis not present

## 2023-07-05 DIAGNOSIS — E782 Mixed hyperlipidemia: Secondary | ICD-10-CM | POA: Diagnosis not present

## 2023-07-05 DIAGNOSIS — I2510 Atherosclerotic heart disease of native coronary artery without angina pectoris: Secondary | ICD-10-CM | POA: Diagnosis not present

## 2023-07-05 DIAGNOSIS — G4733 Obstructive sleep apnea (adult) (pediatric): Secondary | ICD-10-CM | POA: Diagnosis not present

## 2023-07-08 DIAGNOSIS — G4733 Obstructive sleep apnea (adult) (pediatric): Secondary | ICD-10-CM | POA: Diagnosis not present

## 2023-08-08 DIAGNOSIS — G4733 Obstructive sleep apnea (adult) (pediatric): Secondary | ICD-10-CM | POA: Diagnosis not present

## 2023-08-15 ENCOUNTER — Other Ambulatory Visit: Payer: Self-pay

## 2023-08-15 MED ORDER — DAPAGLIFLOZIN PROPANEDIOL 10 MG PO TABS: 10.0000 mg | ORAL_TABLET | Freq: Every day | ORAL | 1 refills | Status: DC

## 2023-08-15 MED ORDER — ROSUVASTATIN CALCIUM 40 MG PO TABS: 40.0000 mg | ORAL_TABLET | Freq: Every day | ORAL | 1 refills | Status: DC

## 2023-08-15 MED ORDER — DICYCLOMINE HCL 10 MG PO CAPS: 10.0000 mg | ORAL_CAPSULE | Freq: Two times a day (BID) | ORAL | 2 refills | Status: DC

## 2023-08-18 ENCOUNTER — Ambulatory Visit: Payer: BC Managed Care – PPO

## 2023-08-18 VITALS — BP 102/72 | HR 65 | Temp 97.5°F | Ht 69.0 in | Wt 264.4 lb

## 2023-08-18 DIAGNOSIS — E1165 Type 2 diabetes mellitus with hyperglycemia: Secondary | ICD-10-CM

## 2023-08-18 DIAGNOSIS — Z6838 Body mass index (BMI) 38.0-38.9, adult: Secondary | ICD-10-CM | POA: Diagnosis not present

## 2023-08-18 DIAGNOSIS — E782 Mixed hyperlipidemia: Secondary | ICD-10-CM

## 2023-08-18 DIAGNOSIS — F5104 Psychophysiologic insomnia: Secondary | ICD-10-CM

## 2023-08-18 DIAGNOSIS — F418 Other specified anxiety disorders: Secondary | ICD-10-CM

## 2023-08-18 DIAGNOSIS — E66812 Obesity, class 2: Secondary | ICD-10-CM

## 2023-08-18 DIAGNOSIS — Z23 Encounter for immunization: Secondary | ICD-10-CM

## 2023-08-18 MED ORDER — TRAZODONE HCL 50 MG PO TABS: 25.0000 mg | ORAL_TABLET | Freq: Every evening | ORAL | 3 refills | Status: DC | PRN

## 2023-08-18 NOTE — Assessment & Plan Note (Signed)
Last hemoglobin A1c in October was 8.5%, previously 7.6%. On Mounjaro 7.5 mg weekly, metformin 1000 mg twice daily, and Farxiga 10 mg daily. Blood pressure well-controlled with losartan and toprol XL. Emphasized diet and exercise for blood sugar management. - Order blood work for hemoglobin A1c, kidney function, and lipid profile - Continue Mounjaro, metformin, and [place]

## 2023-08-18 NOTE — Assessment & Plan Note (Signed)
Previous labs showed low HDL (36) and high triglycerides (326). LDL was low. Discussed lipid control to prevent cardiovascular events. - Order lipid profile - Continue Crestor 40 mg daily and Tricor 145 mg daily Recommend low calorie diet and increased exercise

## 2023-08-18 NOTE — Progress Notes (Signed)
Subjective:  Patient ID: Jared Wood, male    DOB: [birth year]  Age: 58 y.o. MRN: [id number]  Chief Complaint  Patient presents with   Insomnia    Insomnia Primary symptoms: sleep disturbance.      The patient, with a history of type 2 diabetes, anxiety, and sleep apnea, presents with ongoing insomnia. Despite improvements in his home life and marital situation, he continues to struggle with sleep. He typically goes to bed around 8:30-9:00 PM but often lays awake until 2:00 AM. He has tried adjusting his bedtime without success. Once asleep, he frequently wakes up, often an hour or two after initially falling asleep, and struggles to return to sleep. He denies caffeine intake later in the day and reports regular exercise three times a week. He has previously tried melatonin and hydroxyzine without noticeable improvement in his sleep. He uses a CPAP machine consistently for his sleep apnea.  His diabetes control has been suboptimal with a recent HbA1c of 8.5, up from 7.6. He is currently on a regimen of Mounjaro, metformin, and [place]. He reports trying to eat better and move around more.  He also has a history of skin cancer, with a recent removal of basal cell cancer from the tip of his nose in November. He reports good healing of the surgical site. He has neuropathy in his feet but regularly checks for wounds or skin damage. His blood pressure control is good, with a recent reading of 102/70 on a regimen of losartan and toprol XL. He is also on Crestor and Tricor for cholesterol management. His anxiety is managed with Lexapro and Abilify.      08/18/2023    8:12 AM 05/17/2023    9:14 AM 05/15/2021   12:39 PM 12/09/2020    9:08 AM  Depression screen PHQ 2/9  Decreased Interest  0 0 0  Down, Depressed, Hopeless 0 0 0 0  PHQ - 2 Score 0 0 0 0  Altered sleeping 3 0  1  Tired, decreased energy 2 0  0  Change in appetite 0 0  0  Feeling bad or failure about yourself  0 0  0  Trouble  concentrating 0 0  0  Moving slowly or fidgety/restless 0 0  0  Suicidal thoughts 0 0  0  PHQ-9 Score 5 0  1  Difficult doing work/chores Not difficult at all Not difficult at all  Not difficult at all        08/18/2023    8:12 AM  Fall Risk   Falls in the past year? 1  Number falls in past yr: 0  Injury with Fall? 0  Risk for fall due to : History of fall(s)    Patient Care Team: [name], MD as PCP - General (Family Medicine) Corky Crafts, MD as PCP - Cardiology (Cardiology)   Review of Systems  Constitutional:  Negative for chills, fatigue and fever.  HENT:  Negative for congestion, ear pain, sinus pressure and sore throat.   Respiratory:  Negative for cough.   Cardiovascular:  Negative for chest pain.  Gastrointestinal:  Negative for abdominal pain, constipation, diarrhea, nausea and vomiting.  Genitourinary:  Negative for dysuria and frequency.  Musculoskeletal:  Negative for arthralgias, back pain and myalgias.  Neurological:  Positive for numbness. Negative for dizziness and headaches.  Psychiatric/Behavioral:  Positive for sleep disturbance. Negative for dysphoric mood. The patient has insomnia. The patient is not nervous/anxious.     Current Outpatient Medications on  File Prior to Visit  Medication Sig Dispense Refill   ARIPiprazole (ABILIFY) 5 MG tablet Take 5 mg by mouth daily.     aspirin EC 81 MG tablet Take 81 mg by mouth once.     clopidogrel (PLAVIX) 75 MG tablet Take 75 mg by mouth daily.     dapagliflozin propanediol (FARXIGA) 10 MG TABS tablet Take 1 tablet (10 mg total) by mouth daily. 90 tablet 1   dicyclomine (BENTYL) 10 MG capsule Take 1 capsule (10 mg total) by mouth in the morning and at bedtime. 60 capsule 2   escitalopram (LEXAPRO) 20 MG tablet Take 20 mg by mouth daily.     fenofibrate (TRICOR) 145 MG tablet Take 145 mg by mouth daily.     gabapentin (NEURONTIN) 300 MG capsule Take 300 mg by mouth 3 (three) times daily.      hydrOXYzine (VISTARIL) 50 MG capsule Take 1 capsule (50 mg total) by mouth at bedtime as needed. 30 capsule 0   levocetirizine (XYZAL) 5 MG tablet Take 5 mg by mouth at bedtime.     loratadine-pseudoephedrine (CLARITIN-D 24-HOUR) 10-240 MG 24 hr tablet Take 1 tablet by mouth daily.     losartan (COZAAR) 25 MG tablet Take 25 mg by mouth daily.     metFORMIN (GLUCOPHAGE) 1000 MG tablet Take 1,000 mg by mouth 2 (two) times daily with a meal.     metoprolol succinate (TOPROL-XL) 25 MG 24 hr tablet Take 25 mg by mouth daily.     Multiple Vitamins-Minerals (CENTRUM MEN) TABS Take 1 tablet by mouth daily with breakfast.     nabumetone (RELAFEN) 750 MG tablet Take 750 mg by mouth 2 (two) times daily.     nitroGLYCERIN (NITROSTAT) 0.4 MG SL tablet Place 0.4 mg under the tongue every 5 (five) minutes as needed for chest pain.     omeprazole (PRILOSEC) 20 MG capsule Take 20 mg by mouth 2 (two) times daily before a meal.     rosuvastatin (CRESTOR) 40 MG tablet Take 1 tablet (40 mg total) by mouth daily. 90 tablet 1   sildenafil (VIAGRA) 50 MG tablet Take 50 mg by mouth daily as needed for erectile dysfunction.     tirzepatide (MOUNJARO) 7.5 MG/0.5ML Pen Inject 7.5 mg into the skin once a week.     topiramate (TOPAMAX) 50 MG tablet Take 50 mg by mouth 2 (two) times daily.     Ubrogepant (UBRELVY) 100 MG TABS Take 1 tablet (100 mg total) by mouth every 2 (two) hours as needed. Maximum 200mg  a day. 16 tablet 11   No current facility-administered medications on file prior to visit.   Past Medical History:  Diagnosis Date   Alteration of awareness 06/17/2019   Anxiety    Arthritis    Chronic migraine without aura, with intractable migraine, so stated, with status migrainosus 06/17/2019   Chronic pain of both knees 07/24/2015   Conversion disorder    Conversion disorder with abnormal movement, acute episode, without psychological stressor 04/12/2015   Conversion disorder with mixed symptom presentation  06/30/2015   Depression    Diabetes mellitus without complication (HCC)    Type 2   Essential hypertension 04/07/2015   GERD (gastroesophageal reflux disease)    Hard of hearing    Headache 06/30/2015   Heart attack (HCC) 05/2022   Heart disease    Hemiplegic migraine    History of bleeding ulcers    History of Clostridium difficile colitis    History of kidney  stones    Hyperlipemia    Hypertension    Left-sided weakness    Migraine    Migraine with aura and without status migrainosus, not intractable 06/17/2019   Mixed hyperlipidemia 04/07/2015   Neuropathy    Pain in joint of left shoulder 07/24/2015   PONV (postoperative nausea and vomiting)    Nausea   Primary osteoarthritis of left knee 08/14/2015   S/P total knee replacement 12/01/2015   Skin cancer 2019   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 was given TPA and underwent rehabilitation.   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 Other wise normal colonoscopy   COLONOSCOPY W/ ENDOSCOPIC [place]     CORONARY ANGIOPLASTY  2008   CORONARY ANGIOPLASTY WITH STENT PLACEMENT  05/2022   ESOPHAGOGASTRODUODENOSCOPY  09/20/2011   Mild gastritis. Otherwise normal esophagogastroduodenoscopy   HAND SURGERY Left 2001   hand reconstruction   HERNIA REPAIR     LITHOTRIPSY     SHOULDER ARTHROSCOPY WITH OPEN ROTATOR CUFF REPAIR Right    TONSILLECTOMY  1972   TOTAL KNEE ARTHROPLASTY Right 12/01/2015   Procedure: TOTAL KNEE ARTHROPLASTY;  Surgeon: Dannielle Huh, MD;  Location: [facility] OR;  Service:  Orthopedics;  Laterality: Right;    Family History  Problem Relation Age of Onset   Myocarditis Mother    Prostate cancer Father    Liver cancer Father    Kidney cancer Father    Other Father        lymph nodes   COPD Father    Emphysema Father    Congestive Heart Failure Father    High blood pressure Father    Arthritis Father    Migraines Neg Hx    Social History   Socioeconomic History   Marital status: Married    Spouse name: Not on file   Number of children: 3   Years of education: 2 yrs college   Highest education level: Not on file  Occupational History   Not on file  Tobacco Use   Smoking status: Former   Smokeless tobacco: Former    Types: Chew    Quit date: 11/2021   Tobacco comments:    uses 1-2 cans/day  Vaping Use   Vaping status: Former  Substance and Sexual Activity   Alcohol use: Yes    Comment: occasional   Drug use: No    Comment: quit hard drugs in 1990   Sexual activity: Not on file  Other Topics Concern   Not on file  Social History Narrative   Lives at home with spouse   Caffeine: "not as much as I used to", can drink up to 2-3 cups/day   Social Drivers of Corporate investment banker Strain: Not on file  Food Insecurity: Low Risk  (09/29/2022)   [facility]   Hunger Vital Sign    Worried About Running Out of Food in the Last Year: Never true    Within the past 12 months, the food you  bought just didn't last and you didn't have money to get more: Not on file  Transportation Needs: No Transportation Needs (09/29/2022)   Received from [facility] visits prior to 10/02/2022., [facility] visits prior to 10/02/2022.   Transportation    In the past 12 months, has lack of reliable transportation kept you from medical appointments, meetings, work or from getting things needed for daily living?: No  Physical Activity: Not on file  Stress: Not on file  Social Connections: Not  on file    Objective:  BP 102/72   Pulse 65   Temp (!) 97.5 F (36.4 C)   Ht 5\' 9"  (1.753 m)   Wt 264 lb 6.4 oz (119.9 kg)   SpO2 98%   BMI 39.05 kg/m      08/18/2023    8:10 AM 06/20/2023    8:50 AM 05/17/2023    8:55 AM  BP/Weight  Systolic BP 102 98 120  Diastolic BP 72 60 62  Wt. (Lbs) 264.4 259.2 261  BMI 39.05 kg/m2 38.28 kg/m2 38.54 kg/m2    Physical Exam Vitals and nursing note reviewed.  Constitutional:      Appearance: He is obese.  HENT:     Head: Normocephalic and atraumatic.     Mouth/Throat:     Comments: Poor dentition Eyes:     Pupils: Pupils are equal, round, and reactive to light.  Cardiovascular:     Rate and Rhythm: Normal rate and regular rhythm.  Pulmonary:     Effort: Pulmonary effort is normal.     Breath sounds: Normal breath sounds.  Musculoskeletal:        General: Normal range of motion.     Cervical back: Normal range of motion.  Skin:    General: Skin is warm.  Neurological:     General: No focal deficit present.     Mental Status: He is alert.  Psychiatric:        Mood and Affect: Mood normal.     Diabetic Foot Exam - Simple   No data filed      Lab Results  Component Value Date   WBC 6.6 05/17/2023   HGB 13.2 05/17/2023   HCT 40.8 05/17/2023   PLT 234 05/17/2023   GLUCOSE 206 (H) 05/17/2023   CHOL 133 05/17/2023   TRIG 326 (H) 05/17/2023   HDL 36 (L) 05/17/2023   LDLDIRECT 51.6 04/21/2021   LDLCALC 48 05/17/2023   ALT 34 05/17/2023   AST 26 05/17/2023   NA 140 05/17/2023   K 4.6 05/17/2023   CL 103 05/17/2023   CREATININE 0.95 05/17/2023   BUN 18 05/17/2023   CO2 22 05/17/2023   TSH 2.050 05/17/2023   INR 1.0 07/06/2022   HGBA1C 8.5 (H) 05/17/2023      Assessment & Plan:    Mixed hyperlipidemia Assessment & Plan: Previous labs showed low HDL (36) and high triglycerides (326). LDL was low. Discussed lipid control to prevent cardiovascular events. - Order lipid profile - Continue Crestor 40 mg  daily and Tricor 145 mg daily Recommend low calorie diet and increased exercise  Orders: -     Lipid panel -     Comprehensive metabolic panel -     Hemoglobin A1c  Class 2 severe obesity due to excess calories with serious comorbidity and body mass index (BMI) of 38.0 to 38.9 in adult [facility]) -     Lipid panel -  Comprehensive metabolic panel -     Hemoglobin A1c  Uncontrolled type 2 diabetes mellitus with hyperglycemia (HCC) Assessment & Plan: Last hemoglobin A1c in October was 8.5%, previously 7.6%. On Mounjaro 7.5 mg weekly, metformin 1000 mg twice daily, and Farxiga 10 mg daily. Blood pressure well-controlled with losartan and toprol XL. Emphasized diet and exercise for blood sugar management. - Order blood work for hemoglobin A1c, kidney function, and lipid profile - Continue Mounjaro, metformin, and Farxiga  Orders: -     Lipid panel -     Comprehensive metabolic panel -     Hemoglobin A1c  Need for vaccination -     Pneumococcal conjugate vaccine 20-valent  Psychophysiological insomnia Assessment & Plan: Chronic difficulty falling and staying asleep despite adjustments to sleep schedule and hydroxyzine use. Exercises three times a week and avoids evening caffeine. Melatonin was tried without significant improvement. Consistent CPAP use for sleep apnea. Discussed trazodone risks and benefits, including drowsiness and separation from Lexapro to avoid serotonin syndrome. Trazodone may take about a week to improve sleep cycle if taken daily. - Prescribe trazodone 50 mg as needed, and if ineffective, take daily - Recommend retrying melatonin, starting at 3 mg, 5 mg, or 10 mg - Encourage increased exercise frequency   Depression with anxiety Assessment & Plan: History of Major depressive disorder, recurrent, mild and CHRONIC ANXIETY.   Takes Lexapro mid-morning. Lexapro and trazodone should not be taken together due to serotonin syndrome risk. Explained symptoms of serotonin  syndrome, including jitteriness, shaking, and sweating. - Ensure Lexapro is taken in the morning and trazodone at bedtime, separated by at least 4 hours Continue abilify     General Health Maintenance Has not seen a dentist in the last year or two. Vision issues in the left eye, likely cataract. Pneumonia vaccine due. - Administer pneumonia vaccine - Recommend dental visit within the next year - Monitor vision and consider ophthalmology referral if vision worsens  Follow-up - Review blood work results and adjust treatment as necessary - Follow up in 2-3 weeks to assess trazodone effectiveness and overall health status.   Other orders -     traZODone HCl; Take 0.5-1 tablets (25-50 mg total) by mouth at bedtime as needed for sleep.  Dispense: 30 tablet; Refill: 3     Meds ordered this encounter  Medications   traZODone (DESYREL) 50 MG tablet    Sig: Take 0.5-1 tablets (25-50 mg total) by mouth at bedtime as needed for sleep.    Dispense:  30 tablet    Refill:  3    Advised to separate lexapro and trazodone by 4-6 hours    Orders Placed This Encounter  Procedures   Pneumococcal conjugate vaccine 20-valent (Prevnar 20)   Lipid Panel   Comprehensive metabolic panel   Hemoglobin A1c     Follow-up: Return in about 3 months (around 11/16/2023).    An After Visit Summary was printed and given to the patient.  [name], MD [facility] [phone]

## 2023-08-18 NOTE — Assessment & Plan Note (Addendum)
History of Major depressive disorder, recurrent, mild and CHRONIC ANXIETY.   Takes Lexapro mid-morning. Lexapro and trazodone should not be taken together due to serotonin syndrome risk. Explained symptoms of serotonin syndrome, including jitteriness, shaking, and sweating. - Ensure Lexapro is taken in the morning and trazodone at bedtime, separated by at least 4 hours Continue abilify     General Health Maintenance Has not seen a dentist in the last year or two. Vision issues in the left eye, likely cataract. Pneumonia vaccine due. - Administer pneumonia vaccine - Recommend dental visit within the next year - Monitor vision and consider ophthalmology referral if vision worsens  Follow-up - Review blood work results and adjust treatment as necessary - Follow up in 2-3 weeks to assess trazodone effectiveness and overall health status.

## 2023-08-18 NOTE — Assessment & Plan Note (Signed)
Chronic difficulty falling and staying asleep despite adjustments to sleep schedule and hydroxyzine use. Exercises three times a week and avoids evening caffeine. Melatonin was tried without significant improvement. Consistent CPAP use for sleep apnea. Discussed trazodone risks and benefits, including drowsiness and separation from Lexapro to avoid serotonin syndrome. Trazodone may take about a week to improve sleep cycle if taken daily. - Prescribe trazodone 50 mg as needed, and if ineffective, take daily - Recommend retrying melatonin, starting at 3 mg, 5 mg, or 10 mg - Encourage increased exercise frequency

## 2023-08-18 NOTE — Patient Instructions (Signed)
VISIT SUMMARY:  During today's visit, we discussed your ongoing insomnia, diabetes management, cholesterol levels, anxiety, and recent skin cancer removal. We reviewed your current medications and lifestyle habits, and made some adjustments to help improve your overall health.  YOUR PLAN:  -INSOMNIA: Insomnia is difficulty falling and staying asleep. We discussed starting trazodone 50 mg as needed, and if it doesn't help, taking it daily. You can also retry melatonin, starting at 3 mg, 5 mg, or 10 mg. Increasing your exercise frequency may also help.  -TYPE 2 DIABETES MELLITUS: Type 2 diabetes is a condition where your body doesn't use insulin properly, leading to high blood sugar levels. Your recent HbA1c was 8.5%, up from 7.6%. We will continue your current medications (Mounjaro, metformin, and [place]) and order blood work to check your hemoglobin A1c, kidney function, and lipid profile. Emphasize diet and exercise to help manage your blood sugar.  -HYPERLIPIDEMIA: Hyperlipidemia is having high levels of fats (lipids) in your blood, which can increase the risk of heart disease. Your previous labs showed low HDL and high triglycerides. We will continue your current medications (Crestor and Tricor) and order a lipid profile to monitor your levels.  -ANXIETY: Anxiety is a feeling of worry or fear that can affect daily activities. You are taking Lexapro in the morning. To avoid the risk of serotonin syndrome, ensure Lexapro is taken in the morning and trazodone at bedtime, separated by at least 4 hours.  -BASAL CELL CARCINOMA (POST-REMOVAL): Basal cell carcinoma is a type of skin cancer. You had a lesion removed from your nose in November, and it is healing well. Continue to monitor for any recurrence or new lesions.

## 2023-08-19 LAB — COMPREHENSIVE METABOLIC PANEL
ALT: 30 [IU]/L (ref 0–44)
AST: 27 [IU]/L (ref 0–40)
Albumin: 4.5 g/dL (ref 3.8–4.9)
Alkaline Phosphatase: 65 [IU]/L (ref 44–121)
BUN/Creatinine Ratio: 23 — ABNORMAL HIGH (ref 9–20)
BUN: 19 mg/dL (ref 6–24)
Bilirubin Total: 0.3 mg/dL (ref 0.0–1.2)
CO2: 22 mmol/L (ref 20–29)
Calcium: 9.8 mg/dL (ref 8.7–10.2)
Chloride: 99 mmol/L (ref 96–106)
Creatinine, Ser: 0.82 mg/dL (ref 0.76–1.27)
Globulin, Total: 2.5 g/dL (ref 1.5–4.5)
Glucose: 247 mg/dL — ABNORMAL HIGH (ref 70–99)
Potassium: 4.2 mmol/L (ref 3.5–5.2)
Sodium: 139 mmol/L (ref 134–144)
Total Protein: 7 g/dL (ref 6.0–8.5)
eGFR: 102 mL/min/{1.73_m2} (ref >59)

## 2023-08-19 LAB — HEMOGLOBIN A1C
Est. average glucose Bld gHb Est-mCnc: 275 mg/dL
Hgb A1c MFr Bld: 11.2 % — ABNORMAL HIGH (ref 4.8–5.6)

## 2023-08-19 LAB — LIPID PANEL
Chol/HDL Ratio: 4.8 {ratio} (ref 0.0–5.0)
Cholesterol, Total: 157 mg/dL (ref 100–199)
HDL: 33 mg/dL — ABNORMAL LOW (ref >39)
LDL Chol Calc (NIH): 42 mg/dL (ref 0–99)
Triglycerides: 582 mg/dL (ref 0–149)
VLDL Cholesterol Cal: 82 mg/dL — ABNORMAL HIGH (ref 5–40)

## 2023-08-19 NOTE — Progress Notes (Signed)
I called the patient to discuss his test results.  His diabetes has gotten much worse, his cholesterol has worsened quite a bit, his triglycerides are very elevated.  He states he just got back on the [place].  States he is compliant with all his medications.  Would definitely do better with his diet and physical activity.  Advised to make aggressive lifestyle changes.  Offered referral to dietitian but he declined.  He agreed to make the changes, call us back in 3 months to have blood work repeated.  Thanks, [name] MD

## 2023-09-02 ENCOUNTER — Other Ambulatory Visit (HOSPITAL_COMMUNITY): Payer: Self-pay

## 2023-09-02 ENCOUNTER — Telehealth: Payer: Self-pay | Admitting: Pharmacy Technician

## 2023-09-02 NOTE — Telephone Encounter (Signed)
Pharmacy Patient Advocate Encounter   Received notification from CoverMyMeds that prior authorization for Ubrelvy 100MG  tablets is required/requested.   Insurance verification completed.   The patient is insured through [facility] .   Per test claim: PA required; PA submitted to above mentioned insurance via CoverMyMeds Key/confirmation #/EOC [id number] Status is pending

## 2023-09-07 ENCOUNTER — Other Ambulatory Visit (HOSPITAL_COMMUNITY): Payer: Self-pay

## 2023-09-07 NOTE — Telephone Encounter (Signed)
 Pharmacy Patient Advocate Encounter  Received notification from [facility] that Prior Authorization for UBRELVY  100MG  has been APPROVED from 1.31.25 to 1.31.26. Ran test claim, Copay is $0. This test claim was processed through [facility]- copay amounts may vary at other pharmacies due to pharmacy/plan contracts, or as the patient moves through the different stages of their insurance plan.   PA #/Case ID/Reference #: [id number]

## 2023-09-08 DIAGNOSIS — G4733 Obstructive sleep apnea (adult) (pediatric): Secondary | ICD-10-CM | POA: Diagnosis not present

## 2023-09-26 ENCOUNTER — Ambulatory Visit: Payer: BC Managed Care – PPO

## 2023-09-27 ENCOUNTER — Encounter: Payer: Self-pay | Admitting: Gastroenterology

## 2023-09-29 ENCOUNTER — Other Ambulatory Visit: Payer: Self-pay

## 2023-09-29 DIAGNOSIS — F4320 Adjustment disorder, unspecified: Secondary | ICD-10-CM

## 2023-09-29 DIAGNOSIS — K219 Gastro-esophageal reflux disease without esophagitis: Secondary | ICD-10-CM

## 2023-09-29 DIAGNOSIS — F339 Major depressive disorder, recurrent, unspecified: Secondary | ICD-10-CM

## 2023-09-29 DIAGNOSIS — I69359 Hemiplegia and hemiparesis following cerebral infarction affecting unspecified side: Secondary | ICD-10-CM

## 2023-09-29 DIAGNOSIS — F419 Anxiety disorder, unspecified: Secondary | ICD-10-CM

## 2023-09-29 DIAGNOSIS — E1149 Type 2 diabetes mellitus with other diabetic neurological complication: Secondary | ICD-10-CM

## 2023-09-29 DIAGNOSIS — I10 Essential (primary) hypertension: Secondary | ICD-10-CM

## 2023-10-05 DIAGNOSIS — R5381 Other malaise: Secondary | ICD-10-CM | POA: Diagnosis not present

## 2023-10-05 DIAGNOSIS — E7800 Pure hypercholesterolemia, unspecified: Secondary | ICD-10-CM | POA: Diagnosis not present

## 2023-10-05 DIAGNOSIS — R079 Chest pain, unspecified: Secondary | ICD-10-CM | POA: Diagnosis not present

## 2023-10-05 DIAGNOSIS — R072 Precordial pain: Secondary | ICD-10-CM | POA: Insufficient documentation

## 2023-10-05 DIAGNOSIS — I4719 Other supraventricular tachycardia: Secondary | ICD-10-CM | POA: Diagnosis not present

## 2023-10-05 DIAGNOSIS — I2510 Atherosclerotic heart disease of native coronary artery without angina pectoris: Secondary | ICD-10-CM | POA: Diagnosis not present

## 2023-10-05 DIAGNOSIS — G4733 Obstructive sleep apnea (adult) (pediatric): Secondary | ICD-10-CM | POA: Diagnosis not present

## 2023-10-05 DIAGNOSIS — R5383 Other fatigue: Secondary | ICD-10-CM | POA: Diagnosis not present

## 2023-10-05 DIAGNOSIS — I10 Essential (primary) hypertension: Secondary | ICD-10-CM | POA: Diagnosis not present

## 2023-10-06 DIAGNOSIS — G4733 Obstructive sleep apnea (adult) (pediatric): Secondary | ICD-10-CM | POA: Diagnosis not present

## 2023-10-10 DIAGNOSIS — R0789 Other chest pain: Secondary | ICD-10-CM | POA: Diagnosis not present

## 2023-10-29 ENCOUNTER — Other Ambulatory Visit: Payer: Self-pay

## 2023-10-31 DIAGNOSIS — R079 Chest pain, unspecified: Secondary | ICD-10-CM | POA: Diagnosis not present

## 2023-11-06 DIAGNOSIS — G4733 Obstructive sleep apnea (adult) (pediatric): Secondary | ICD-10-CM | POA: Diagnosis not present

## 2023-11-21 ENCOUNTER — Ambulatory Visit (INDEPENDENT_AMBULATORY_CARE_PROVIDER_SITE_OTHER): Payer: BC Managed Care – PPO

## 2023-11-21 VITALS — BP 102/64 | HR 65 | Temp 97.6°F | Resp 16 | Ht 69.0 in | Wt 256.2 lb

## 2023-11-21 DIAGNOSIS — Z6837 Body mass index (BMI) 37.0-37.9, adult: Secondary | ICD-10-CM

## 2023-11-21 DIAGNOSIS — E782 Mixed hyperlipidemia: Secondary | ICD-10-CM | POA: Diagnosis not present

## 2023-11-21 DIAGNOSIS — E66812 Obesity, class 2: Secondary | ICD-10-CM

## 2023-11-21 DIAGNOSIS — I952 Hypotension due to drugs: Secondary | ICD-10-CM | POA: Diagnosis not present

## 2023-11-21 DIAGNOSIS — Z1159 Encounter for screening for other viral diseases: Secondary | ICD-10-CM | POA: Diagnosis not present

## 2023-11-21 DIAGNOSIS — E1165 Type 2 diabetes mellitus with hyperglycemia: Secondary | ICD-10-CM

## 2023-11-21 DIAGNOSIS — Z1211 Encounter for screening for malignant neoplasm of colon: Secondary | ICD-10-CM | POA: Insufficient documentation

## 2023-11-21 DIAGNOSIS — E6609 Other obesity due to excess calories: Secondary | ICD-10-CM | POA: Insufficient documentation

## 2023-11-21 LAB — HEMOGLOBIN A1C
Est. average glucose Bld gHb Est-mCnc: 206 mg/dL
Hgb A1c MFr Bld: 8.8 % — ABNORMAL HIGH (ref 4.8–5.6)

## 2023-11-21 MED ORDER — TIRZEPATIDE 10 MG/0.5ML ~~LOC~~ SOAJ: 10.0000 mg | SUBCUTANEOUS | 2 refills | Status: DC

## 2023-11-21 NOTE — Assessment & Plan Note (Signed)
 Last A1c was 11.2% in January, indicating poor glycemic control. He has been making lifestyle changes, including fasting, dietary modifications, and exercise, resulting in weight loss. Currently on Farxiga  10 mg daily, Metformin  1000 mg twice daily, and Mounjaro 7.5 mg weekly. Reports no side effects from Mounjaro and has lost weight. - Increase Mounjaro to 10 mg weekly to aid in weight loss and glycemic control - Order A1c test - Encourage increased exercise to 30-40 minutes, 3 times a week or 30 minutes daily

## 2023-11-21 NOTE — Assessment & Plan Note (Signed)
 Losing weight slowly with lifestyle changes, Mounjaro, also on Topamax . Continue meds Increased dose of Mounjaro Recommend increased exercise

## 2023-11-21 NOTE — Patient Instructions (Signed)
 VISIT SUMMARY:  Today, we reviewed your diabetes, hypertension, coronary artery disease, and recent forearm scratch. We discussed your current medications, recent blood work, and lifestyle changes. We also addressed your overdue colonoscopy and general health maintenance.  YOUR PLAN:  -FOREARM SCRATCH: You have a deep scratch on your right forearm from your vaccinated dog. Since you received a tetanus shot on January 19, 2022, no additional tetanus booster is needed.  -TYPE 2 DIABETES MELLITUS: Your last A1c was 11.2% in January, indicating that your blood sugar levels have been high. You have been making positive lifestyle changes, including fasting, dietary modifications, and exercise, which have resulted in weight loss. We will increase your Mounjaro dose to 10 mg weekly to help with weight loss and blood sugar control. We will also order an A1c test and encourage you to increase your exercise to 30-40 minutes, 3 times a week or 30 minutes daily.  -HYPERTENSION: Your blood pressure is currently well-managed with your medications, Losartan  and Metoprolol. You are taking half of a 50 mg Losartan  tablet daily, which has helped reduce dizziness. Continue with your current medication regimen.  -CORONARY ARTERY DISEASE: You had a stent placed on May 23, 2022, and are taking Metoprolol for heart protection. Continue taking Metoprolol as prescribed.  -HYPERLIPIDEMIA: Your cholesterol panel showed elevated triglycerides and low HDL. We are focusing on managing your diabetes and weight loss, which may help improve your cholesterol levels.  -VITAMIN D DEFICIENCY: Your vitamin D level was low in March, and you have been taking vitamin D supplements since then. Continue with your supplementation.  -VITAMIN B12 DEFICIENCY: Your B12 level was low in March, and you have been receiving B12 supplements from your cardiologist. Continue with your supplementation.  -GENERAL HEALTH MAINTENANCE: You are overdue for  a colonoscopy, which was delayed due to your recent stent placement. We will order this with Dr. Lajuan Pila. Please schedule a dental appointment and a diabetic eye exam. We will also order a hepatitis C screening.  INSTRUCTIONS:  Please schedule a follow-up appointment in 3-4 months to monitor your progress and adjust your treatment as needed.

## 2023-11-21 NOTE — Progress Notes (Signed)
 Subjective:  Patient ID: Jared Wood, male    DOB: October 10, 1965  Age: 58 y.o. MRN: [id number]  Chief Complaint  Patient presents with   Medical Management of Chronic Issues    Discussed the use of AI scribe software for clinical note transcription with the patient, who gave verbal consent to proceed.  Discussed the use of AI scribe software for clinical note transcription with the patient, who gave verbal consent to proceed.  History of Present Illness   Jared Wood is a 58 year old male with diabetes and hypertension who presents for follow-up and blood work.  He has a recent deep scratch on his right forearm from playing with his vaccinated dog. He is unsure of his last tetanus shot but was informed it was on January 19, 2022.  He has a history of diabetes with an A1c of 11.2% in January. He manages his diabetes through fasting, dietary changes, and exercises three times a week, including walking and stationary biking for at least 20 minutes each session. He has lost weight from 264 pounds to 256 pounds over three months. His current medications include Farxiga  10 mg daily, Metformin  1000 mg twice daily, and Mounjaro 7.5 mg weekly. No current pain, dizziness, or vision problems. His last diabetic eye exam was last year with no diabetic retinopathy noted.  His blood work from March showed a vitamin D level of 21, for which he received supplementation, and a B12 level of 227, also supplemented by his cardiologist. His cholesterol panel showed a total cholesterol of 120, triglycerides of 340, HDL of 36, and LDL of 48.  He has a history of coronary artery disease with a stent placed on May 23, 2022. He is on Losartan  25 mg daily and Metoprolol XL 25 mg daily for blood pressure management. He reports previous dizziness, which improved after reducing the Losartan  dose by half. His blood pressure readings have been low, with recent measurements of 102/64 and 102/72.  He uses nicotine pouches,  approximately one can per day, to help quit chewing tobacco. He denies smoking cigarettes or using tobacco products.  He is overdue for a colonoscopy, which was delayed due to his recent stent placement. He prefers to have the procedure done by Dr. Lajuan Pila.            08/18/2023    8:12 AM 05/17/2023    9:14 AM 05/15/2021   12:39 PM 12/09/2020    9:08 AM  Depression screen PHQ 2/9  Decreased Interest  0 0 0  Down, Depressed, Hopeless 0 0 0 0  PHQ - 2 Score 0 0 0 0  Altered sleeping 3 0  1  Tired, decreased energy 2 0  0  Change in appetite 0 0  0  Feeling bad or failure about yourself  0 0  0  Trouble concentrating 0 0  0  Moving slowly or fidgety/restless 0 0  0  Suicidal thoughts 0 0  0  PHQ-9 Score 5 0  1  Difficult doing work/chores Not difficult at all Not difficult at all  Not difficult at all        08/18/2023    8:12 AM  Fall Risk   Falls in the past year? 1  Number falls in past yr: 0  Injury with Fall? 0  Risk for fall due to : History of fall(s)    Patient Care Team: Gorgeous Newlun, MD as PCP - General (Family Medicine) Lucendia Rusk, MD as PCP - Cardiology (  Cardiology)   Review of Systems  Constitutional:  Negative for appetite change, fatigue and fever.  HENT:  Negative for congestion, ear pain, sinus pressure and sore throat.   Eyes: Negative.   Respiratory:  Negative for cough, chest tightness, shortness of breath and wheezing.   Cardiovascular:  Negative for chest pain and palpitations.  Gastrointestinal:  Negative for abdominal pain, constipation, diarrhea, nausea and vomiting.  Endocrine: Negative.   Genitourinary:  Negative for dysuria and hematuria.  Musculoskeletal:  Negative for arthralgias, back pain, joint swelling and myalgias.  Skin:  Negative for rash.  Allergic/Immunologic: Negative.   Neurological:  Negative for dizziness, weakness and headaches.  Hematological: Negative.   Psychiatric/Behavioral:  Negative for dysphoric  mood. The patient is not nervous/anxious.     Current Outpatient Medications on File Prior to Visit  Medication Sig Dispense Refill   ARIPiprazole  (ABILIFY ) 5 MG tablet take 1 tab by mouth daily 30 tablet 2   aspirin  EC 81 MG tablet Take 81 mg by mouth once.     clopidogrel  (PLAVIX ) 75 MG tablet 1 tab by mouth daily 30 tablet 2   dapagliflozin  propanediol (FARXIGA ) 10 MG TABS tablet Take 1 tablet (10 mg total) by mouth daily. 90 tablet 1   dicyclomine  (BENTYL ) 10 MG capsule Take 1 capsule (10 mg total) by mouth in the morning and at bedtime. 60 capsule 2   escitalopram  (LEXAPRO ) 20 MG tablet take 1 tab by mouth daily 30 tablet 2   fenofibrate  (TRICOR ) 145 MG tablet Take 1 (one) Tablet by mouth at bedtime 30 tablet 2   furosemide (LASIX) 20 MG tablet Take 20 mg by mouth daily.     gabapentin  (NEURONTIN ) 300 MG capsule Take 1 (one) Capsule by mouth three times daily 270 capsule 2   hydrOXYzine  (VISTARIL ) 50 MG capsule Take 1 capsule (50 mg total) by mouth at bedtime as needed. 30 capsule 0   levocetirizine (XYZAL) 5 MG tablet Take 5 mg by mouth at bedtime.     loratadine-pseudoephedrine (CLARITIN-D 24-HOUR) 10-240 MG 24 hr tablet Take 1 tablet by mouth daily.     losartan  (COZAAR ) 25 MG tablet take 1 tab by mouth daily 30 tablet 2   metFORMIN  (GLUCOPHAGE ) 1000 MG tablet take 1 Tablet by mouth two times daily 60 tablet 2   metoprolol succinate (TOPROL-XL) 25 MG 24 hr tablet take 1 tab by mouth daily 30 tablet 2   Multiple Vitamins-Minerals (CENTRUM MEN) TABS Take 1 tablet by mouth daily with breakfast.     nabumetone (RELAFEN) 750 MG tablet Take 750 mg by mouth 2 (two) times daily.     nitroGLYCERIN  (NITROSTAT ) 0.4 MG SL tablet Place 0.4 mg under the tongue every 5 (five) minutes as needed for chest pain.     omeprazole (PRILOSEC) 20 MG capsule take 1 (one) Capsule by mouth two times daily 180 capsule 2   rosuvastatin  (CRESTOR ) 40 MG tablet Take 1 tablet (40 mg total) by mouth daily. 90 tablet 1    sildenafil (VIAGRA) 50 MG tablet Take 50 mg by mouth daily as needed for erectile dysfunction.     topiramate  (TOPAMAX ) 50 MG tablet take 1 (one) Tablet by mouth two times daily 180 tablet 2   traZODone  (DESYREL ) 50 MG tablet Take 0.5-1 tablets (25-50 mg total) by mouth at bedtime as needed for sleep. 30 tablet 3   Ubrogepant  (UBRELVY ) 100 MG TABS Take 1 tablet (100 mg total) by mouth every 2 (two) hours as needed. Maximum 200mg  a  day. 16 tablet 11   No current facility-administered medications on file prior to visit.   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   Depression    Diabetes mellitus without complication (HCC)    Type 2   Essential hypertension 04/07/2015   GERD (gastroesophageal reflux disease)    Hard of hearing    Headache 06/30/2015   Heart attack (HCC) 05/2022   Heart disease    Hemiplegic migraine    History of bleeding ulcers    History of Clostridium difficile colitis    History of kidney stones    Hyperlipemia    Hypertension    Left-sided weakness    Migraine    Migraine with aura and without status migrainosus, not intractable 06/17/2019   Mixed hyperlipidemia 04/07/2015   Neuropathy    Pain in joint of left shoulder 07/24/2015   PONV (postoperative nausea and vomiting)    Nausea   Primary osteoarthritis of left knee 08/14/2015   S/P total knee replacement 12/01/2015   Skin cancer 2019   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/2015   Overview:  Most likely explanation for her symptoms at admission   Status post total right knee replacement 12/16/2015   Stroke (HCC)  04/2015   pt's wife said they were told by a doctor this may have been conversion disorder. was given TPA and underwent rehabilitation.   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 Other wise normal colonoscopy   COLONOSCOPY W/ ENDOSCOPIC US      CORONARY ANGIOPLASTY  2008   CORONARY ANGIOPLASTY WITH STENT PLACEMENT  05/2022   ESOPHAGOGASTRODUODENOSCOPY  09/20/2011   Mild gastritis. Otherwise normal esophagogastroduodenoscopy   HAND SURGERY Left 2001   hand reconstruction   HERNIA REPAIR     LITHOTRIPSY     SHOULDER ARTHROSCOPY WITH OPEN ROTATOR CUFF REPAIR Right    TONSILLECTOMY  1972   TOTAL KNEE ARTHROPLASTY Right 12/01/2015   Procedure: TOTAL KNEE ARTHROPLASTY;  Surgeon: Christie Cox, MD;  Location: [facility] OR;  Service: Orthopedics;  Laterality: Right;    Family History  Problem Relation Age of Onset   Myocarditis Mother    Prostate cancer Father    Liver cancer Father    Kidney cancer Father    Other Father        lymph nodes   COPD Father    Emphysema Father    Congestive Heart Failure Father    High blood pressure Father    Arthritis Father    Migraines Neg Hx    Social History   Socioeconomic History   Marital status: Married    Spouse name: Not on file   Number of children: 3   Years of education: 2 yrs college   Highest education level: Not on file  Occupational History   Not on file  Tobacco Use   Smoking status: Former   Smokeless tobacco: Former  Types: Cassie Click    Quit date: 11/2021   Tobacco comments:    uses 1-2 cans/day  Vaping Use   Vaping status: Former  Substance and Sexual Activity   Alcohol use: Yes    Comment: occasional   Drug use: No    Comment: quit hard drugs in 1990   Sexual activity: Not on file  Other Topics  Concern   Not on file  Social History Narrative   Lives at home with spouse   Caffeine: "not as much as I used to", can drink up to 2-3 cups/day   Social Drivers of Corporate investment banker Strain: Not on file  Food Insecurity: Low Risk  (09/29/2022)   [facility]   Hunger Vital Sign    Worried About Running Out of Food in the Last Year: Never true    Within the past 12 months, the food you bought just didn't last and you didn't have money to get more: Not on file  Transportation Needs: No Transportation Needs (09/29/2022)   Received from [facility] visits prior to 10/02/2022., [facility] visits prior to 10/02/2022.   Transportation    In the past 12 months, has lack of reliable transportation kept you from medical appointments, meetings, work or from getting things needed for daily living?: No  Physical Activity: Not on file  Stress: Not on file  Social Connections: Not on file    Objective:  BP 102/64   Pulse 65   Temp 97.6 F (36.4 C) (Temporal)   Resp 16   Ht 5\' 9"  (1.753 m)   Wt 256 lb 3.2 oz (116.2 kg)   SpO2 98%   BMI 37.83 kg/m      11/21/2023    8:32 AM 08/18/2023    8:10 AM 06/20/2023    8:50 AM  BP/Weight  Systolic BP 102 102 98  Diastolic BP 64 72 60  Wt. (Lbs) 256.2 264.4 259.2  BMI 37.83 kg/m2 39.05 kg/m2 38.28 kg/m2    Physical Exam Vitals and nursing note reviewed.  Constitutional:      Appearance: He is obese.  HENT:     Head: Normocephalic and atraumatic.     Mouth/Throat:     Comments: Poor dentition Cardiovascular:     Rate and Rhythm: Normal rate and regular rhythm.  Pulmonary:     Effort: Pulmonary effort is normal.     Breath sounds: Normal breath sounds.  Musculoskeletal:        General: Normal range of motion.     Cervical back: Normal range of motion.  Skin:    General: Skin is warm.     Comments: Abrasions on skin from dog scratches  Neurological:      General: No focal deficit present.     Mental Status: He is alert.  Psychiatric:        Mood and Affect: Mood normal.     Diabetic Foot Exam - Simple   No data filed      Lab Results  Component Value Date   WBC 6.6 05/17/2023   HGB 13.2 05/17/2023   HCT 40.8 05/17/2023   PLT 234 05/17/2023   GLUCOSE 247 (H) 08/18/2023   CHOL 157 08/18/2023   TRIG 582 (HH) 08/18/2023   HDL 33 (L) 08/18/2023   LDLDIRECT 51.6 04/21/2021   LDLCALC 42 08/18/2023   ALT 30 08/18/2023   AST 27 08/18/2023   NA 139 08/18/2023  K 4.2 08/18/2023   CL 99 08/18/2023   CREATININE 0.82 08/18/2023   BUN 19 08/18/2023   CO2 22 08/18/2023   TSH 2.050 05/17/2023   INR 1.0 07/06/2022   HGBA1C 11.2 (H) 08/18/2023      Assessment & Plan:   Assessment and Plan    Forearm Scratch Sustained a deep scratch on the right forearm from his vaccinated dog. No additional tetanus booster is needed as he received a tetanus shot on January 19, 2022.  Type 2 Diabetes Mellitus Last A1c was 11.2% in January, indicating poor glycemic control. He has been making lifestyle changes, including fasting, dietary modifications, and exercise, resulting in weight loss. Currently on Farxiga  10 mg daily, Metformin  1000 mg twice daily, and Mounjaro 7.5 mg weekly. Reports no side effects from Mounjaro and has lost weight. - Increase Mounjaro to 10 mg weekly to aid in weight loss and glycemic control - Order A1c test - Encourage increased exercise to 30-40 minutes, 3 times a week or 30 minutes daily  Hypertension On Losartan  and Metoprolol for blood pressure management. Current regimen includes taking half of a 50 mg Losartan  tablet daily, as recommended by the cardiologist, due to previous dizziness. Blood pressure is currently on the lower side, but he reports no dizziness since the adjustment. Medications also provide renal protection and cardiac benefits. - Continue current regimen of half a 50 mg Losartan  tablet daily - Continue  Metoprolol as prescribed  Coronary Artery Disease Had a stent placed on May 23, 2022, and is on Metoprolol for cardiac protection. - Continue Metoprolol as prescribed  Hyperlipidemia Cholesterol panel from March 5th showed elevated triglycerides at 340 mg/dL, low HDL at 36 mg/dL, and LDL at 48 mg/dL. Total cholesterol was 120 mg/dL. Current focus is on managing diabetes and weight loss, which may positively impact lipid levels.  Vitamin D Deficiency Had a low vitamin D level of 21 ng/mL in March and has been on vitamin D supplementation since then.  Vitamin B12 Deficiency B12 level was on the lower side at 227 pg/mL in March, and he has been receiving B12 supplementation from the cardiologist.  General Health Maintenance Overdue for a colonoscopy, which was delayed due to recent stent placement. Has not seen a dentist recently and uses nicotine pouches, which may increase the risk of oral cancer. Advised to maintain regular eye exams and dental visits. Hepatitis C screening is also recommended. - Order colonoscopy with Dr. Lajuan Pila at [facility] - Encourage scheduling a dental appointment - Encourage scheduling a diabetic eye exam - Order hepatitis C screening  Follow-up Advised to return for follow-up in 3-4 months to monitor progress and adjust treatment as needed. - Schedule follow-up appointment in 3-4 months          Follow-up: Return for chronic disease follow up. An After Visit Summary was printed and given to the patient.  Crystie Yanko, MD [facility] [phone]

## 2023-11-21 NOTE — Assessment & Plan Note (Signed)
 Hyperlipidemia Cholesterol panel from March 5th showed elevated triglycerides at 340 mg/dL, low HDL at 36 mg/dL, and LDL at 48 mg/dL. Total cholesterol was 120 mg/dL. Current focus is on managing diabetes and weight loss, which may positively impact lipid levels. He is on tricor  145 mg, crestor  40 mg Will defer lipid panel as he just had it in march 2025

## 2023-11-21 NOTE — Assessment & Plan Note (Signed)
 On Losartan  and Metoprolol for blood pressure management. Current regimen includes taking half of a 50 mg Losartan  tablet daily, as recommended by the cardiologist, due to previous dizziness. Blood pressure is currently on the lower side, but he reports no dizziness since the adjustment. Medications also provide renal protection and cardiac benefits. - Continue current regimen of half a 50 mg Losartan  tablet daily and TOPROL XL 25 MG daily - Continue Metoprolol as prescribed

## 2023-11-21 NOTE — Assessment & Plan Note (Signed)
 General Health Maintenance Overdue for a colonoscopy, which was delayed due to recent stent placement. Has not seen a dentist recently and uses nicotine pouches, which may increase the risk of oral cancer. Advised to maintain regular eye exams and dental visits. Hepatitis C screening is also recommended. - Order colonoscopy with Dr. Lajuan Pila at [facility] - Encourage scheduling a dental appointment - Encourage scheduling a diabetic eye exam - Order hepatitis C screening

## 2023-11-22 LAB — HEPATITIS C ANTIBODY: Hep C Virus Ab: NONREACTIVE

## 2023-12-05 DIAGNOSIS — I10 Essential (primary) hypertension: Secondary | ICD-10-CM | POA: Diagnosis not present

## 2023-12-05 DIAGNOSIS — I2510 Atherosclerotic heart disease of native coronary artery without angina pectoris: Secondary | ICD-10-CM | POA: Diagnosis not present

## 2023-12-05 DIAGNOSIS — G4733 Obstructive sleep apnea (adult) (pediatric): Secondary | ICD-10-CM | POA: Diagnosis not present

## 2023-12-05 DIAGNOSIS — I4719 Other supraventricular tachycardia: Secondary | ICD-10-CM | POA: Diagnosis not present

## 2023-12-06 DIAGNOSIS — G4733 Obstructive sleep apnea (adult) (pediatric): Secondary | ICD-10-CM | POA: Diagnosis not present

## 2023-12-23 ENCOUNTER — Ambulatory Visit: Admitting: Gastroenterology

## 2023-12-28 ENCOUNTER — Other Ambulatory Visit: Payer: Self-pay

## 2023-12-28 DIAGNOSIS — F339 Major depressive disorder, recurrent, unspecified: Secondary | ICD-10-CM

## 2023-12-28 DIAGNOSIS — I69359 Hemiplegia and hemiparesis following cerebral infarction affecting unspecified side: Secondary | ICD-10-CM

## 2023-12-28 DIAGNOSIS — F419 Anxiety disorder, unspecified: Secondary | ICD-10-CM

## 2023-12-28 DIAGNOSIS — E1149 Type 2 diabetes mellitus with other diabetic neurological complication: Secondary | ICD-10-CM

## 2023-12-28 DIAGNOSIS — I10 Essential (primary) hypertension: Secondary | ICD-10-CM

## 2024-01-12 NOTE — Progress Notes (Deleted)
 01/12/2024 Quitman Bucy [id number] 14-Jul-1966  Referring provider: Sirivol, Mamatha, MD Primary GI doctor: Dr. Venice Gillis  ASSESSMENT AND PLAN:  IBS-D with postcholecystectomy diarrhea Responds well to Bentyl   Family history of polyps in father less than 60 12/2016 colonoscopy unable to find report, recall 5 years which was delayed due to stent placement 05/2022  GERD/melena 07/06/2022 patient was having complaints of dark stool which had resolved, EGD not performed due to stent placement 05/2022  CAD  (EF 60 to 65%) Status post PCI stent 05/2022 On Plavix   History of CVA  Type 2 diabetes with neuropathy  Patient Care Team: Sirivol, Mamatha, MD as PCP - General (Family Medicine) Lucendia Rusk, MD as PCP - Cardiology (Cardiology)  HISTORY OF PRESENT ILLNESS: 58 y.o. male with a past medical history listed below presents for evaluation of ***.  Last seen in the office 07/06/2022 by Dr. Venice Gillis for GERD/melena and family history of colon cancer at that time patient had just had MI with stenting of the LAD 05/25/2022 and was scheduled for follow-up.  *** Discussed the use of AI scribe software for clinical note transcription with the patient, who gave verbal consent to proceed.  History of Present Illness            He  reports that he has quit smoking. He quit smokeless tobacco use about 2 years ago.  His smokeless tobacco use included chew. He reports current alcohol use. He reports that he does not use drugs.  RELEVANT GI HISTORY, IMAGING AND LABS: Results          CBC    Component Value Date/Time   WBC 6.6 05/17/2023 1010   WBC 6.6 07/06/2022 1552   RBC 4.77 05/17/2023 1010   RBC 5.14 07/06/2022 1552   HGB 13.2 05/17/2023 1010   HCT 40.8 05/17/2023 1010   PLT 234 05/17/2023 1010   MCV 86 05/17/2023 1010   MCH 27.7 05/17/2023 1010   MCH 27.9 04/21/2021 0455   MCHC 32.4 05/17/2023 1010   MCHC 33.7 07/06/2022 1552   RDW 13.5 05/17/2023 1010   LYMPHSABS  1.9 05/17/2023 1010   MONOABS 0.4 07/06/2022 1552   EOSABS 0.2 05/17/2023 1010   BASOSABS 0.1 05/17/2023 1010   Recent Labs    05/17/23 1010  HGB 13.2    CMP     Component Value Date/Time   NA 139 08/18/2023 0849   K 4.2 08/18/2023 0849   CL 99 08/18/2023 0849   CO2 22 08/18/2023 0849   GLUCOSE 247 (H) 08/18/2023 0849   GLUCOSE 145 (H) 07/06/2022 1552   BUN 19 08/18/2023 0849   CREATININE 0.82 08/18/2023 0849   CALCIUM  9.8 08/18/2023 0849   PROT 7.0 08/18/2023 0849   ALBUMIN 4.5 08/18/2023 0849   AST 27 08/18/2023 0849   ALT 30 08/18/2023 0849   ALKPHOS 65 08/18/2023 0849   BILITOT 0.3 08/18/2023 0849   GFRNONAA >60 04/21/2021 0455   GFRAA >60 04/03/2019 1838      Latest Ref Rng & Units 08/18/2023    8:49 AM 05/17/2023   10:10 AM 07/06/2022    3:52 PM  Hepatic Function  Total Protein 6.0 - 8.5 g/dL 7.0  7.1  7.6   Albumin 3.8 - 4.9 g/dL 4.5  4.6  4.6   AST 0 - 40 IU/L 27  26  23    ALT 0 - 44 IU/L 30  34  32   Alk Phosphatase 44 - 121 IU/L 65  56  43   Total Bilirubin 0.0 - 1.2 mg/dL 0.3  0.2  0.3       Current Medications:   Current Outpatient Medications (Endocrine & Metabolic):    dapagliflozin  propanediol (FARXIGA ) 10 MG TABS tablet, Take 1 tablet (10 mg total) by mouth daily.   metFORMIN  (GLUCOPHAGE ) 1000 MG tablet, take 1 Tablet by mouth two times daily   tirzepatide  (MOUNJARO ) 10 MG/0.5ML Pen, Inject 10 mg into the skin once a week.  Current Outpatient Medications (Cardiovascular):    fenofibrate  (TRICOR ) 145 MG tablet, Take 1 (one) Tablet by mouth at bedtime   furosemide (LASIX) 20 MG tablet, Take 20 mg by mouth daily.   losartan  (COZAAR ) 25 MG tablet, take 1 tab by mouth daily   metoprolol succinate (TOPROL-XL) 25 MG 24 hr tablet, take 1 tab by mouth daily   nitroGLYCERIN  (NITROSTAT ) 0.4 MG SL tablet, Place 0.4 mg under the tongue every 5 (five) minutes as needed for chest pain.   rosuvastatin  (CRESTOR ) 40 MG tablet, Take 1 tablet (40 mg total) by  mouth daily.   sildenafil (VIAGRA) 50 MG tablet, Take 50 mg by mouth daily as needed for erectile dysfunction.  Current Outpatient Medications (Respiratory):    levocetirizine (XYZAL) 5 MG tablet, Take 5 mg by mouth at bedtime.   loratadine-pseudoephedrine (CLARITIN-D 24-HOUR) 10-240 MG 24 hr tablet, Take 1 tablet by mouth daily.  Current Outpatient Medications (Analgesics):    aspirin  EC 81 MG tablet, Take 81 mg by mouth once.   nabumetone (RELAFEN) 750 MG tablet, Take 750 mg by mouth 2 (two) times daily.   Ubrogepant  (UBRELVY ) 100 MG TABS, Take 1 tablet (100 mg total) by mouth every 2 (two) hours as needed. Maximum 200mg  a day.  Current Outpatient Medications (Hematological):    clopidogrel  (PLAVIX ) 75 MG tablet, take 1 TABLET by mouth daily]  Current Outpatient Medications (Other):    ARIPiprazole  (ABILIFY ) 5 MG tablet, take 1 tab by mouth daily   dicyclomine  (BENTYL ) 10 MG capsule, Take 1 capsule (10 mg total) by mouth in the morning and at bedtime.   escitalopram  (LEXAPRO ) 20 MG tablet, take 1 tab by mouth daily   gabapentin  (NEURONTIN ) 300 MG capsule, Take 1 (one) Capsule by mouth three times daily   hydrOXYzine  (VISTARIL ) 50 MG capsule, Take 1 capsule (50 mg total) by mouth at bedtime as needed.   Multiple Vitamins-Minerals (CENTRUM MEN) TABS, Take 1 tablet by mouth daily with breakfast.   omeprazole (PRILOSEC) 20 MG capsule, take 1 (one) Capsule by mouth two times daily   topiramate  (TOPAMAX ) 50 MG tablet, take 1 (one) Tablet by mouth two times daily   traZODone  (DESYREL ) 50 MG tablet, Take 0.5-1 tablets (25-50 mg total) by mouth at bedtime as needed for sleep.  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   Depression    Diabetes mellitus without complication (HCC)    Type 2   Essential hypertension 04/07/2015   GERD (gastroesophageal reflux disease)    Hard of hearing    Headache 06/30/2015   Heart attack (HCC) 05/2022   Heart disease  Hemiplegic migraine    History of bleeding ulcers    History of Clostridium difficile colitis    History of kidney stones    Hyperlipemia    Hypertension    Left-sided weakness    Migraine    Migraine with aura and without status migrainosus, not intractable 06/17/2019   Mixed hyperlipidemia 04/07/2015   Neuropathy    Pain in joint of left shoulder 07/24/2015   PONV (postoperative nausea and vomiting)    Nausea   Primary osteoarthritis of left knee 08/14/2015   S/P total knee replacement 12/01/2015   Skin cancer 2019   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 was given TPA and underwent rehabilitation.   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 (HCC) 04/07/2015   Allergies:  Allergies  Allergen Reactions   Meperidine Anaphylaxis and Other (See Comments)    Confusion and behavioral changes (makes patient things negative things)     Latex Rash   Tape Rash and Other (See Comments)    Can stay on for ONLY a limited length of time     Surgical History:  He  has a past surgical history that includes Appendectomy (2002); Cholecystectomy; Shoulder arthroscopy with open rotator cuff repair (Right); Hernia repair; Hand surgery (Left, 2001); Colonoscopy w/ endoscopic US ; Coronary angioplasty (2008); Lithotripsy; Total knee arthroplasty (Right, 12/01/2015); Tonsillectomy  (1972); Adenoidectomy; Colonoscopy (01/17/2017); Esophagogastroduodenoscopy (09/20/2011); and Coronary angioplasty with stent (05/2022). Family History:  His family history includes Arthritis in his father; COPD in his father; Congestive Heart Failure in his father; Emphysema in his father; High blood pressure in his father; Kidney cancer in his father; Liver cancer in his father; Myocarditis in his mother; Other in his father; Prostate cancer in his father.  REVIEW OF SYSTEMS  : All other systems reviewed and negative except where noted in the History of Present Illness.  PHYSICAL EXAM: There were no vitals taken for this visit. Physical Exam          Edmonia Gottron, PA-C 1:46 PM

## 2024-01-16 ENCOUNTER — Ambulatory Visit: Admitting: Physician Assistant

## 2024-01-19 ENCOUNTER — Telehealth: Payer: Self-pay

## 2024-01-19 NOTE — Telephone Encounter (Signed)
 Printed out the results and called patient. I left the results in the front desk.  Copied from CRM [id number]. 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 Callback number is [phone]. Please call when ready for pickup. Pt does not have access to MyChart.

## 2024-01-27 ENCOUNTER — Other Ambulatory Visit: Payer: Self-pay

## 2024-01-29 ENCOUNTER — Other Ambulatory Visit: Payer: Self-pay

## 2024-03-12 DIAGNOSIS — S39012A Strain of muscle, fascia and tendon of lower back, initial encounter: Secondary | ICD-10-CM | POA: Diagnosis not present

## 2024-03-21 ENCOUNTER — Ambulatory Visit (HOSPITAL_BASED_OUTPATIENT_CLINIC_OR_DEPARTMENT_OTHER)
Admission: RE | Admit: 2024-03-21 | Discharge: 2024-03-21 | Disposition: A | Discharge status: Home / Self Care | Source: Ambulatory Visit | Admitting: Radiology

## 2024-03-21 ENCOUNTER — Ambulatory Visit

## 2024-03-21 VITALS — BP 112/68 | HR 64 | Temp 97.4°F | Ht 69.0 in | Wt 240.0 lb

## 2024-03-21 DIAGNOSIS — E7800 Pure hypercholesterolemia, unspecified: Secondary | ICD-10-CM | POA: Insufficient documentation

## 2024-03-21 DIAGNOSIS — E1165 Type 2 diabetes mellitus with hyperglycemia: Secondary | ICD-10-CM

## 2024-03-21 DIAGNOSIS — G8911 Acute pain due to trauma: Secondary | ICD-10-CM | POA: Insufficient documentation

## 2024-03-21 DIAGNOSIS — M19011 Primary osteoarthritis, right shoulder: Secondary | ICD-10-CM | POA: Diagnosis not present

## 2024-03-21 DIAGNOSIS — M778 Other enthesopathies, not elsewhere classified: Secondary | ICD-10-CM | POA: Diagnosis not present

## 2024-03-21 DIAGNOSIS — I639 Cerebral infarction, unspecified: Secondary | ICD-10-CM | POA: Insufficient documentation

## 2024-03-21 DIAGNOSIS — E785 Hyperlipidemia, unspecified: Secondary | ICD-10-CM | POA: Diagnosis not present

## 2024-03-21 DIAGNOSIS — I10 Essential (primary) hypertension: Secondary | ICD-10-CM | POA: Insufficient documentation

## 2024-03-21 DIAGNOSIS — M25511 Pain in right shoulder: Secondary | ICD-10-CM | POA: Diagnosis not present

## 2024-03-21 DIAGNOSIS — R7309 Other abnormal glucose: Secondary | ICD-10-CM | POA: Insufficient documentation

## 2024-03-21 DIAGNOSIS — E66812 Obesity, class 2: Secondary | ICD-10-CM | POA: Diagnosis not present

## 2024-03-21 DIAGNOSIS — Z6835 Body mass index (BMI) 35.0-35.9, adult: Secondary | ICD-10-CM

## 2024-03-21 DIAGNOSIS — M25711 Osteophyte, right shoulder: Secondary | ICD-10-CM | POA: Diagnosis not present

## 2024-03-21 MED ORDER — HYDROCODONE-ACETAMINOPHEN 5-325 MG PO TABS: 1.0000 | ORAL_TABLET | Freq: Two times a day (BID) | ORAL | 0 refills | Status: AC | PRN

## 2024-03-21 NOTE — Assessment & Plan Note (Signed)
 Continue Tricor  145 mg daily and Crestor  40 mg daily. Will repeat blood work

## 2024-03-21 NOTE — Patient Instructions (Addendum)
 Sent few tablets of Hydrocodone  to take up to couple times daily only as needed for severe pain. 2.  If any mild to moderate pain, please take Tylenol . 3.  Ordered x-ray of your right shoulder 4.  Ordered blood work. 5.  Please continue to eat healthy and stay active. 6.  Return in 3 months for follow-up.

## 2024-03-21 NOTE — Assessment & Plan Note (Signed)
 UNCONTROLLED TYPE 2 DIABETES, WITH COMPLICATION OF HYPERGLYCEMIA, WITHOUT LONG TERM USE OF INSULIN .  Type 2 diabetes mellitus managed with Farxiga , metformin , and Mounjaro . Significant weight loss from 264 pounds to 240 pounds since January 2025. Last A1c was 11.2% in January 2025. Blood work is due to assess current status, including A1c and cholesterol levels. - Order blood work to assess A1c and cholesterol levels - Continue current diabetes medications: Farxiga , metformin , and Mounjaro  - Encourage continuation of healthy diet and weight management
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
## 2024-03-21 NOTE — Assessment & Plan Note (Signed)
 Appreciated his 24 pound weight loss in the past 8 months. Advised to continue Mounjaro , healthy diet and increased activity

## 2024-03-22 LAB — LIPID PANEL
Chol/HDL Ratio: 5.8 ratio — ABNORMAL HIGH (ref 0.0–5.0)
Cholesterol, Total: 232 mg/dL — ABNORMAL HIGH (ref 100–199)
HDL: 40 mg/dL (ref >39)
LDL Chol Calc (NIH): 87 mg/dL (ref 0–99)
Triglycerides: 642 mg/dL (ref 0–149)
VLDL Cholesterol Cal: 105 mg/dL — ABNORMAL HIGH (ref 5–40)

## 2024-03-22 LAB — HEMOGLOBIN A1C
Est. average glucose Bld gHb Est-mCnc: 295 mg/dL
Hgb A1c MFr Bld: 11.9 % — ABNORMAL HIGH (ref 4.8–5.6)

## 2024-03-22 LAB — COMPREHENSIVE METABOLIC PANEL WITH GFR
ALT: 27 IU/L (ref 0–44)
AST: 25 IU/L (ref 0–40)
Albumin: 4.4 g/dL (ref 3.8–4.9)
Alkaline Phosphatase: 63 IU/L (ref 44–121)
BUN/Creatinine Ratio: 16 (ref 9–20)
BUN: 15 mg/dL (ref 6–24)
Bilirubin Total: 0.3 mg/dL (ref 0.0–1.2)
CO2: 20 mmol/L (ref 20–29)
Calcium: 9.4 mg/dL (ref 8.7–10.2)
Chloride: 100 mmol/L (ref 96–106)
Creatinine, Ser: 0.94 mg/dL (ref 0.76–1.27)
Globulin, Total: 2.5 g/dL (ref 1.5–4.5)
Glucose: 289 mg/dL — ABNORMAL HIGH (ref 70–99)
Potassium: 4.1 mmol/L (ref 3.5–5.2)
Sodium: 137 mmol/L (ref 134–144)
Total Protein: 6.9 g/dL (ref 6.0–8.5)
eGFR: 95 mL/min/1.73 (ref >59)

## 2024-03-22 LAB — MICROALBUMIN / CREATININE URINE RATIO
Creatinine, Urine: 49.8 mg/dL
Microalb/Creat Ratio: <6 mg/g{creat} (ref 0–29)
Microalbumin, Urine: <3 ug/mL

## 2024-03-23 ENCOUNTER — Ambulatory Visit: Payer: Self-pay

## 2024-03-23 MED ORDER — ICOSAPENT ETHYL 1 G PO CAPS
2.0000 g | ORAL_CAPSULE | Freq: Two times a day (BID) | ORAL | 2 refills | Status: DC
Start: 2024-03-23 — End: 2024-06-22

## 2024-03-23 NOTE — Progress Notes (Signed)
 Please call and inform patient that his blood work showed severely elevated triglycerides which are a type of bad cholesterol.  The number is supposed to be less than 150 and it is 642.  His diabetes is also worse.  His A1c is now at 11.9.  Yesterday, I have sent Mounjaro  to his pharmacy and have given him samples.  Hopefully he tolerates it and get his blood sugars and cholesterol and his weight under control.  I also sent VASCEPA  tablets to help lower his triglycerides.  If his insurance does not cover it, please offer him the samples that we have in the office.  Recommend strict low-calorie diet and increased activity.  Thank you, Kapri Nero MD

## 2024-03-24 ENCOUNTER — Other Ambulatory Visit: Payer: Self-pay | Admitting: Family Medicine

## 2024-03-24 DIAGNOSIS — I10 Essential (primary) hypertension: Secondary | ICD-10-CM

## 2024-03-24 DIAGNOSIS — F419 Anxiety disorder, unspecified: Secondary | ICD-10-CM

## 2024-03-24 DIAGNOSIS — E1149 Type 2 diabetes mellitus with other diabetic neurological complication: Secondary | ICD-10-CM

## 2024-03-24 DIAGNOSIS — I69359 Hemiplegia and hemiparesis following cerebral infarction affecting unspecified side: Secondary | ICD-10-CM

## 2024-03-24 DIAGNOSIS — F339 Major depressive disorder, recurrent, unspecified: Secondary | ICD-10-CM

## 2024-03-27 ENCOUNTER — Other Ambulatory Visit: Payer: Self-pay

## 2024-03-27 DIAGNOSIS — F419 Anxiety disorder, unspecified: Secondary | ICD-10-CM

## 2024-03-27 DIAGNOSIS — F339 Major depressive disorder, recurrent, unspecified: Secondary | ICD-10-CM

## 2024-03-27 DIAGNOSIS — I69359 Hemiplegia and hemiparesis following cerebral infarction affecting unspecified side: Secondary | ICD-10-CM

## 2024-03-27 DIAGNOSIS — I10 Essential (primary) hypertension: Secondary | ICD-10-CM

## 2024-03-27 DIAGNOSIS — E1149 Type 2 diabetes mellitus with other diabetic neurological complication: Secondary | ICD-10-CM

## 2024-03-27 MED ORDER — ESCITALOPRAM OXALATE 20 MG PO TABS
20.0000 mg | ORAL_TABLET | Freq: Every day | ORAL | 0 refills | Status: DC
Start: 2024-03-27 — End: 2024-06-22

## 2024-03-27 MED ORDER — METFORMIN HCL 1000 MG PO TABS: 1000.0000 mg | ORAL_TABLET | Freq: Two times a day (BID) | ORAL | 0 refills | Status: DC

## 2024-03-27 MED ORDER — ARIPIPRAZOLE 5 MG PO TABS: 5.0000 mg | ORAL_TABLET | Freq: Every day | ORAL | 0 refills | Status: DC

## 2024-03-27 MED ORDER — CLOPIDOGREL BISULFATE 75 MG PO TABS
75.0000 mg | ORAL_TABLET | Freq: Every day | ORAL | 0 refills | Status: DC
Start: 2024-03-27 — End: 2024-06-22

## 2024-03-27 MED ORDER — LOSARTAN POTASSIUM 25 MG PO TABS: 25.0000 mg | ORAL_TABLET | Freq: Every day | ORAL | 0 refills | Status: DC

## 2024-03-27 MED ORDER — METOPROLOL SUCCINATE ER 25 MG PO TB24
25.0000 mg | ORAL_TABLET | Freq: Every day | ORAL | 0 refills | Status: DC
Start: 2024-03-27 — End: 2024-06-22

## 2024-03-27 MED ORDER — FENOFIBRATE 145 MG PO TABS: 145.0000 mg | ORAL_TABLET | Freq: Every day | ORAL | 2 refills | Status: DC

## 2024-04-03 ENCOUNTER — Ambulatory Visit: Payer: Self-pay

## 2024-04-03 ENCOUNTER — Telehealth: Payer: Self-pay

## 2024-04-03 NOTE — Telephone Encounter (Signed)
 FYI Only or Action Required?: Action required by provider: request for appointment.  Patient was last seen in primary care on 03/21/2024 by Sirivol, Mamatha, MD.  Called Nurse Triage reporting Hyperglycemia.  Symptoms began a week ago.  Interventions attempted: Nothing.  Symptoms are: stable. Pt. States he started Vascepa  recently and blood glucose began to be elevated. Stopped medication and today BS 180. Appointment made, but asking for advice.  Triage Disposition: Call PCP Now  Patient/caregiver understands and will follow disposition?:     Copied from CRM #[id number]. Topic: Clinical - Red Word Triage >> Apr 03, 2024  9:47 AM Ivette P wrote: [facility] that prompted transfer to Nurse Triage: new medication - blood sugar sky high. 295-315, stopped taking new medicaiton yesterday. blood sugar is normal range today. Reason for Disposition  [1] Blood glucose > 300 mg/dL (83.2 mmol/L) AND [7] two or more times in a row  Answer Assessment - Initial Assessment Questions 1. BLOOD GLUCOSE: What is your blood glucose level?      295-315 2. ONSET: When did you check the blood glucose?     Today - 180 3. USUAL RANGE: What is your glucose level usually? (e.g., usual fasting morning value, usual evening value)     120 or below 4. KETONES: Do you check for ketones (urine or blood test strips)? If Yes, ask: What does the test show now?      no 5. TYPE 1 or 2:  Do you know what type of diabetes you have?  (e.g., Type 1, Type 2, Gestational; doesn't know)      Type 2  6. INSULIN : Do you take insulin ? What type of insulin (s) do you use? What is the mode of delivery? (syringe, pen; injection or pump)?      no 7. DIABETES PILLS: Do you take any pills for your diabetes? If Yes, ask: Have you missed taking any pills recently?     yes 8. OTHER SYMPTOMS: Do you have any symptoms? (e.g., fever, frequent urination, difficulty breathing, dizziness, weakness, vomiting)     no 9.  PREGNANCY: Is there any chance you are pregnant? When was your last menstrual period?     N/a  Protocols used: Diabetes - High Blood Sugar-A-AH

## 2024-04-03 NOTE — Telephone Encounter (Signed)
 Please advice  Copied from CRM [id number]. Topic: Clinical - Lab/Test Results >> Apr 03, 2024  9:44 AM Ivette P wrote: Reason for CRM: PT called in about his imaging results from 03/21/2024, pt would like to know if his results are in and would like to know ASAP so he can go back to work.

## 2024-04-09 ENCOUNTER — Other Ambulatory Visit: Payer: Self-pay

## 2024-04-09 ENCOUNTER — Ambulatory Visit

## 2024-04-09 VITALS — BP 108/68 | HR 60 | Temp 98.3°F | Ht 69.0 in | Wt 256.0 lb

## 2024-04-09 DIAGNOSIS — Z6837 Body mass index (BMI) 37.0-37.9, adult: Secondary | ICD-10-CM

## 2024-04-09 DIAGNOSIS — E781 Pure hyperglyceridemia: Secondary | ICD-10-CM | POA: Insufficient documentation

## 2024-04-09 DIAGNOSIS — E1165 Type 2 diabetes mellitus with hyperglycemia: Secondary | ICD-10-CM

## 2024-04-09 DIAGNOSIS — E66812 Obesity, class 2: Secondary | ICD-10-CM | POA: Insufficient documentation

## 2024-04-09 DIAGNOSIS — Z152 Genetic susceptibility to obesity: Secondary | ICD-10-CM

## 2024-04-09 DIAGNOSIS — E8882 Obesity due to disruption of MC4R pathway: Secondary | ICD-10-CM | POA: Diagnosis not present

## 2024-04-09 MED ORDER — FREESTYLE LIBRE 3 PLUS SENSOR MISC: 3 refills | Status: DC

## 2024-04-09 MED ORDER — DEXCOM G7 SENSOR MISC
0 refills | Status: DC
Start: 2024-04-09 — End: 2024-06-12

## 2024-04-09 NOTE — Patient Instructions (Signed)
  VISIT SUMMARY: Today, we discussed your uncontrolled diabetes, dizziness, and high cholesterol levels. We reviewed your current medications and made some adjustments to help manage your blood sugar and cholesterol levels better.  YOUR PLAN: TYPE 2 DIABETES MELLITUS, UNCONTROLLED: Your blood sugar levels have been high, and your A1c has increased to 11.9%. This is causing symptoms like dizziness. -Start Mounjaro  injection once weekly. -Continue taking Farxiga  10 mg daily and metformin  1000 mg twice daily. -We discussed the injection sites and potential side effects of Mounjaro , including possible stomach upset. -Make dietary changes to reduce carbohydrate intake, especially potatoes and pasta. -Increase physical activity, including walking more than a mile daily and doing weight lifting or pushups. -Check if your insurance covers a continuous glucose monitor (CGM) and send a prescription if possible. Sample given today -Schedule a follow-up in one month to assess your blood sugar control and how well the medications are working.  DIZZINESS: Your dizziness is likely related to your high blood sugar levels. -Focus on managing your blood sugar levels to help reduce dizziness.  HYPERTRIGLYCERIDEMIA AND HYPERCHOLESTEROLEMIA: Your triglyceride levels are very high, and your total cholesterol is elevated. This increases your risk for heart problems. -Restart taking Vascepa  to lower your triglycerides. -Make dietary changes to help lower your cholesterol and triglyceride levels. -We will monitor your lipid levels along with your diabetes management.                      Contains text generated by [employer].                                 Contains text generated by [employer]bridge.

## 2024-04-09 NOTE — Assessment & Plan Note (Signed)
 UNCONTROLLED DIABETES TYPE 2 WITH HYPERGLYCEMIA, WITHOUT LONG TERM USE OF INSULIN .   Uncontrolled type 2 diabetes mellitus with recent blood sugar levels of 295-300 mg/dL and elevated hemoglobin A1c at 11.9%, indicating poor glycemic control. Previous A1c was 8.8% in April, showing an improving trend from 11.2%, but has since worsened. Symptoms include dizziness, possibly related to hyperglycemia. Current medications include Farxiga  10 mg daily and metformin  1000 mg twice daily. Mounjaro  has been prescribed to aid in glycemic control and weight management. Insurance covers Mounjaro  with a $25 copay. Discussed potential side effects of Mounjaro , including stomach upset. Emphasized the importance of dietary modifications and increased physical activity to manage diabetes and avoid insulin  therapy. - Start Mounjaro  injection once weekly. - Continue Farxiga  10 mg daily. - Continue metformin  1000 mg twice daily. - Educate on injection sites and potential side effects of Mounjaro , including possible stomach upset. - Encourage dietary modifications to reduce carbohydrate intake, particularly potatoes and pasta. - Increase physical activity, including walking more than a mile daily and incorporating weight lifting or pushups. - Check if insurance covers a continuous glucose monitor (CGM) and send prescription if possible.  - If CGM is not covered, consider out-of-pocket purchase for better glucose monitoring. Given sample of Dexcom G7 today. Downloaded app on his phone.  - Schedule follow-up in one month to assess blood sugar control and medication efficacy.

## 2024-04-09 NOTE — Assessment & Plan Note (Signed)
 Class 2 obesity with BMI 37.8 with major comorbidity of type 2 diabetes, uncontrolled and hyperlipidemia. Gained weight recently.  Started Mounjaro  for diabetes management. On farxiga .  Plan: start Mounjaro , increase physical activity, monitor total daily calorie intake. Will monitor weight.
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
## 2024-04-09 NOTE — Assessment & Plan Note (Addendum)
 Severely elevated triglycerides at 642 mg/dL and total cholesterol at 232 mg/dL. LDL cholesterol is within acceptable range at 87 mg/dL. Hypertriglyceridemia is a significant concern and may be contributing to cardiovascular risk. Vascepa  was prescribed to lower triglycerides, but was temporarily stopped by the patient. Reinforced the importance of restarting Vascepa  and dietary modifications to manage cholesterol levels. - Restart Vascepa  to lower triglycerides. ALREADY ON TRICOR  145 MG DAILY AND CRESTOR  40 MG DAILY.  - triglycerides and blood sugars elevation and weight gain could also be ABILIFY  side effect. If needed, could consider changing to alternate medication such as Latuda. - Reinforce dietary modifications to reduce cholesterol and triglyceride levels. - Monitor lipid levels in conjunction with diabetes management.

## 2024-04-16 ENCOUNTER — Telehealth: Payer: Self-pay
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
## 2024-04-19 ENCOUNTER — Ambulatory Visit: Payer: BC Managed Care – PPO | Admitting: Adult Health

## 2024-04-23 NOTE — Telephone Encounter (Signed)
 Done

## 2024-04-25 ENCOUNTER — Ambulatory Visit: Admitting: Adult Health

## 2024-04-25 ENCOUNTER — Other Ambulatory Visit: Payer: Self-pay

## 2024-05-07 ENCOUNTER — Ambulatory Visit

## 2024-05-07 VITALS — BP 104/60 | HR 72 | Temp 97.8°F | Resp 18 | Ht 69.0 in | Wt 253.4 lb

## 2024-05-07 DIAGNOSIS — Z23 Encounter for immunization: Secondary | ICD-10-CM | POA: Diagnosis not present

## 2024-05-07 DIAGNOSIS — E1165 Type 2 diabetes mellitus with hyperglycemia: Secondary | ICD-10-CM

## 2024-05-07 DIAGNOSIS — E785 Hyperlipidemia, unspecified: Secondary | ICD-10-CM

## 2024-05-07 DIAGNOSIS — I10 Essential (primary) hypertension: Secondary | ICD-10-CM

## 2024-05-07 DIAGNOSIS — I2510 Atherosclerotic heart disease of native coronary artery without angina pectoris: Secondary | ICD-10-CM

## 2024-05-07 DIAGNOSIS — Z7985 Long-term (current) use of injectable non-insulin antidiabetic drugs: Secondary | ICD-10-CM

## 2024-05-07 DIAGNOSIS — Z7984 Long term (current) use of oral hypoglycemic drugs: Secondary | ICD-10-CM

## 2024-05-07 DIAGNOSIS — E1159 Type 2 diabetes mellitus with other circulatory complications: Secondary | ICD-10-CM | POA: Diagnosis not present

## 2024-05-07 DIAGNOSIS — E66812 Obesity, class 2: Secondary | ICD-10-CM

## 2024-05-07 DIAGNOSIS — Z6838 Body mass index (BMI) 38.0-38.9, adult: Secondary | ICD-10-CM

## 2024-05-07 NOTE — Assessment & Plan Note (Addendum)
 Blood pressure is well-controlled on current medication regimen. - Continue losartan  25 mg daily - Continue metoprolol  XL 25 mg daily

## 2024-05-07 NOTE — Assessment & Plan Note (Addendum)
 Type 2 diabetes mellitus, UNCONTROLLED with COMPLICATIONS OF  hyperglycemia and peripheral neuropathy WITHOUT CURRENT LONG TERM USE OF INSULIN .  Uncontrolled type 2 diabetes mellitus with recent hemoglobin A1c of 11.9% and elevated blood glucose levels (287 mg/dL and 754 mg/dL). Reports tingling sensation in legs and feet, indicating peripheral neuropathy. Challenges with diet adherence and monitoring blood glucose levels. Financial constraints limit continuous glucose monitoring. - Continue Farxiga  10 mg daily - Continue metformin  1000 mg twice daily - Continue Mounjaro  10 mg daily - Encourage frequent blood glucose monitoring - Advise dietary modifications to improve glycemic control - Encourage physical activity, such as brisk walking for 10-15 minutes after meals - Discuss the importance of continuous glucose monitoring and its potential benefits - Schedule blood work around November 20th to reassess glycemic control. IF NO IMPROVEMENT IN BLOOD SUGARS AT THAT TIME, WILL START HIM ON INSULIN .

## 2024-05-07 NOTE — Assessment & Plan Note (Addendum)
 Hypertriglyceridemia and hypercholesterolemia Severe hypertriglyceridemia with triglyceride levels at 642 mg/dL and elevated total cholesterol. Not taking Vascepa  due to concerns about its impact on blood sugar, which were addressed. Vascepa  does not affect blood sugar and is important for lowering triglycerides. - Continue Tricor  145 mg daily - Continue Crestor  40 mg daily - Start Vascepa  as it does not affect blood sugar and is important for lowering triglycerides
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
## 2024-05-07 NOTE — Assessment & Plan Note (Addendum)
  Hx of PTCA w/o stent 2008. NSTEMI 05/24/2022 with DES to LAD on 05/25/2022. Continue aspirin indefinitely. Continue clopidogrel 75 mg through 05/26/2023 (or perhaps longer d/t hx of CVA). Rosuvastatin 40 mg daily, metoprolol XL 25 mg daily, losartan 25 mg daily, fenofibrate 145 mg daily. . EF 60-65% as of his last echo.  Will educate to only take ASPIRIN 81 MG DAILY as I see a 325 mg on his medication list.  He has an appointment with his cardiologist in November.

## 2024-05-07 NOTE — Assessment & Plan Note (Addendum)
 Obesity WITH SERIOUS COMORBIDITIES OF DIABETES UNCONTROLLED AND HYPERLIPIDEMIA. Obesity with recent weight loss of three pounds over the past month. Mounjaro  may be contributing to appetite suppression and weight loss. - Continue Mounjaro  10 mg daily. WILL INCREASE DOSE IF WEIGHT LOSS NOT AT GOAL.  - Encourage dietary modifications and increased physical activity to promote further weight loss

## 2024-05-07 NOTE — Patient Instructions (Signed)
  VISIT SUMMARY: Today, we reviewed your diabetes and cholesterol management. We discussed your blood sugar levels, medication adherence, and lifestyle changes to improve your health. We also addressed your concerns about triglycerides and provided guidance on managing your peripheral neuropathy and other health issues.  YOUR PLAN: TYPE 2 DIABETES MELLITUS WITH HYPERGLYCEMIA AND PERIPHERAL NEUROPATHY: Your blood sugar levels are high, and you have tingling in your legs and feet, which is a sign of nerve damage from diabetes. -Continue taking Farxiga  10 mg daily, metformin  1000 mg twice daily, and Mounjaro  10 mg daily. -Monitor your blood sugar levels more frequently. -Make dietary changes to help control your blood sugar. -Engage in physical activity, such as brisk walking for 10-15 minutes after meals. -Consider the benefits of continuous glucose monitoring, even though it is currently unaffordable. -Schedule blood work around November 20th to check your blood sugar control.  HYPERTRIGLYCERIDEMIA AND HYPERCHOLESTEROLEMIA: Your triglyceride levels are very high, and your total cholesterol is elevated. -Continue taking Tricor  145 mg daily and Crestor  40 mg daily. -Start taking Vascepa  as it does not affect blood sugar and will help lower your triglycerides.  HYPERTENSION: Your blood pressure is well-controlled with your current medications. -Continue taking losartan  25 mg daily and metoprolol  XL 25 mg daily.  CHRONIC KIDNEY DISEASE, UNSPECIFIED STAGE: Your kidney disease is being managed with losartan , which helps protect your kidneys. -Continue taking losartan  25 mg daily.  OBESITY: You have lost three pounds recently, which is a positive step towards managing your weight. -Continue taking Mounjaro  10 mg daily. -Make dietary changes and increase physical activity to promote further weight loss.  TOBACCO USE: You continue to use chewing tobacco. -Try to reduce your tobacco  use.  ONYCHOMYCOSIS AND TINEA PEDIS: You have toenail fungus and athlete's foot. -Soak your feet in Epsom salt and keep them dry. -Do not wear socks at home to allow your feet to breathe. -Ensure your toenails are properly trimmed.  GENERAL HEALTH MAINTENANCE: You are due for a flu vaccination and need to reschedule your diabetes eye exam. -Get your flu vaccine. -Schedule your diabetes eye exam.                      Contains text generated by [employer].                                 Contains text generated by [employer].

## 2024-05-09 ENCOUNTER — Other Ambulatory Visit: Payer: Self-pay

## 2024-05-09 DIAGNOSIS — E1149 Type 2 diabetes mellitus with other diabetic neurological complication: Secondary | ICD-10-CM

## 2024-05-24 NOTE — Progress Notes (Signed)
 Jared Wood                                          MRN: [id number]   05/24/2024   The [facility] Quality Team Specialist reviewed this patient medical record for the purposes of chart review for care gap closure. The following were reviewed: chart review for care gap closure-glycemic status assessment.    [facility] Quality Team

## 2024-06-06 DIAGNOSIS — L570 Actinic keratosis: Secondary | ICD-10-CM | POA: Diagnosis not present

## 2024-06-06 DIAGNOSIS — L814 Other melanin hyperpigmentation: Secondary | ICD-10-CM | POA: Diagnosis not present

## 2024-06-06 DIAGNOSIS — D225 Melanocytic nevi of trunk: Secondary | ICD-10-CM | POA: Diagnosis not present

## 2024-06-06 DIAGNOSIS — D485 Neoplasm of uncertain behavior of skin: Secondary | ICD-10-CM | POA: Diagnosis not present

## 2024-06-06 DIAGNOSIS — C44311 Basal cell carcinoma of skin of nose: Secondary | ICD-10-CM | POA: Diagnosis not present

## 2024-06-07 ENCOUNTER — Other Ambulatory Visit: Payer: Self-pay

## 2024-06-07 DIAGNOSIS — E1149 Type 2 diabetes mellitus with other diabetic neurological complication: Secondary | ICD-10-CM

## 2024-06-12 ENCOUNTER — Other Ambulatory Visit: Payer: Self-pay

## 2024-06-12 DIAGNOSIS — E1165 Type 2 diabetes mellitus with hyperglycemia: Secondary | ICD-10-CM

## 2024-06-14 DIAGNOSIS — R0789 Other chest pain: Secondary | ICD-10-CM | POA: Diagnosis not present

## 2024-06-14 DIAGNOSIS — I252 Old myocardial infarction: Secondary | ICD-10-CM | POA: Diagnosis not present

## 2024-06-14 DIAGNOSIS — R61 Generalized hyperhidrosis: Secondary | ICD-10-CM | POA: Diagnosis not present

## 2024-06-14 DIAGNOSIS — R079 Chest pain, unspecified: Secondary | ICD-10-CM | POA: Diagnosis not present

## 2024-06-14 DIAGNOSIS — Z955 Presence of coronary angioplasty implant and graft: Secondary | ICD-10-CM | POA: Diagnosis not present

## 2024-06-14 DIAGNOSIS — R0602 Shortness of breath: Secondary | ICD-10-CM | POA: Diagnosis not present

## 2024-06-14 DIAGNOSIS — Z7902 Long term (current) use of antithrombotics/antiplatelets: Secondary | ICD-10-CM | POA: Diagnosis not present

## 2024-06-14 DIAGNOSIS — M25512 Pain in left shoulder: Secondary | ICD-10-CM | POA: Diagnosis not present

## 2024-06-14 DIAGNOSIS — I517 Cardiomegaly: Secondary | ICD-10-CM | POA: Diagnosis not present

## 2024-06-14 DIAGNOSIS — D649 Anemia, unspecified: Secondary | ICD-10-CM | POA: Diagnosis not present

## 2024-06-15 DIAGNOSIS — R079 Chest pain, unspecified: Secondary | ICD-10-CM | POA: Diagnosis not present

## 2024-06-15 DIAGNOSIS — D649 Anemia, unspecified: Secondary | ICD-10-CM | POA: Diagnosis not present

## 2024-06-15 DIAGNOSIS — R5383 Other fatigue: Secondary | ICD-10-CM | POA: Diagnosis not present

## 2024-06-15 DIAGNOSIS — R0789 Other chest pain: Secondary | ICD-10-CM | POA: Diagnosis not present

## 2024-06-21 ENCOUNTER — Other Ambulatory Visit

## 2024-06-21 DIAGNOSIS — I10 Essential (primary) hypertension: Secondary | ICD-10-CM | POA: Diagnosis not present

## 2024-06-21 DIAGNOSIS — E1149 Type 2 diabetes mellitus with other diabetic neurological complication: Secondary | ICD-10-CM

## 2024-06-21 DIAGNOSIS — E785 Hyperlipidemia, unspecified: Secondary | ICD-10-CM

## 2024-06-22 ENCOUNTER — Other Ambulatory Visit: Payer: Self-pay

## 2024-06-22 ENCOUNTER — Ambulatory Visit

## 2024-06-22 VITALS — BP 110/60 | HR 63 | Temp 97.4°F | Resp 16 | Ht 69.0 in | Wt 254.0 lb

## 2024-06-22 DIAGNOSIS — E785 Hyperlipidemia, unspecified: Secondary | ICD-10-CM

## 2024-06-22 DIAGNOSIS — E1149 Type 2 diabetes mellitus with other diabetic neurological complication: Secondary | ICD-10-CM

## 2024-06-22 DIAGNOSIS — F331 Major depressive disorder, recurrent, moderate: Secondary | ICD-10-CM

## 2024-06-22 DIAGNOSIS — K219 Gastro-esophageal reflux disease without esophagitis: Secondary | ICD-10-CM | POA: Insufficient documentation

## 2024-06-22 DIAGNOSIS — E1159 Type 2 diabetes mellitus with other circulatory complications: Secondary | ICD-10-CM

## 2024-06-22 DIAGNOSIS — I69359 Hemiplegia and hemiparesis following cerebral infarction affecting unspecified side: Secondary | ICD-10-CM | POA: Insufficient documentation

## 2024-06-22 DIAGNOSIS — I2510 Atherosclerotic heart disease of native coronary artery without angina pectoris: Secondary | ICD-10-CM

## 2024-06-22 DIAGNOSIS — E1165 Type 2 diabetes mellitus with hyperglycemia: Secondary | ICD-10-CM | POA: Diagnosis not present

## 2024-06-22 DIAGNOSIS — I10 Essential (primary) hypertension: Secondary | ICD-10-CM

## 2024-06-22 DIAGNOSIS — F4320 Adjustment disorder, unspecified: Secondary | ICD-10-CM

## 2024-06-22 DIAGNOSIS — F339 Major depressive disorder, recurrent, unspecified: Secondary | ICD-10-CM | POA: Insufficient documentation

## 2024-06-22 LAB — CBC WITH DIFFERENTIAL/PLATELET
Basophils Absolute: 0.1 x10E3/uL (ref 0.0–0.2)
Basos: 1 %
EOS (ABSOLUTE): 0.3 x10E3/uL (ref 0.0–0.4)
Eos: 4 %
Hematocrit: 40.9 % (ref 37.5–51.0)
Hemoglobin: 13.4 g/dL (ref 13.0–17.7)
Immature Grans (Abs): 0 x10E3/uL (ref 0.0–0.1)
Immature Granulocytes: 0 %
Lymphocytes Absolute: 1.8 x10E3/uL (ref 0.7–3.1)
Lymphs: 27 %
MCH: 27.6 pg (ref 26.6–33.0)
MCHC: 32.8 g/dL (ref 31.5–35.7)
MCV: 84 fL (ref 79–97)
Monocytes Absolute: 0.5 x10E3/uL (ref 0.1–0.9)
Monocytes: 7 %
Neutrophils Absolute: 4 x10E3/uL (ref 1.4–7.0)
Neutrophils: 61 %
Platelets: 245 x10E3/uL (ref 150–450)
RBC: 4.86 x10E6/uL (ref 4.14–5.80)
RDW: 14.2 % (ref 11.6–15.4)
WBC: 6.6 x10E3/uL (ref 3.4–10.8)

## 2024-06-22 LAB — LIPID PANEL
Chol/HDL Ratio: 3.5 ratio (ref 0.0–5.0)
Cholesterol, Total: 109 mg/dL (ref 100–199)
HDL: 31 mg/dL — ABNORMAL LOW (ref >39)
LDL Chol Calc (NIH): 33 mg/dL (ref 0–99)
Triglycerides: 299 mg/dL — ABNORMAL HIGH (ref 0–149)
VLDL Cholesterol Cal: 45 mg/dL — ABNORMAL HIGH (ref 5–40)

## 2024-06-22 LAB — COMPREHENSIVE METABOLIC PANEL WITH GFR
ALT: 32 IU/L (ref 0–44)
AST: 27 IU/L (ref 0–40)
Albumin: 4.3 g/dL (ref 3.8–4.9)
Alkaline Phosphatase: 43 IU/L — ABNORMAL LOW (ref 47–123)
BUN/Creatinine Ratio: 15 (ref 9–20)
BUN: 15 mg/dL (ref 6–24)
Bilirubin Total: 0.4 mg/dL (ref 0.0–1.2)
CO2: 21 mmol/L (ref 20–29)
Calcium: 9.4 mg/dL (ref 8.7–10.2)
Chloride: 102 mmol/L (ref 96–106)
Creatinine, Ser: 0.99 mg/dL (ref 0.76–1.27)
Globulin, Total: 2.6 g/dL (ref 1.5–4.5)
Glucose: 190 mg/dL — ABNORMAL HIGH (ref 70–99)
Potassium: 4.1 mmol/L (ref 3.5–5.2)
Sodium: 140 mmol/L (ref 134–144)
Total Protein: 6.9 g/dL (ref 6.0–8.5)
eGFR: 88 mL/min/1.73 (ref >59)

## 2024-06-22 LAB — HEMOGLOBIN A1C
Est. average glucose Bld gHb Est-mCnc: 232 mg/dL
Hgb A1c MFr Bld: 9.7 % — ABNORMAL HIGH (ref 4.8–5.6)

## 2024-06-22 MED ORDER — DAPAGLIFLOZIN PROPANEDIOL 10 MG PO TABS: 10.0000 mg | ORAL_TABLET | Freq: Every day | ORAL | 1 refills | Status: DC

## 2024-06-22 MED ORDER — METFORMIN HCL 1000 MG PO TABS: 1000.0000 mg | ORAL_TABLET | Freq: Two times a day (BID) | ORAL | 1 refills | Status: AC

## 2024-06-22 MED ORDER — ESCITALOPRAM OXALATE 20 MG PO TABS: 20.0000 mg | ORAL_TABLET | Freq: Every day | ORAL | 1 refills | Status: AC

## 2024-06-22 MED ORDER — GABAPENTIN 300 MG PO CAPS
300.0000 mg | ORAL_CAPSULE | Freq: Three times a day (TID) | ORAL | 1 refills | Status: AC
Start: 2024-06-22 — End: ?

## 2024-06-22 MED ORDER — DICYCLOMINE HCL 10 MG PO CAPS: 10.0000 mg | ORAL_CAPSULE | Freq: Two times a day (BID) | ORAL | 2 refills | Status: DC

## 2024-06-22 MED ORDER — SILDENAFIL CITRATE 50 MG PO TABS: 50.0000 mg | ORAL_TABLET | Freq: Every day | ORAL | 1 refills | Status: AC | PRN

## 2024-06-22 MED ORDER — FUROSEMIDE 20 MG PO TABS: 20.0000 mg | ORAL_TABLET | Freq: Every day | ORAL | 2 refills | Status: AC

## 2024-06-22 MED ORDER — CLOPIDOGREL BISULFATE 75 MG PO TABS: 75.0000 mg | ORAL_TABLET | Freq: Every day | ORAL | 1 refills | Status: AC

## 2024-06-22 MED ORDER — TOPIRAMATE 50 MG PO TABS: 50.0000 mg | ORAL_TABLET | Freq: Two times a day (BID) | ORAL | 2 refills | Status: AC

## 2024-06-22 MED ORDER — ROSUVASTATIN CALCIUM 40 MG PO TABS: 40.0000 mg | ORAL_TABLET | Freq: Every day | ORAL | 1 refills | Status: AC

## 2024-06-22 MED ORDER — PANTOPRAZOLE SODIUM 20 MG PO TBEC: 20.0000 mg | DELAYED_RELEASE_TABLET | Freq: Every day | ORAL | 1 refills | Status: DC

## 2024-06-22 MED ORDER — ICOSAPENT ETHYL 1 G PO CAPS: 2.0000 g | ORAL_CAPSULE | Freq: Two times a day (BID) | ORAL | 2 refills | Status: DC

## 2024-06-22 MED ORDER — ARIPIPRAZOLE 5 MG PO TABS: 5.0000 mg | ORAL_TABLET | Freq: Every day | ORAL | 1 refills | Status: AC

## 2024-06-22 MED ORDER — UBRELVY 100 MG PO TABS: 100.0000 mg | ORAL_TABLET | ORAL | 11 refills | Status: AC | PRN

## 2024-06-22 MED ORDER — METOPROLOL SUCCINATE ER 25 MG PO TB24: 25.0000 mg | ORAL_TABLET | Freq: Every day | ORAL | 0 refills | Status: AC

## 2024-06-22 MED ORDER — DEXCOM G7 SENSOR MISC: 0 refills | Status: DC

## 2024-06-22 MED ORDER — LOSARTAN POTASSIUM 25 MG PO TABS: 25.0000 mg | ORAL_TABLET | Freq: Every day | ORAL | 1 refills | Status: AC

## 2024-06-22 MED ORDER — NITROGLYCERIN 0.4 MG SL SUBL: 0.4000 mg | SUBLINGUAL_TABLET | SUBLINGUAL | 0 refills | Status: AC | PRN

## 2024-06-22 MED ORDER — TIRZEPATIDE 10 MG/0.5ML ~~LOC~~ SOAJ: 10.0000 mg | SUBCUTANEOUS | 2 refills | Status: AC

## 2024-06-22 MED ORDER — PANTOPRAZOLE SODIUM 20 MG PO TBEC
20.0000 mg | DELAYED_RELEASE_TABLET | Freq: Two times a day (BID) | ORAL | 1 refills | Status: AC
Start: 2024-06-22 — End: ?

## 2024-06-22 NOTE — Assessment & Plan Note (Addendum)
 As above., Orders:   gabapentin  (NEURONTIN ) 300 MG capsule; Take 1 capsule (300 mg total) by mouth 3 (three) times daily.   metFORMIN  (GLUCOPHAGE ) 1000 MG tablet; Take 1 tablet (1,000 mg total) by mouth 2 (two) times daily.

## 2024-06-22 NOTE — Assessment & Plan Note (Signed)
 Coronary artery disease with angina pectoris Recent episode of chest tightness and shortness of breath during physical activity. Negative stress test and normal left ventricular function. Troponins were negative, indicating no acute myocardial infarction. Current medications include aspirin , Plavix , metoprolol , and nitroglycerin . Cardiologist follow-up pending. Discussion about potential increase in metoprolol  dosage by cardiologist. - Continue aspirin , Plavix , metoprolol , and nitroglycerin . - Follow up with cardiologist. - Contact cardiologist if no response. - Consider increasing metoprolol  dosage as per cardiologist's recommendation.    Onychomycosis (toenail fungus) Presence of toenail fungus noted. No severe enough to warrant podiatry referral. - Continue Epsom salt soaks, filing, and trimming toenails.

## 2024-06-22 NOTE — Assessment & Plan Note (Signed)
 Hypertriglyceridemia Triglycerides decreased from 642 mg/dL to 700 mg/dL, showing improvement but still above target of less than 150 mg/dL. Current medications include Tricor , Crestor , and Vascepa . - Continue Tricor  145 mg, Crestor  40, and Vascepa .

## 2024-06-22 NOTE — Progress Notes (Signed)
 Subjective:  Patient ID: Jared Wood, male    DOB: 1966/05/18  Age: 58 y.o. MRN: [id number]  Chief Complaint  Patient presents with   Medical Management of Chronic Issues    HPI: Discussed the use of AI scribe software for clinical note transcription with the patient, who gave verbal consent to proceed.    History of Present Illness   Jared Wood is a 58 year old male with diabetes and coronary artery disease who presents for follow-up after a recent emergency room visit for chest pain. He is accompanied by his wife, Jared Wood.  Chest pain and dyspnea - Chest pain occurred on June 14, 2024, during yard work, described as chest tightness with associated shortness of breath - Symptoms initially subsided with rest but recurred upon walking to the mailbox - Evaluated at urgent care and emergency room; stress test showed normal left ventricular function and no segmental wall motion abnormalities - Troponin levels were negative - Nitroglycerin  available for angina; no recent changes in medication regimen  Glycemic control - Diabetes mellitus with previous A1c of 11.9% in October, improved to 9.7% on recent blood work - Current medications include Farxiga  10 mg daily, metformin  1000 mg twice daily, and Mounjaro  10 mg daily - Dietary challenges present - Engages in regular physical activity, including rabbit hunting three times a week  Hyperlipidemia and cardiovascular risk management - Triglycerides decreased from 642 mg/dL to 700 mg/dL - Current medications include Tricor , Crestor , Vascepa  (recently started), aspirin , metoprolol , rosuvastatin , losartan , and fenofibrate   Dermatologic findings - History of toenail fungus and dry skin - No numbness or tingling in feet - Maintains good sensation in feet  Gastrointestinal symptoms - Heartburn managed with omeprazole, taken twice daily             04/09/2024    9:27 AM 03/21/2024    8:06 AM 08/18/2023    8:12 AM  05/17/2023    9:14 AM 05/15/2021   12:39 PM  Depression screen PHQ 2/9  Decreased Interest 0 0  0 0  Down, Depressed, Hopeless 0 0 0 0 0  PHQ - 2 Score 0 0 0 0 0  Altered sleeping 0 0 3 0   Tired, decreased energy 0 0 2 0   Change in appetite 0 0 0 0   Feeling bad or failure about yourself  0 0 0 0   Trouble concentrating 0 0 0 0   Moving slowly or fidgety/restless 0 0 0 0   Suicidal thoughts 0 0 0 0   PHQ-9 Score 0  0  5  0    Difficult doing work/chores Not difficult at all Not difficult at all Not difficult at all Not difficult at all      Data saved with a previous flowsheet row definition        04/09/2024    9:27 AM  Fall Risk   Falls in the past year? 0  Number falls in past yr: 0  Injury with Fall? 0  Risk for fall due to : No Fall Risks  Follow up Falls evaluation completed    Patient Care Team: Melford Tullier, MD as PCP - General (Family Medicine) Dann Candyce RAMAN, MD as PCP - Cardiology (Cardiology)   Review of Systems  Constitutional:  Negative for chills, fatigue, fever and unexpected weight change.  HENT:  Negative for congestion, ear pain, sinus pain and sore throat.   Respiratory:  Negative for cough and shortness of breath.  Cardiovascular:  Positive for chest pain. Negative for palpitations.  Gastrointestinal:  Negative for abdominal pain, blood in stool, constipation, diarrhea, nausea and vomiting.  Endocrine: Negative for polydipsia.  Genitourinary:  Negative for dysuria.  Musculoskeletal:  Negative for back pain.  Skin:  Negative for rash.  Neurological:  Negative for headaches.    Current Outpatient Medications on File Prior to Visit  Medication Sig Dispense Refill   aspirin  EC 81 MG tablet Take 81 mg by mouth once.     fenofibrate  (TRICOR ) 145 MG tablet Take 1 tablet (145 mg total) by mouth daily. 30 tablet 2   levocetirizine (XYZAL) 5 MG tablet Take 5 mg by mouth at bedtime.     loratadine-pseudoephedrine (CLARITIN-D 24-HOUR) 10-240 MG  24 hr tablet Take 1 tablet by mouth daily.     Multiple Vitamins-Minerals (CENTRUM MEN) TABS Take 1 tablet by mouth daily with breakfast.     No current facility-administered medications on file prior to visit.   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   Depression    Diabetes mellitus without complication (HCC)    Type 2   Essential hypertension 04/07/2015   GERD (gastroesophageal reflux disease)    Hard of hearing    Headache 06/30/2015   Heart attack (HCC) 05/2022   Heart disease    Hemiplegic migraine    History of bleeding ulcers    History of Clostridium difficile colitis    History of kidney stones    Hyperlipemia    Hypertension    Left-sided weakness    Migraine    Migraine with aura and without status migrainosus, not intractable 06/17/2019   Mixed hyperlipidemia 04/07/2015   Neuropathy    Pain in joint of left shoulder 07/24/2015   PONV (postoperative nausea and vomiting)    Nausea   Primary osteoarthritis of left knee 08/14/2015   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 was given TPA and underwent rehabilitation.   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 (HCC) 04/07/2015   Past Surgical History:  Procedure Laterality Date    ADENOIDECTOMY     APPENDECTOMY  2002   CHOLECYSTECTOMY     2003 or 2004   COLONOSCOPY  01/17/2017   Small internal hemorrhoids. Other wise normal colonoscopy   COLONOSCOPY W/ ENDOSCOPIC US      CORONARY ANGIOPLASTY  2008   CORONARY ANGIOPLASTY WITH STENT PLACEMENT  05/2022   ESOPHAGOGASTRODUODENOSCOPY  09/20/2011   Mild gastritis. Otherwise normal esophagogastroduodenoscopy   HAND SURGERY Left 2001   hand reconstruction   HERNIA REPAIR     LITHOTRIPSY     SHOULDER ARTHROSCOPY WITH OPEN ROTATOR CUFF REPAIR Right    TONSILLECTOMY  1972   TOTAL KNEE ARTHROPLASTY Right 12/01/2015   Procedure: TOTAL KNEE ARTHROPLASTY;  Surgeon: Marcey  Rubie, MD;  Location: [facility] OR;  Service: Orthopedics;  Laterality: Right;    Family History  Problem Relation Age of Onset   Myocarditis Mother    Prostate cancer Father    Liver cancer Father    Kidney cancer Father    Other Father        lymph nodes   COPD Father    Emphysema Father    Congestive Heart Failure Father    High blood pressure Father    Arthritis Father    Migraines Neg Hx    Social History   Socioeconomic History   Marital status: Married    Spouse name: Not on file   Number of children: 3   Years of education: 2 yrs college   Highest education level: Not on file  Occupational History   Not on file  Tobacco Use   Smoking status: Former   Smokeless tobacco: Current    Types: Chew    Last attempt to quit: 11/2021   Tobacco comments:    uses 1 can/day  Vaping Use   Vaping status: Former  Substance and Sexual Activity   Alcohol use: Yes    Comment: occasional   Drug use: No    Comment: quit hard drugs in 1990   Sexual activity: Yes    Partners: Female  Other Topics Concern   Not on file  Social History Narrative   Lives at home with spouse   Caffeine: not as much as I used to, can drink up to 2-3 cups/day   Social Drivers of Corporate Investment Banker Strain: Not on file  Food Insecurity: Low Risk  (09/29/2022)    Received from [facility]   Hunger Vital Sign    Within the past 12 months, you worried that your food would run out before you got money to buy more: Never true    Within the past 12 months, the food you bought just didn't last and you didn't have money to get more: Not on file  Transportation Needs: No Transportation Needs (09/29/2022)   Received from [facility] visits prior to 10/02/2022.   Transportation    In the past 12 months, has lack of reliable transportation kept you from medical appointments, meetings, work or from getting things needed for daily living?: No  Physical Activity: Not on file  Stress: Not on file  Social Connections: Not on file    Objective:  BP 110/60   Pulse 63   Temp (!) 97.4 F (36.3 C)   Resp 16   Ht 5' 9 (1.753 m)   Wt 254 lb (115.2 kg)   SpO2 95%   BMI 37.51 kg/m      06/22/2024    7:55 AM 05/07/2024    8:09 AM 04/09/2024    9:14 AM  BP/Weight  Systolic BP 110 104 108  Diastolic BP 60 60 68  Wt. (Lbs) 254 253.4 256  BMI 37.51 kg/m2 37.42 kg/m2 37.8 kg/m2    Physical Exam Vitals and nursing note reviewed.  Constitutional:      Appearance: He is obese.  HENT:     Head: Normocephalic and atraumatic.     Mouth/Throat:     Comments: Poor dentition Cardiovascular:     Rate and Rhythm: Normal rate and regular rhythm.  Pulmonary:     Breath sounds: Normal breath sounds.  Musculoskeletal:        General: Normal range of motion.  Neurological:  General: No focal deficit present.     Mental Status: He is alert and oriented to person, place, and time.  Psychiatric:        Mood and Affect: Mood normal.      Diabetic foot exam was performed with the following findings:   Normal sensation of 10g monofilament Dry skin, onychomycosis of toe nails noted. Low volume dorsalis pedis pulses bilaterally      Lab Results  Component Value Date   WBC 6.6 06/21/2024   HGB 13.4 06/21/2024   HCT 40.9 06/21/2024    PLT 245 06/21/2024   GLUCOSE 190 (H) 06/21/2024   CHOL 109 06/21/2024   TRIG 299 (H) 06/21/2024   HDL 31 (L) 06/21/2024   LDLDIRECT 51.6 04/21/2021   LDLCALC 33 06/21/2024   ALT 32 06/21/2024   AST 27 06/21/2024   NA 140 06/21/2024   K 4.1 06/21/2024   CL 102 06/21/2024   CREATININE 0.99 06/21/2024   BUN 15 06/21/2024   CO2 21 06/21/2024   TSH 2.050 05/17/2023   INR 1.0 07/06/2022   HGBA1C 9.7 (H) 06/21/2024    Results for orders placed or performed in visit on 06/21/24  CBC with Differential/Platelet   Collection Time: 06/21/24  8:08 AM  Result Value Ref Range   WBC 6.6 3.4 - 10.8 x10E3/uL   RBC 4.86 4.14 - 5.80 x10E6/uL   Hemoglobin 13.4 13.0 - 17.7 g/dL   Hematocrit 59.0 62.4 - 51.0 %   MCV 84 79 - 97 fL   MCH 27.6 26.6 - 33.0 pg   MCHC 32.8 31.5 - 35.7 g/dL   RDW 85.7 88.3 - 84.5 %   Platelets 245 150 - 450 x10E3/uL   Neutrophils 61 Not Estab. %   Lymphs 27 Not Estab. %   Monocytes 7 Not Estab. %   Eos 4 Not Estab. %   Basos 1 Not Estab. %   Neutrophils Absolute 4.0 1.4 - 7.0 x10E3/uL   Lymphocytes Absolute 1.8 0.7 - 3.1 x10E3/uL   Monocytes Absolute 0.5 0.1 - 0.9 x10E3/uL   EOS (ABSOLUTE) 0.3 0.0 - 0.4 x10E3/uL   Basophils Absolute 0.1 0.0 - 0.2 x10E3/uL   Immature Granulocytes 0 Not Estab. %   Immature Grans (Abs) 0.0 0.0 - 0.1 x10E3/uL  Comprehensive metabolic panel with GFR   Collection Time: 06/21/24  8:08 AM  Result Value Ref Range   Glucose 190 (H) 70 - 99 mg/dL   BUN 15 6 - 24 mg/dL   Creatinine, Ser 9.00 0.76 - 1.27 mg/dL   eGFR 88 >40 fO/fpw/8.26   BUN/Creatinine Ratio 15 9 - 20   Sodium 140 134 - 144 mmol/L   Potassium 4.1 3.5 - 5.2 mmol/L   Chloride 102 96 - 106 mmol/L   CO2 21 20 - 29 mmol/L   Calcium  9.4 8.7 - 10.2 mg/dL   Total Protein 6.9 6.0 - 8.5 g/dL   Albumin 4.3 3.8 - 4.9 g/dL   Globulin, Total 2.6 1.5 - 4.5 g/dL   Bilirubin Total 0.4 0.0 - 1.2 mg/dL   Alkaline Phosphatase 43 (L) 47 - 123 IU/L   AST 27 0 - 40 IU/L   ALT 32 0 -  44 IU/L  Hemoglobin A1c   Collection Time: 06/21/24  8:08 AM  Result Value Ref Range   Hgb A1c MFr Bld 9.7 (H) 4.8 - 5.6 %   Est. average glucose Bld gHb Est-mCnc 232 mg/dL  Lipid panel   Collection Time: 06/21/24  8:08 AM  Result Value Ref Range   Cholesterol, Total 109 100 - 199 mg/dL   Triglycerides 700 (H) 0 - 149 mg/dL   HDL 31 (L) >60 mg/dL   VLDL Cholesterol Cal 45 (H) 5 - 40 mg/dL   LDL Chol Calc (NIH) 33 0 - 99 mg/dL   Chol/HDL Ratio 3.5 0.0 - 5.0 ratio  .  Assessment & Plan:   Assessment & Plan Moderate episode of recurrent major depressive disorder (HCC)  Orders:   ARIPiprazole  (ABILIFY ) 5 MG tablet; Take 1 tablet (5 mg total) by mouth daily.  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 HEMIPLEGIA RESOLVED SINCE.  Orders:   clopidogrel  (PLAVIX ) 75 MG tablet; Take 1 tablet (75 mg total) by mouth daily.   losartan  (COZAAR ) 25 MG tablet; Take 1 tablet (25 mg total) by mouth daily.  Uncontrolled type 2 diabetes mellitus with hyperglycemia (HCC) Type 2 diabetes mellitus, uncontrolled, with diabetic neuropathy WITHOUT CURRENT LONG TERM USE OF INSULIN  A1c decreased from 11.9% to 9.7%, indicating some improvement but remains uncontrolled. Current medications include Farxiga , metformin , and Mounjaro . Insulin  therapy deferred due to potential weight gain, but will be considered if A1c does not decrease below 9% in three months. Emphasis on dietary modifications and portion control, especially during upcoming holidays. - Continue Farxiga  10 mg, metformin  1000 mg twice daily, Mounjaro  10 mg daily. - Will reassess A1c in three months. - Encouraged dietary modifications and portion control. - Encouraged increased physical activity. - Will consider insulin  therapy if A1c does not decrease below 8% in three months. Orders:   Continuous Glucose Sensor (DEXCOM G7 SENSOR) MISC; Apply sensor every 10 days  Type  2 diabetes mellitus with other diabetic neurological complication (HCC) As above., Orders:   gabapentin  (NEURONTIN ) 300 MG capsule; Take 1 capsule (300 mg total) by mouth 3 (three) times daily.   metFORMIN  (GLUCOPHAGE ) 1000 MG tablet; Take 1 tablet (1,000 mg total) by mouth 2 (two) times daily.  Gastro-esophageal reflux disease without esophagitis Gastroesophageal reflux disease Omeprazole not recommended due to interaction with Plavix . Switching to pantoprazole  (Protonix ) as an alternative. - Discontinued omeprazole. - Started pantoprazole  (Protonix ) 20 mg twice daily with meals.    Essential (primary) hypertension Essential hypertension Blood pressure management ongoing with current medications including losartan  and metoprolol . - Continue losartan  25 mg and metoprolol  XL 25 mg. Orders:   metoprolol  succinate (TOPROL -XL) 25 MG 24 hr tablet; Take 1 tablet (25 mg total) by mouth daily.  Coronary artery disease due to type 2 diabetes mellitus (HCC) Coronary artery disease with angina pectoris Recent episode of chest tightness and shortness of breath during physical activity. Negative stress test and normal left ventricular function. Troponins were negative, indicating no acute myocardial infarction. Current medications include aspirin , Plavix , metoprolol , and nitroglycerin . Cardiologist follow-up pending. Discussion about potential increase in metoprolol  dosage by cardiologist. - Continue aspirin , Plavix , metoprolol , and nitroglycerin . - Follow up with cardiologist. - Contact cardiologist if no response. - Consider increasing metoprolol  dosage as per cardiologist's recommendation.    Onychomycosis (toenail fungus) Presence of toenail fungus noted. No severe enough to warrant podiatry referral. - Continue Epsom salt soaks, filing, and trimming toenails.       Hyperlipidemia LDL goal <100 Hypertriglyceridemia Triglycerides decreased from 642 mg/dL to 700 mg/dL, showing improvement  but still above target of less than 150 mg/dL. Current medications include Tricor , Crestor , and Vascepa . - Continue Tricor  145 mg, Crestor  40, and Vascepa .      Body  mass index is 37.51 kg/m.  Assessment and Plan      Meds ordered this encounter  Medications   ARIPiprazole  (ABILIFY ) 5 MG tablet    Sig: Take 1 tablet (5 mg total) by mouth daily.    Dispense:  90 tablet    Refill:  1    This prescription was filled on 12/13/2023. Any refills authorized will be placed on file.   clopidogrel  (PLAVIX ) 75 MG tablet    Sig: Take 1 tablet (75 mg total) by mouth daily.    Dispense:  90 tablet    Refill:  1    This prescription was filled on 12/13/2023. Any refills authorized will be placed on file.   Continuous Glucose Sensor (DEXCOM G7 SENSOR) MISC    Sig: Apply sensor every 10 days    Dispense:  3 each    Refill:  0   dapagliflozin  propanediol (FARXIGA ) 10 MG TABS tablet    Sig: Take 1 tablet (10 mg total) by mouth daily.    Dispense:  90 tablet    Refill:  1    This prescription was filled on 01/12/2024. Any refills authorized will be placed on file.   dicyclomine  (BENTYL ) 10 MG capsule    Sig: Take 1 capsule (10 mg total) by mouth in the morning and at bedtime.    Dispense:  60 capsule    Refill:  2    This prescription was filled on 04/10/2024. Any refills authorized will be placed on file.   escitalopram  (LEXAPRO ) 20 MG tablet    Sig: Take 1 tablet (20 mg total) by mouth daily.    Dispense:  90 tablet    Refill:  1    This prescription was filled on 12/13/2023. Any refills authorized will be placed on file.   furosemide  (LASIX ) 20 MG tablet    Sig: Take 1 tablet (20 mg total) by mouth daily.    Dispense:  30 tablet    Refill:  2   gabapentin  (NEURONTIN ) 300 MG capsule    Sig: Take 1 capsule (300 mg total) by mouth 3 (three) times daily.    Dispense:  270 capsule    Refill:  1   icosapent  Ethyl (VASCEPA ) 1 g capsule    Sig: Take 2 capsules (2 g total) by mouth 2 (two)  times daily.    Dispense:  120 capsule    Refill:  2    Patient already on Crestor  40 mg, Tricor  145 mg daily but has severely elevated triglycerides.  Needs Vascepa    losartan  (COZAAR ) 25 MG tablet    Sig: Take 1 tablet (25 mg total) by mouth daily.    Dispense:  90 tablet    Refill:  1    This prescription was filled on 12/13/2023. Any refills authorized will be placed on file.   metFORMIN  (GLUCOPHAGE ) 1000 MG tablet    Sig: Take 1 tablet (1,000 mg total) by mouth 2 (two) times daily.    Dispense:  90 tablet    Refill:  1   Ubrogepant  (UBRELVY ) 100 MG TABS    Sig: Take 1 tablet (100 mg total) by mouth every 2 (two) hours as needed. Maximum 200mg  a day.    Dispense:  16 tablet    Refill:  11    Patient has copay card; she can have medication regardless of insurance approval or copay amount.   topiramate  (TOPAMAX ) 50 MG tablet    Sig: Take 1 tablet (50 mg total) by  mouth 2 (two) times daily.    Dispense:  180 tablet    Refill:  2   tirzepatide  (MOUNJARO ) 10 MG/0.5ML Pen    Sig: Inject 10 mg into the skin once a week.    Dispense:  6 mL    Refill:  2   sildenafil  (VIAGRA ) 50 MG tablet    Sig: Take 1 tablet (50 mg total) by mouth daily as needed for erectile dysfunction.    Dispense:  10 tablet    Refill:  1   rosuvastatin  (CRESTOR ) 40 MG tablet    Sig: Take 1 tablet (40 mg total) by mouth daily.    Dispense:  90 tablet    Refill:  1    This prescription was filled on 11/15/2023. Any refills authorized will be placed on file.   nitroGLYCERIN  (NITROSTAT ) 0.4 MG SL tablet    Sig: Place 1 tablet (0.4 mg total) under the tongue every 5 (five) minutes as needed for chest pain.    Dispense:  90 tablet    Refill:  0   metoprolol  succinate (TOPROL -XL) 25 MG 24 hr tablet    Sig: Take 1 tablet (25 mg total) by mouth daily.    Dispense:  90 tablet    Refill:  0    This prescription was filled on 12/13/2023. Any refills authorized will be placed on file.   DISCONTD: pantoprazole  (PROTONIX )  20 MG tablet    Sig: Take 1 tablet (20 mg total) by mouth daily.    Dispense:  90 tablet    Refill:  1   pantoprazole  (PROTONIX ) 20 MG tablet    Sig: Take 1 tablet (20 mg total) by mouth 2 (two) times daily before a meal.    Dispense:  180 tablet    Refill:  1    Please dispense this TWICE DAILY dosing and not the prior script    No orders of the defined types were placed in this encounter.      Follow-up: Return in about 3 months (around 09/22/2024) for chronic disease follow up.  An After Visit Summary was printed and given to the patient.  Brynli Ollis, MD [facility] [phone]

## 2024-06-22 NOTE — Assessment & Plan Note (Addendum)
 HISTORY OF STROKE IN 2016. HEMIPLEGIA RESOLVED SINCE.  Orders:   clopidogrel  (PLAVIX ) 75 MG tablet; Take 1 tablet (75 mg total) by mouth daily.   losartan  (COZAAR ) 25 MG tablet; Take 1 tablet (25 mg total) by mouth daily.

## 2024-06-22 NOTE — Assessment & Plan Note (Addendum)
 Orders:    ARIPiprazole (ABILIFY) 5 MG tablet; Take 1 tablet (5 mg total) by mouth daily.

## 2024-06-22 NOTE — Assessment & Plan Note (Addendum)
 Type 2 diabetes mellitus, uncontrolled, with diabetic neuropathy WITHOUT CURRENT LONG TERM USE OF INSULIN  A1c decreased from 11.9% to 9.7%, indicating some improvement but remains uncontrolled. Current medications include Farxiga , metformin , and Mounjaro . Insulin  therapy deferred due to potential weight gain, but will be considered if A1c does not decrease below 9% in three months. Emphasis on dietary modifications and portion control, especially during upcoming holidays. - Continue Farxiga  10 mg, metformin  1000 mg twice daily, Mounjaro  10 mg daily. - Will reassess A1c in three months. - Encouraged dietary modifications and portion control. - Encouraged increased physical activity. - Will consider insulin  therapy if A1c does not decrease below 8% in three months. Orders:   Continuous Glucose Sensor (DEXCOM G7 SENSOR) MISC; Apply sensor every 10 days

## 2024-06-22 NOTE — Patient Instructions (Signed)
  VISIT SUMMARY: You had a follow-up visit to discuss your recent emergency room visit for chest pain and to review your ongoing health conditions, including diabetes, high triglycerides, coronary artery disease, high blood pressure, heartburn, and toenail fungus.  YOUR PLAN: TYPE 2 DIABETES MELLITUS, UNCONTROLLED, WITH DIABETIC NEUROPATHY: Your blood sugar levels have improved but are still not well controlled. -Continue taking Farxiga  10 mg daily, metformin  1000 mg twice daily, and Mounjaro  10 mg daily. -We will reassess your A1c in three months. -Focus on dietary modifications and portion control, especially during the holidays. -Increase your physical activity. -We may consider insulin  therapy if your A1c does not decrease below 8% in three months.  HYPERTRIGLYCERIDEMIA: Your triglyceride levels have improved but are still higher than the target. -Continue taking Tricor , Crestor , and Vascepa .  CORONARY ARTERY DISEASE WITH ANGINA PECTORIS: You had a recent episode of chest pain and shortness of breath, but tests showed no heart attack. -Continue taking aspirin , Plavix , metoprolol , and nitroglycerin . -Follow up with your cardiologist. -Contact your cardiologist if you do not see improvement. -Consider increasing your metoprolol  dosage as per your cardiologist's recommendation.  ESSENTIAL HYPERTENSION: Your blood pressure is being managed with medication. -Continue taking losartan  25 mg and metoprolol  XL 25 mg.  GASTROESOPHAGEAL REFLUX DISEASE: You have heartburn, and we are changing your medication to avoid interactions. -Stop taking omeprazole. -Start taking pantoprazole  (Protonix ) twice daily with meals.  ONYCHOMYCOSIS (TOENAIL FUNGUS): You have toenail fungus, but it is not severe. -Continue Epsom salt soaks, filing, and trimming your toenails.                      Contains text generated by [employer].                                  Contains text generated by [employer].

## 2024-06-22 NOTE — Assessment & Plan Note (Addendum)
 Gastroesophageal reflux disease Omeprazole not recommended due to interaction with Plavix . Switching to pantoprazole  (Protonix ) as an alternative. - Discontinued omeprazole. - Started pantoprazole  (Protonix ) 20 mg twice daily with meals.

## 2024-06-22 NOTE — Assessment & Plan Note (Addendum)
 Essential hypertension Blood pressure management ongoing with current medications including losartan  and metoprolol . - Continue losartan  25 mg and metoprolol  XL 25 mg. Orders:   metoprolol  succinate (TOPROL -XL) 25 MG 24 hr tablet; Take 1 tablet (25 mg total) by mouth daily.

## 2024-07-16 ENCOUNTER — Ambulatory Visit

## 2024-07-16 VITALS — BP 110/70 | HR 62 | Temp 97.3°F | Resp 16 | Ht 69.0 in | Wt 257.0 lb

## 2024-07-16 DIAGNOSIS — E1165 Type 2 diabetes mellitus with hyperglycemia: Secondary | ICD-10-CM

## 2024-07-16 DIAGNOSIS — J320 Chronic maxillary sinusitis: Secondary | ICD-10-CM | POA: Insufficient documentation

## 2024-07-16 DIAGNOSIS — Z7984 Long term (current) use of oral hypoglycemic drugs: Secondary | ICD-10-CM

## 2024-07-16 DIAGNOSIS — Z794 Long term (current) use of insulin: Secondary | ICD-10-CM

## 2024-07-16 MED ORDER — LANTUS SOLOSTAR 100 UNIT/ML ~~LOC~~ SOPN: 12.0000 [IU] | PEN_INJECTOR | Freq: Every day | SUBCUTANEOUS | 3 refills | Status: AC

## 2024-07-16 MED ORDER — AMOXICILLIN 500 MG PO CAPS: 500.0000 mg | ORAL_CAPSULE | Freq: Three times a day (TID) | ORAL | 0 refills | Status: AC

## 2024-07-16 MED ORDER — PEN NEEDLES 32G X 4 MM MISC: 0 refills | Status: DC

## 2024-07-16 NOTE — Assessment & Plan Note (Signed)
°  Type 2 diabetes mellitus, uncontrolled with complications of hyperglycemia WITHOUT current long term use of insulin .  Blood glucose levels range from 252 to 400 mg/dL. Last hemoglobin A1c was 9.7%, improved from 11.9%. Current regimen includes Farxiga , metformin , and Mounjaro . Insulin  therapy considered due to high blood glucose levels. Discussed potential weight gain with insulin  use but emphasized need for glycemic control. - Initiated Lantus  Solastar pen at 5 units at bedtime, with instructions to increase to 7, 9, or 10 units based on blood glucose levels every three days. - Educated on insulin  administration, including changing needle and injection site with each use. - Advised on healthy eating and increased physical activity, especially during the holidays. - Scheduled follow-up appointment in January or February.

## 2024-07-16 NOTE — Patient Instructions (Signed)
°  VISIT SUMMARY: Today, you were seen for persistent cold and congestion symptoms lasting two weeks, and we also discussed your diabetes management.  YOUR PLAN: LEFT MAXILLARY SINUSITIS: You have sinus pressure and congestion that has been ongoing for two weeks, extending to your throat and chest. There is no fever, but you have experienced cold chills. -Use steam inhalation and saline drops to help with nasal congestion. -If your symptoms worsen or do not improve in a day or two, start the prescribed antibiotic.  TYPE 2 DIABETES MELLITUS WITH HYPERGLYCEMIA: Your blood glucose levels have been high, ranging from 252 to 400 mg/dL. Your last hemoglobin A1c was 9.7%, which is an improvement from 11.9%. -Start using the Lantus  Solastar pen at 5 units at bedtime. Increase the dose to 7, 9, or 10 units based on your blood glucose levels every three days. -Make sure to change the needle and injection site with each use. -Continue with healthy eating and increase your physical activity, especially during the holidays. -We will have a follow-up appointment in January or February to monitor your progress.                      Contains text generated by [employer].                                 Contains text generated by [employer].

## 2024-07-16 NOTE — Progress Notes (Signed)
 Acute Office Visit  Subjective:     Patient ID: Jared Wood, male    DOB: 09/02/1965, 58 y.o.   MRN: [id number]  Chief Complaint  Patient presents with   Diabetes   chest congestion   Sinusitis    HPI Discussed the use of AI scribe software for clinical note transcription with the patient, who gave verbal consent to proceed.  History of Present Illness   Jared Wood is a 58 year old male with diabetes who presents with persistent cold and congestion symptoms for two weeks.  Upper and lower respiratory symptoms - Cold and congestion symptoms persistent for two weeks - Sinus pressure radiating to throat and chest - Cough productive of sputum - No fever - Cold chills present - Thick mucus drainage from nose - Dry mouth  Otic symptoms - Mild left ear discomfort, not severe  Glycemic control - Diabetes with elevated blood glucose levels ranging from 252 to 400 mg/dL - Current medications: Farxiga  10 mg daily, metformin  2000 mg daily, Mounjaro  2 mg weekly - Last hemoglobin A1c 9.7%, improved from 11.9% - Concern regarding high blood sugar and desire to avoid returning to insulin  therapy - Dietary management ongoing  Medication use - Completed two bottles of Nyquil HBP for symptom relief - Using Ventolin albuterol inhaler - Using Xyzal (levocetirizine) for allergy relief  Allergies - No known allergies       Review of Systems  Constitutional:  Negative for malaise/fatigue.  HENT:  Positive for congestion and sinus pain.   Respiratory:  Positive for cough and sputum production. Negative for wheezing.   Cardiovascular:  Negative for chest pain.        Objective:    BP 110/70   Pulse 62   Temp (!) 97.3 F (36.3 C)   Resp 16   Ht 5' 9 (1.753 m)   Wt 257 lb (116.6 kg)   SpO2 98%   BMI 37.95 kg/m    Physical Exam Vitals and nursing note reviewed.  Constitutional:      Appearance: He is obese.  HENT:     Head: Normocephalic and  atraumatic.     Nose: Congestion present.     Comments: Left maxillary sinus tenderness    Mouth/Throat:     Comments: Mild posterior pharyngeal wall erythema and post nasal drainage Eyes:     Pupils: Pupils are equal, round, and reactive to light.  Cardiovascular:     Rate and Rhythm: Normal rate and regular rhythm.  Pulmonary:     Effort: Pulmonary effort is normal.     Breath sounds: Normal breath sounds.  Neurological:     General: No focal deficit present.     Mental Status: He is alert and oriented to person, place, and time.  Psychiatric:        Mood and Affect: Mood normal.     No results found for any visits on 07/16/24.      Assessment & Plan:   Problem List Items Addressed This Visit     Uncontrolled type 2 diabetes mellitus with hyperglycemia (HCC) - Primary    Type 2 diabetes mellitus, uncontrolled with complications of hyperglycemia WITHOUT current long term use of insulin .  Blood glucose levels range from 252 to 400 mg/dL. Last hemoglobin A1c was 9.7%, improved from 11.9%. Current regimen includes Farxiga , metformin , and Mounjaro . Insulin  therapy considered due to high blood glucose levels. Discussed potential weight gain with insulin  use but emphasized need for glycemic control. -  Initiated Lantus  Solastar pen at 5 units at bedtime, with instructions to increase to 7, 9, or 10 units based on blood glucose levels every three days. - Educated on insulin  administration, including changing needle and injection site with each use. - Advised on healthy eating and increased physical activity, especially during the holidays. - Scheduled follow-up appointment in January or February.       Relevant Medications   insulin  glargine (LANTUS  SOLOSTAR) 100 UNIT/ML Solostar Pen   Left maxillary sinusitis   Left maxillary sinusitis Sinus pressure and congestion persisting for two weeks, with symptoms extending to the throat and chest. No fever, but cold chills present.  Examination reveals no pneumonia or abnormal lung sounds. Oxygen levels are normal. Sinus drainage expected to improve without antibiotics initially. - Advised steam inhalation and saline drops for nasal congestion. He does have left maxillary sinus tenderness. Since it has been 2 weeks since symptom onset, advised to give it 1-2 more days with saline nasal spray/ steam inhalation and if no relief, to go ahead and start the antibiotic.  - Prescribed antibiotic AMOXICILLIN  if symptoms worsen or do not improve in a day or two.      Relevant Medications   amoxicillin  (AMOXIL ) 500 MG capsule            Meds ordered this encounter  Medications   insulin  glargine (LANTUS  SOLOSTAR) 100 UNIT/ML Solostar Pen    Sig: Inject 12 Units into the skin at bedtime.    Dispense:  15 mL    Refill:  3   amoxicillin  (AMOXIL ) 500 MG capsule    Sig: Take 1 capsule (500 mg total) by mouth 3 (three) times daily for 7 days.    Dispense:  21 capsule    Refill:  0   Insulin  Pen Needle (PEN NEEDLES) 32G X 4 MM MISC    Sig: For Lantus  solostar pen    Dispense:  90 each    Refill:  0    No follow-ups on file.  Jared Fontenot, MD

## 2024-07-16 NOTE — Assessment & Plan Note (Signed)
 Left maxillary sinusitis Sinus pressure and congestion persisting for two weeks, with symptoms extending to the throat and chest. No fever, but cold chills present. Examination reveals no pneumonia or abnormal lung sounds. Oxygen levels are normal. Sinus drainage expected to improve without antibiotics initially. - Advised steam inhalation and saline drops for nasal congestion. He does have left maxillary sinus tenderness. Since it has been 2 weeks since symptom onset, advised to give it 1-2 more days with saline nasal spray/ steam inhalation and if no relief, to go ahead and start the antibiotic.  - Prescribed antibiotic AMOXICILLIN  if symptoms worsen or do not improve in a day or two.

## 2024-07-24 ENCOUNTER — Other Ambulatory Visit: Payer: Self-pay

## 2024-07-30 ENCOUNTER — Ambulatory Visit: Payer: Self-pay

## 2024-07-30 NOTE — Telephone Encounter (Signed)
 FYI Only or Action Required?: FYI only for provider: appointment scheduled on tomorrow morning.  Patient was last seen in primary care on 07/16/2024 by Sirivol, Mamatha, MD.  Called Nurse Triage reporting Blood Sugar Problem.  Symptoms began ongoing.  Interventions attempted: Prescription medications: as prescribed.  Symptoms are: unchanged.  Triage Disposition: Call PCP Now  Patient/caregiver understands and will follow disposition?: Yes - Will will go to ED if blood sugar goes over 500 or other s/s occur.                    Copied from CRM #[id number]. Topic: Clinical - Red Word Triage >> Jul 30, 2024  3:30 PM Emylou G wrote: [facility] that prompted transfer to Nurse Triage: sugar at night 300 + and during the day 400 + Isn't feeling well .SABRA He is new on insulin  since 12/15 .SABRA Only ate breakfast today so far .SABRA Right now its 344 >> Jul 30, 2024  5:52 PM Dedra B wrote: Pt didn't hear back from clinic regarding whether or not he should go to ER. Warm transfer to NT Reason for Disposition  Blood glucose > 400 mg/dL (77.7 mmol/L)  Answer Assessment - Initial Assessment Questions 1. BLOOD GLUCOSE: What is your blood glucose level?      Above 400 right now 2. ONSET: When did you check the blood glucose?     Now - 427 3. USUAL RANGE: What is your glucose level usually? (e.g., usual fasting morning value, usual evening value)     300 - 400 - several weeks 4. KETONES: Do you check for ketones (urine or blood test strips)? If Yes, ask: What does the test show now?      Does not test 5. TYPE 1 or 2:  Do you know what type of diabetes you have?  (e.g., Type 1, Type 2, Gestational; doesn't know)      Type 2 6. INSULIN : Do you take insulin ? What type of insulin (s) do you use? What is the mode of delivery? (syringe, pen; injection or pump)?      Yes - 12 u 7. DIABETES PILLS: Do you take any pills for your diabetes? If Yes, ask: Have you missed taking any  pills recently?     Yes - has not missed 8. OTHER SYMPTOMS: Do you have any symptoms? (e.g., fever, frequent urination, difficulty breathing, dizziness, weakness, vomiting)     Mouth is dry  Protocols used: Diabetes - High Blood Sugar-A-AH
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
## 2024-07-31 ENCOUNTER — Encounter: Payer: Self-pay | Admitting: Physician Assistant

## 2024-07-31 ENCOUNTER — Ambulatory Visit: Admitting: Physician Assistant

## 2024-07-31 ENCOUNTER — Telehealth: Payer: Self-pay

## 2024-07-31 VITALS — BP 116/62 | HR 70 | Temp 97.8°F | Ht 69.0 in | Wt 258.0 lb

## 2024-07-31 DIAGNOSIS — E1165 Type 2 diabetes mellitus with hyperglycemia: Secondary | ICD-10-CM

## 2024-07-31 DIAGNOSIS — R0602 Shortness of breath: Secondary | ICD-10-CM | POA: Diagnosis not present

## 2024-07-31 MED ORDER — GVOKE HYPOPEN 2-PACK 0.5 MG/0.1ML ~~LOC~~ SOAJ: 1.0000 | SUBCUTANEOUS | 1 refills | Status: DC | PRN

## 2024-07-31 MED ORDER — INSULIN LISPRO 200 UNIT/ML ~~LOC~~ SOPN: 4.0000 [IU] | PEN_INJECTOR | Freq: Three times a day (TID) | SUBCUTANEOUS | 3 refills | Status: DC

## 2024-07-31 NOTE — ED Triage Notes (Signed)
 Pt was sent by PCP for hyperglycemia blood sugar in 300s and 400s for several weeks. Pt also short of breath in triage.

## 2024-07-31 NOTE — Progress Notes (Signed)
 "  Acute Office Visit  Subjective:    Patient ID: Jared Wood, male    DOB: 1965-09-26, 58 y.o.   MRN: [id number]  Chief Complaint  Patient presents with   Hyperglycemia    HPI: Patient is in today for elevated sugar, dizziness, fatigue, weakness  Discussed the use of AI scribe software for clinical note transcription with the patient, who gave verbal consent to proceed.  Discussed the use of AI scribe software for clinical note transcription with the patient, who gave verbal consent to proceed.  History of Present Illness Jared Wood is a 58 year old male with diabetes who presents with elevated blood sugars.  He has been experiencing elevated blood sugars, consistently ranging from 300-400 mg/dL, despite being on multiple medications including Mounjaro  10 mg weekly, Farxiga  10mg , metformin  2000 mg daily, and Lantus  12 units at night. He reports that his blood sugars remain high, and that his food intake has included a bowl of cereal and a bacon and egg burrito. His Dexcom monitor shows an average blood sugar of about 302 mg/dL over the past two weeks.  He experiences symptoms of dizziness, blurred vision, and shortness of breath. He also reports significant fatigue, stating that all he wants to do is sleep, and has noticed swelling in his feet and lower legs. No recent chest pain, but he has a history of a heart attack in October 2023 and a stroke in September 2016 or 2017. He was admitted to the hospital in November for chest pain, thought to be angina.  He has a history of asthma and uses an albuterol inhaler, though he hasn't used it recently. He denies any new mouth sores or pain. He quit smoking at least ten years ago but uses tobacco pouches. He reports frequent urination and excessive thirst, drinking large amounts of water daily.       Past Medical History:  Diagnosis Date   Alteration of awareness 06/17/2019   Anxiety    Arthritis    Chronic migraine  without aura, with intractable migraine, so stated, with status migrainosus 06/17/2019   Chronic pain of both knees 07/24/2015   Conversion disorder    Conversion disorder with abnormal movement, acute episode, without psychological stressor 04/12/2015   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   Depression    Diabetes mellitus without complication (HCC)    Type 2   Essential hypertension 04/07/2015   GERD (gastroesophageal reflux disease)    Hard of hearing    Headache 06/30/2015   Heart attack (HCC) 05/2022   Heart disease    Hemiplegic migraine    History of bleeding ulcers    History of Clostridium difficile colitis    History of kidney stones    Hyperlipemia    Hypertension    Left-sided weakness    Migraine    Migraine with aura and without status migrainosus, not intractable 06/17/2019   Mixed hyperlipidemia 04/07/2015   Neuropathy    Pain in joint of left shoulder 07/24/2015   PONV (postoperative nausea and vomiting)    Nausea   Primary osteoarthritis of left knee 08/14/2015   S/P total knee replacement 12/01/2015   Skin cancer 2019   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/2015   Overview:  Most likely explanation for her symptoms at admission   Status post total right knee replacement 12/16/2015   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 was given TPA and underwent rehabilitation.   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 (HCC) 04/07/2015    Past Surgical History:  Procedure Laterality Date   ADENOIDECTOMY     APPENDECTOMY  2002   CHOLECYSTECTOMY     2003 or 2004   COLONOSCOPY  01/17/2017   Small internal hemorrhoids. Other wise normal colonoscopy   COLONOSCOPY W/ ENDOSCOPIC US      CORONARY ANGIOPLASTY  2008   CORONARY ANGIOPLASTY WITH STENT PLACEMENT  05/2022   ESOPHAGOGASTRODUODENOSCOPY  09/20/2011   Mild gastritis.  Otherwise normal esophagogastroduodenoscopy   HAND SURGERY Left 2001   hand reconstruction   HERNIA REPAIR     LITHOTRIPSY     SHOULDER ARTHROSCOPY WITH OPEN ROTATOR CUFF REPAIR Right    TONSILLECTOMY  1972   TOTAL KNEE ARTHROPLASTY Right 12/01/2015   Procedure: TOTAL KNEE ARTHROPLASTY;  Surgeon: Marcey Raman, MD;  Location: [facility] OR;  Service: Orthopedics;  Laterality: Right;    Family History  Problem Relation Age of Onset   Myocarditis Mother    Prostate cancer Father    Liver cancer Father    Kidney cancer Father    Other Father        lymph nodes   COPD Father    Emphysema Father    Congestive Heart Failure Father    High blood pressure Father    Arthritis Father    Migraines Neg Hx     Social History   Socioeconomic History   Marital status: Married    Spouse name: Not on file   Number of children: 3   Years of education: 2 yrs college   Highest education level: Not on file  Occupational History   Not on file  Tobacco Use   Smoking status: Former   Smokeless tobacco: Current    Types: Chew    Last attempt to quit: 11/2021   Tobacco comments:    uses 1 can/day  Vaping Use   Vaping status: Former  Substance and Sexual Activity   Alcohol use: Yes    Comment: occasional   Drug use: No    Comment: quit hard drugs in 1990   Sexual activity: Yes    Partners: Female  Other Topics Concern   Not on file  Social History Narrative   Lives at home with spouse   Caffeine: not as much as I used to, can drink up to 2-3 cups/day   Social Drivers of Health   Tobacco Use: High Risk (07/31/2024)   Patient History    Smoking Tobacco Use: Former    Smokeless Tobacco Use: Current    Passive Exposure: Not on Actuary Strain: Not on file  Food Insecurity: Low Risk (09/29/2022)   Received from [facility]   Epic    Within the past 12 months, you worried that your food would run out before you got money to buy more: Never true    Within the past 12  months, the food you bought just didn't last and you didn't have money to get more: Not on file  Transportation Needs: No Transportation Needs (09/29/2022)   Received from [facility] visits prior to 10/02/2022.   Transportation    In the past 12 months, has lack of reliable transportation kept you from medical appointments, meetings, work or from getting things needed for daily living?: No  Physical Activity: Not on file  Stress: Not on file  Social Connections: Not on file  Intimate Partner Violence: Low Risk  (09/29/2022)   Received from [facility] visits prior to 10/02/2022.   Safety    How often does anyone, including family and friends, physically hurt you?: Never    How often does anyone, including family and friends, insult or talk down to you?: Never    How often does anyone, including family and friends, threaten you with harm?: Never    How often does anyone, including family and friends, scream or curse at you?: Never  Depression (PHQ2-9): Low Risk (04/09/2024)   Depression (PHQ2-9)    PHQ-2 Score: 0  Alcohol Screen: Low Risk (08/18/2023)   Alcohol Screen    Last Alcohol Screening Score (AUDIT): 0  Housing: Low Risk (09/29/2022)   Received from [facility]   Epic    What is your living situation today?: I have a steady place to live    Think about the place you live. Do you have problems with any of the following? Choose all that apply:: None/None on this list  Utilities: Low Risk (09/29/2022)   Received from [facility]   Utilities    In the past 12 months has the electric, gas, oil, or water company threatened to shut off services in your home? : No  Health Literacy: Not on file    Outpatient Medications Prior to Visit  Medication Sig Dispense Refill   albuterol (VENTOLIN HFA) 108 (90 Base) MCG/ACT inhaler Inhale 2 puffs into the lungs.     ARIPiprazole  (ABILIFY ) 5 MG tablet Take 1 tablet (5 mg total) by mouth daily. 90 tablet 1    aspirin  EC 81 MG tablet Take 81 mg by mouth once.     clopidogrel  (PLAVIX ) 75 MG tablet Take 1 tablet (75 mg total) by mouth daily. 90 tablet 1   Continuous Glucose Sensor (DEXCOM G7 SENSOR) MISC Apply sensor every 10 days 3 each 0   dapagliflozin  propanediol (FARXIGA ) 10 MG TABS tablet Take 1 tablet (10 mg total) by mouth daily. 90 tablet 1   dicyclomine  (BENTYL ) 10 MG capsule Take 1 capsule (10 mg total) by mouth in the morning and at bedtime. 60 capsule 2   escitalopram  (LEXAPRO ) 20 MG tablet Take 1 tablet (20 mg total) by mouth daily. 90 tablet 1   fenofibrate  (TRICOR ) 145 MG tablet TAKE ONE TABLET BY MOUTH DAILY 30 tablet 2   furosemide  (LASIX ) 20 MG tablet Take 1 tablet (20 mg total) by mouth daily. 30 tablet 2   gabapentin  (NEURONTIN ) 300 MG capsule Take 1 capsule (300 mg total) by mouth 3 (three) times daily. 270 capsule 1   icosapent  Ethyl (VASCEPA ) 1 g capsule Take 2 capsules (2 g total) by mouth 2 (two) times daily. 120 capsule 2   insulin  glargine (LANTUS  SOLOSTAR) 100 UNIT/ML Solostar Pen Inject 12 Units into the skin at bedtime. 15 mL 3   Insulin  Pen Needle (PEN NEEDLES) 32G X 4 MM MISC For Lantus  solostar pen 90 each 0   levocetirizine (XYZAL) 5 MG tablet Take 5 mg by mouth at bedtime.     loratadine-pseudoephedrine (CLARITIN-D 24-HOUR) 10-240 MG 24 hr tablet Take 1 tablet by mouth daily.     losartan  (COZAAR ) 25 MG tablet Take 1 tablet (25 mg total) by mouth daily. 90 tablet 1   metFORMIN  (GLUCOPHAGE ) 1000 MG tablet Take 1 tablet (1,000 mg total) by mouth 2 (two) times daily. 90 tablet 1   metoprolol  succinate (TOPROL -XL) 25 MG  24 hr tablet Take 1 tablet (25 mg total) by mouth daily. 90 tablet 0   Multiple Vitamins-Minerals (CENTRUM MEN) TABS Take 1 tablet by mouth daily with breakfast.     nitroGLYCERIN  (NITROSTAT ) 0.4 MG SL tablet Place 1 tablet (0.4 mg total) under the tongue every 5 (five) minutes as needed for chest pain. 90 tablet 0   pantoprazole  (PROTONIX ) 20 MG tablet  Take 1 tablet (20 mg total) by mouth 2 (two) times daily before a meal. 180 tablet 1   rosuvastatin  (CRESTOR ) 40 MG tablet Take 1 tablet (40 mg total) by mouth daily. 90 tablet 1   sildenafil  (VIAGRA ) 50 MG tablet Take 1 tablet (50 mg total) by mouth daily as needed for erectile dysfunction. 10 tablet 1   tirzepatide  (MOUNJARO ) 10 MG/0.5ML Pen Inject 10 mg into the skin once a week. 6 mL 2   topiramate  (TOPAMAX ) 50 MG tablet Take 1 tablet (50 mg total) by mouth 2 (two) times daily. 180 tablet 2   Ubrogepant  (UBRELVY ) 100 MG TABS Take 1 tablet (100 mg total) by mouth every 2 (two) hours as needed. Maximum 200mg  a day. 16 tablet 11   No facility-administered medications prior to visit.    Allergies[1]  Review of Systems  Constitutional:  Positive for fatigue. Negative for appetite change and fever.  HENT:  Negative for congestion, ear pain, sinus pressure and sore throat.   Eyes:  Positive for visual disturbance.  Respiratory:  Positive for shortness of breath. Negative for cough, chest tightness and wheezing.   Cardiovascular:  Negative for chest pain and palpitations.  Gastrointestinal:  Negative for abdominal pain, constipation, diarrhea, nausea and vomiting.  Genitourinary:  Negative for dysuria and hematuria.  Musculoskeletal:  Negative for arthralgias, back pain, joint swelling and myalgias.  Skin:  Negative for rash.  Neurological:  Negative for dizziness, weakness and headaches.  Psychiatric/Behavioral:  Negative for dysphoric mood. The patient is not nervous/anxious.        Objective:        07/16/2024    2:03 PM 06/22/2024    7:55 AM 05/07/2024    8:09 AM  Vitals with BMI  Height 5' 9 5' 9 5' 9  Weight 257 lbs 254 lbs 253 lbs 6 oz  BMI 37.93 37.49 37.4  Systolic 110 110 895  Diastolic 70 60 60  Pulse 62 63 72    No data found.   Physical Exam Vitals reviewed.  Constitutional:      General: He is in acute distress.     Appearance: Normal appearance. He is  diaphoretic.  Cardiovascular:     Rate and Rhythm: Normal rate and regular rhythm.     Heart sounds: Normal heart sounds.  Pulmonary:     Effort: Pulmonary effort is normal.     Breath sounds: Normal breath sounds.  Abdominal:     Palpations: Abdomen is soft.     Tenderness: There is no abdominal tenderness. There is no right CVA tenderness or left CVA tenderness.  Neurological:     Mental Status: He is alert and oriented to person, place, and time.  Psychiatric:        Mood and Affect: Mood normal.        Behavior: Behavior normal.     Health Maintenance Due  Topic Date Due   OPHTHALMOLOGY EXAM  03/14/2024   COVID-19 Vaccine (4 - 2025-26 season) 04/02/2024    There are no preventive care reminders to display for this patient.  Lab Results  Component Value Date   TSH 2.050 05/17/2023   Lab Results  Component Value Date   WBC 6.6 06/21/2024   HGB 13.4 06/21/2024   HCT 40.9 06/21/2024   MCV 84 06/21/2024   PLT 245 06/21/2024   Lab Results  Component Value Date   NA 140 06/21/2024   K 4.1 06/21/2024   CO2 21 06/21/2024   GLUCOSE 190 (H) 06/21/2024   BUN 15 06/21/2024   CREATININE 0.99 06/21/2024   BILITOT 0.4 06/21/2024   ALKPHOS 43 (L) 06/21/2024   AST 27 06/21/2024   ALT 32 06/21/2024   PROT 6.9 06/21/2024   ALBUMIN 4.3 06/21/2024   CALCIUM  9.4 06/21/2024   ANIONGAP 8 04/21/2021   EGFR 88 06/21/2024   GFR 95.74 07/06/2022   Lab Results  Component Value Date   CHOL 109 06/21/2024   Lab Results  Component Value Date   HDL 31 (L) 06/21/2024   Lab Results  Component Value Date   LDLCALC 33 06/21/2024   Lab Results  Component Value Date   TRIG 299 (H) 06/21/2024   Lab Results  Component Value Date   CHOLHDL 3.5 06/21/2024   Lab Results  Component Value Date   HGBA1C 9.7 (H) 06/21/2024        Results for orders placed or performed in visit on 06/21/24  CBC with Differential/Platelet   Collection Time: 06/21/24  8:08 AM  Result Value  Ref Range   WBC 6.6 3.4 - 10.8 x10E3/uL   RBC 4.86 4.14 - 5.80 x10E6/uL   Hemoglobin 13.4 13.0 - 17.7 g/dL   Hematocrit 59.0 62.4 - 51.0 %   MCV 84 79 - 97 fL   MCH 27.6 26.6 - 33.0 pg   MCHC 32.8 31.5 - 35.7 g/dL   RDW 85.7 88.3 - 84.5 %   Platelets 245 150 - 450 x10E3/uL   Neutrophils 61 Not Estab. %   Lymphs 27 Not Estab. %   Monocytes 7 Not Estab. %   Eos 4 Not Estab. %   Basos 1 Not Estab. %   Neutrophils Absolute 4.0 1.4 - 7.0 x10E3/uL   Lymphocytes Absolute 1.8 0.7 - 3.1 x10E3/uL   Monocytes Absolute 0.5 0.1 - 0.9 x10E3/uL   EOS (ABSOLUTE) 0.3 0.0 - 0.4 x10E3/uL   Basophils Absolute 0.1 0.0 - 0.2 x10E3/uL   Immature Granulocytes 0 Not Estab. %   Immature Grans (Abs) 0.0 0.0 - 0.1 x10E3/uL  Comprehensive metabolic panel with GFR   Collection Time: 06/21/24  8:08 AM  Result Value Ref Range   Glucose 190 (H) 70 - 99 mg/dL   BUN 15 6 - 24 mg/dL   Creatinine, Ser 9.00 0.76 - 1.27 mg/dL   eGFR 88 >40 fO/fpw/8.26   BUN/Creatinine Ratio 15 9 - 20   Sodium 140 134 - 144 mmol/L   Potassium 4.1 3.5 - 5.2 mmol/L   Chloride 102 96 - 106 mmol/L   CO2 21 20 - 29 mmol/L   Calcium  9.4 8.7 - 10.2 mg/dL   Total Protein 6.9 6.0 - 8.5 g/dL   Albumin 4.3 3.8 - 4.9 g/dL   Globulin, Total 2.6 1.5 - 4.5 g/dL   Bilirubin Total 0.4 0.0 - 1.2 mg/dL   Alkaline Phosphatase 43 (L) 47 - 123 IU/L   AST 27 0 - 40 IU/L   ALT 32 0 - 44 IU/L  Hemoglobin A1c   Collection Time: 06/21/24  8:08 AM  Result Value Ref Range   Hgb A1c  MFr Bld 9.7 (H) 4.8 - 5.6 %   Est. average glucose Bld gHb Est-mCnc 232 mg/dL  Lipid panel   Collection Time: 06/21/24  8:08 AM  Result Value Ref Range   Cholesterol, Total 109 100 - 199 mg/dL   Triglycerides 700 (H) 0 - 149 mg/dL   HDL 31 (L) >60 mg/dL   VLDL Cholesterol Cal 45 (H) 5 - 40 mg/dL   LDL Chol Calc (NIH) 33 0 - 99 mg/dL   Chol/HDL Ratio 3.5 0.0 - 5.0 ratio  Total time spent on today's visit was 40 minutes, including both face-to-face time and  nonface-to-face time personally spent on review of chart (labs and imaging), discussing labs and goals, discussing further work-up, treatment options, referrals to specialist if needed, reviewing outside records of pertinent, answering patient's questions, and coordinating care.    Assessment & Plan:   Assessment & Plan Uncontrolled type 2 diabetes mellitus with hyperglycemia (HCC) Uncontrolled type 2 diabetes mellitus with hyperglycemia Severe hyperglycemia with glucose levels 300-400 mg/dL, averaging 697 mg/dL. Symptoms include dizziness, blurred vision, shortness of breath, and polydipsia. Current medications: Mounjaro , Farxiga , metformin , Lantus . No diabetic ketoacidosis or hyperosmolar hyperglycemic state. Risk of pancreatitis. Considered insulin  resistance and endocrinology referral. Decided on hospital management due to persistent symptoms and high glucose levels. - Initiated short-acting insulin  4 units before meals, adjust based on glucose levels. - Prescribed Gvoq pen for emergency hypoglycemia. - Referred to endocrinology for insulin  resistance evaluation. - Advised hospital admission for aggressive hyperglycemia management and symptom monitoring. - Notified [facility] for expedited care. - Plan hospital follow-up with Doctor Sirivol within five days post-discharge. Orders:   insulin  lispro (HUMALOG) 200 UNIT/ML KwikPen; Inject 4 Units into the skin 3 (three) times daily before meals.   Ambulatory referral to Endocrinology   Glucagon (GVOKE HYPOPEN 2-PACK) 0.5 MG/0.1ML SOAJ; Inject 1 Pen into the skin as needed (severe hypoglycemia).     There is no height or weight on file to calculate BMI..   No orders of the defined types were placed in this encounter.   No orders of the defined types were placed in this encounter.    Follow-up: No follow-ups on file.  An After Visit Summary was printed and given to the patient.   I,Lauren M Auman,acting as a neurosurgeon for [employer], PA.,have documented all relevant documentation on the behalf of Nola Angles, PA,as directed by  Nola Angles, PA while in the presence of Nola Angles, [place].    Nola Angles, [place] [facility] [phone]      [1]  Allergies Allergen Reactions   Meperidine Anaphylaxis and Other (See Comments)    Confusion and behavioral changes (makes patient things negative things)     Latex Rash   Tape Rash and Other (See Comments)    Can stay on for ONLY a limited length of time   "

## 2024-07-31 NOTE — Assessment & Plan Note (Addendum)
 Uncontrolled type 2 diabetes mellitus with hyperglycemia Severe hyperglycemia with glucose levels 300-400 mg/dL, averaging 697 mg/dL. Symptoms include dizziness, blurred vision, shortness of breath, and polydipsia. Current medications: Mounjaro , Farxiga , metformin , Lantus . No diabetic ketoacidosis or hyperosmolar hyperglycemic state. Risk of pancreatitis. Considered insulin  resistance and endocrinology referral. Decided on hospital management due to persistent symptoms and high glucose levels. - Initiated short-acting insulin  4 units before meals, adjust based on glucose levels. - Prescribed Gvoq pen for emergency hypoglycemia. - Referred to endocrinology for insulin  resistance evaluation. - Advised hospital admission for aggressive hyperglycemia management and symptom monitoring. - Notified [facility] for expedited care. - Plan hospital follow-up with Doctor Sirivol within five days post-discharge. Orders:   insulin  lispro (HUMALOG) 200 UNIT/ML KwikPen; Inject 4 Units into the skin 3 (three) times daily before meals.   Ambulatory referral to Endocrinology   Glucagon (GVOKE HYPOPEN 2-PACK) 0.5 MG/0.1ML SOAJ; Inject 1 Pen into the skin as needed (severe hypoglycemia).

## 2024-07-31 NOTE — Telephone Encounter (Signed)
 Patient scheduled 12/30 and seen by brady craft pac.  Sent to ed. Dr. Sherre

## 2024-07-31 NOTE — Telephone Encounter (Signed)
 Copied from CRM [id number]. Topic: Clinical - Prescription Issue >> Jul 31, 2024 10:43 AM Shanda MATSU wrote: Reason for CRM: Melissa w/[facility] called in to adv that they are not able to order med, Glucagon (GVOKE HYPOPEN 2-PACK) 0.5 MG/0.1ML SOAJ, caller did adv that they are able to order Glucagon Emergency Kit in dosage amt of 1mg , caller wants to know what provider would like to do, is req a call back.

## 2024-08-01 ENCOUNTER — Ambulatory Visit: Payer: Self-pay

## 2024-08-01 NOTE — Telephone Encounter (Signed)
 FYI Only or Action Required?: Action required by provider: clinical question for provider.  Patient was last seen in primary care on 07/31/2024 by Milon Cleaves, PA.  Called Nurse Triage reporting Advice Only.  Symptoms began today.  Triage Disposition: Information or Advice Only Call  Patient/caregiver understands and will follow disposition?: No, wishes to speak with PCP   Copied from CRM #[id number]. Topic: Clinical - Prescription Issue >> Aug 01, 2024 12:55 PM Kevelyn M wrote: Reason for CRM: Patient is calling in because the pharmacy cannot get insulin  pen Glucagon in the mg/dosing prescribed. Pharmacy is asking for a different pen or either a change in the mg. Please advise.  Call back #[phone] Reason for Disposition  Health information question, no triage required and triager able to answer question  Answer Assessment - Initial Assessment Questions 1. REASON FOR CALL: What is the main reason for your call? or How can I best help you?    Pt called stated the Glucagon pen that was ordered was not able to be filled  by the patient's pharmacy.  Pt stated his pharmacy stated either the dosage needs to be changed or a different pen needs to be ordered.  Please update order with patients pharmacy and/ or call pt to inform of any / updates on medication and possibly discuss sending to different pharmacy if needed.  Protocols used: Information Only Call - No Triage-A-AH

## 2024-08-03 ENCOUNTER — Telehealth: Payer: Self-pay

## 2024-08-03 ENCOUNTER — Other Ambulatory Visit: Payer: Self-pay

## 2024-08-03 NOTE — Telephone Encounter (Signed)
 Called patient and he is fine with emergency insulin .

## 2024-08-03 NOTE — Telephone Encounter (Signed)
 Copied from CRM [id number]. Topic: Clinical - Prescription Issue >> Jul 31, 2024 10:43 AM Shanda MATSU wrote: Reason for CRM: Melissa w/[facility] called in to adv that they are not able to order med, Glucagon (GVOKE HYPOPEN 2-PACK) 0.5 MG/0.1ML SOAJ, caller did adv that they are able to order Glucagon Emergency Kit in dosage amt of 1mg , caller wants to know what provider would like to do, is req a call back. >> Aug 03, 2024 11:23 AM Sophia H wrote: Patient is following back up on request, I asked if he had another pharmacy in mind per the note in chart from PA Nola Angles We will send it to a different pharmacy then as the Emergency kits generally require you to draw up the amount and inject instead of simply inject. Patient became frustrated. Stated he didn't care, he was told by pharmacy it is a pen and he wouldn't have to draw anything up. He does not want to switch pharmacies. Patient is requesting clinic reach out to pharmacy and find out what pen they are holding for him as he does not know the name and then send an RX in for that medication. Patient disconnected the call when placed on hold so I could reach out to CAL.

## 2024-08-06 DIAGNOSIS — E1149 Type 2 diabetes mellitus with other diabetic neurological complication: Secondary | ICD-10-CM

## 2024-08-06 MED ORDER — GLUCAGON EMERGENCY 1 MG/ML IJ SOLR: 1.0000 | INTRAMUSCULAR | 3 refills | Status: AC | PRN

## 2024-08-15 ENCOUNTER — Other Ambulatory Visit: Payer: Self-pay

## 2024-08-15 MED ORDER — PEN NEEDLES 32G X 4 MM MISC: 0 refills | Status: AC

## 2024-08-15 NOTE — Telephone Encounter (Signed)
 Copied from CRM [id number]. Topic: Clinical - Medication Refill >> Aug 15, 2024  4:15 PM Alexandria E wrote: Medication: Insulin  Pen Needle (PEN NEEDLES) 32G X 4 MM MISC  Has the patient contacted their pharmacy? No (Agent: If no, request that the patient contact the pharmacy for the refill. If patient does not wish to contact the pharmacy document the reason why and proceed with request.) (Agent: If yes, when and what did the pharmacy advise?)  This is the patient's preferred pharmacy:  [facility] - [place] - [address] [place] [address] Phone: [phone] Fax: [phone]  Is this the correct pharmacy for this prescription? Yes If no, delete pharmacy and type the correct one.   Has the prescription been filled recently? No  Is the patient out of the medication? Yes  Has the patient been seen for an appointment in the last year OR does the patient have an upcoming appointment? Yes  Can we respond through MyChart? No  Agent: Please be advised that Rx refills may take up to 3 business days. We ask that you follow-up with your pharmacy.

## 2024-08-22 ENCOUNTER — Other Ambulatory Visit: Payer: Self-pay

## 2024-08-23 ENCOUNTER — Other Ambulatory Visit: Payer: Self-pay

## 2024-08-23 DIAGNOSIS — E1165 Type 2 diabetes mellitus with hyperglycemia: Secondary | ICD-10-CM

## 2024-08-23 MED ORDER — DEXCOM G7 SENSOR MISC: 3 refills | Status: AC

## 2024-08-23 NOTE — Telephone Encounter (Signed)
 Copied from CRM #[id number]. Topic: Clinical - Medication Refill >> Aug 23, 2024  2:39 PM Sophia H wrote: Medication: Continuous Glucose Sensor (DEXCOM G7 SENSOR) MISC   Has the patient contacted their pharmacy? Yes, pharmacy advised sent over with no response.    This is the patient's preferred pharmacy:  [facility] - [place], [place] [facility] [place] - [address] [place] [address] Phone: [phone] Fax: [phone]  Is this the correct pharmacy for this prescription? Yes If no, delete pharmacy and type the correct one.   Has the prescription been filled recently? Yes  Is the patient out of the medication? Yes, patient is completely out. Last meter is dead. Needing asap   Has the patient been seen for an appointment in the last year OR does the patient have an upcoming appointment? Yes, scheduled in March   Can we respond through MyChart? No, prefers phone call.   Agent: Please be advised that Rx refills may take up to 3 business days. We ask that you follow-up with your pharmacy.

## 2024-09-07 ENCOUNTER — Ambulatory Visit

## 2024-09-07 VITALS — BP 106/70 | HR 73 | Temp 97.9°F | Resp 16 | Ht 69.0 in | Wt 256.0 lb

## 2024-09-07 DIAGNOSIS — E785 Hyperlipidemia, unspecified: Secondary | ICD-10-CM

## 2024-09-07 DIAGNOSIS — I10 Essential (primary) hypertension: Secondary | ICD-10-CM

## 2024-09-07 DIAGNOSIS — Z6837 Body mass index (BMI) 37.0-37.9, adult: Secondary | ICD-10-CM

## 2024-09-07 DIAGNOSIS — G4733 Obstructive sleep apnea (adult) (pediatric): Secondary | ICD-10-CM

## 2024-09-07 DIAGNOSIS — E1165 Type 2 diabetes mellitus with hyperglycemia: Secondary | ICD-10-CM

## 2024-09-07 MED ORDER — INSULIN LISPRO 200 UNIT/ML ~~LOC~~ SOPN: 10.0000 [IU] | PEN_INJECTOR | Freq: Three times a day (TID) | SUBCUTANEOUS | 3 refills | Status: AC

## 2024-09-07 NOTE — Assessment & Plan Note (Signed)
 Diagnosed with obstructive sleep apnea but inconsistent CPAP use due to falling asleep in recliner. Discussed importance of CPAP use for adequate oxygenation and improved sleep quality, which can aid in weight loss and diabetes management. Explained risks of not using CPAP, including potential machine removal if not used consistently. - Encouraged consistent CPAP use, aiming for at least four nights a week for four hours each night.

## 2024-09-07 NOTE — Patient Instructions (Signed)
" °  VISIT SUMMARY: Today, you came in for a refill of your Humalog  prescription. We discussed your diabetes management, weight, sleep apnea, cholesterol levels, and general health maintenance.  YOUR PLAN: UNCONTROLLED TYPE 2 DIABETES MELLITUS WITH HYPERGLYCEMIA: Your A1c has improved but remains elevated, and your blood glucose levels are still high. -Refilled Humalog  10 units three times daily before meals. -Encouraged healthier diet and portion control. -Recommended home walking workouts to improve blood glucose levels. -Scheduled follow-up in two months for reassessment and blood work.  CLASS 2 OBESITY: Your weight has slightly decreased, and we discussed the importance of weight management for your overall health and diabetes control. -Continue Mounjaro  10 mg weekly. -Encouraged regular exercise and healthier diet to promote weight loss.  OBSTRUCTIVE SLEEP APNEA: You have sleep apnea but are not using your CPAP machine consistently. -Encouraged consistent CPAP use, aiming for at least four nights a week for four hours each night.  HYPERLIPIDEMIA: Your cholesterol levels have improved significantly. -Continue current management and lifestyle modifications.  GENERAL HEALTH MAINTENANCE: You are due for an eye exam and should continue regular dental hygiene practices. -Schedule an eye exam. -Continue regular dental hygiene practices.    Contains text generated by [employer]bridge.   "

## 2024-09-07 NOTE — Assessment & Plan Note (Signed)
 With comorbidities of type 2 diabetes, hypertension, hyperlipidemia and sleep apnea.  Weight decreased from 258 lbs to 256 lbs since December. Current weight management includes Mounjaro  10 mg weekly, with no side effects reported. Discussed potential for higher Mounjaro  dose but decided to maintain current dose to focus on lifestyle changes. Emphasized weight loss benefits for diabetes control and overall health. - Continue Mounjaro  10 mg weekly. - Encouraged regular exercise and healthier diet to promote weight loss.

## 2024-09-07 NOTE — Assessment & Plan Note (Signed)
 Uncontrolled type 2 diabetes mellitus with hyperglycemia WITH CURRENT LONG TERM USE OF INSULIN  A1c decreased from 11.9 to 9.7, but remains elevated. Current blood glucose levels are high, with 61% of readings high and 24% very high. Dexcom shows current glucose at 251 mg/dL. Insulin  use may contribute to weight gain, complicating diabetes management.   Discussed potential hypercortisolism but recent CT scan abdomen in 2020 showed normal adrenals. Emphasized lifestyle modifications over medication reliance. - Refilled Humalog  10 units three times daily before meals.continue Lantus  15 U at bedtime, Farxiga  10 mg daily, metformin  1000 mg TWICE DAILY,  - Encouraged healthier diet and portion control. - Recommended home walking workouts to improve blood glucose levels. - Scheduled follow-up in two months for reassessment and blood work. Orders:   insulin  lispro (HUMALOG ) 200 UNIT/ML KwikPen; Inject 10 Units into the skin 3 (three) times daily before meals.

## 2024-09-07 NOTE — Assessment & Plan Note (Signed)
BP well controlled with losartan 25 mg daily.

## 2024-09-07 NOTE — Progress Notes (Signed)
 "  Subjective:  Patient ID: Jared Wood, male    DOB: [birth year]  Age: 59 y.o. MRN: [id number]  Chief Complaint  Patient presents with   Discuss medication    HPI: Discussed the use of AI scribe software for clinical note transcription with the patient, who gave verbal consent to proceed.   History of Present Illness   Jared Wood is a 59 year old male who presents for a Humalog  prescription refill.  Diabetes mellitus management - Takes Humalog  10 units three times daily before meals;  - Current regimen includes Farxiga  10 mg daily, Lantus  15 units at bedtime, Metformin  1000 mg twice daily, and Mounjaro  10 mg weekly - Last hemoglobin A1c was 9.7, improved from 11.9 - Recent dietary modifications include reducing junk food and increased home-cooked meals  Glycemic control and glucose monitoring - Uses Dexcom continuous glucose monitor - Recent glucose readings: 61% high, 24% very high, 15% in range - Current blood glucose is 251 mg/dL, improved from previous readings over 400 mg/dL  Body weight - Current weight is 256 pounds, decreased from 258 pounds in December  Obstructive sleep apnea - History of sleep apnea - Owns a CPAP machine but uses it inconsistently, often forgetting when falling asleep in recliner  Hepatic steatosis and lipid profile - History of fatty liver diagnosed in 2020 - Recent laboratory results show improved cholesterol: triglycerides decreased from 642 to 299, LDL decreased from 87 to 33 - No proteinuria as of August  Periodontal disease and oral hygiene - History of periodontal disease and dental issues - Financial constraints limit access to dental care - Brushes and flosses regularly to maintain oral hygiene  Substance use - No alcohol or cigarette use           04/09/2024    9:27 AM 03/21/2024    8:06 AM 08/18/2023    8:12 AM 05/17/2023    9:14 AM 05/15/2021   12:39 PM  Depression screen PHQ 2/9  Decreased Interest 0 0  0  0  Down, Depressed, Hopeless 0 0 0 0 0  PHQ - 2 Score 0 0 0 0 0  Altered sleeping 0 0 3 0   Tired, decreased energy 0 0 2 0   Change in appetite 0 0 0 0   Feeling bad or failure about yourself  0 0 0 0   Trouble concentrating 0 0 0 0   Moving slowly or fidgety/restless 0 0 0 0   Suicidal thoughts 0 0 0 0   PHQ-9 Score 0  0  5  0    Difficult doing work/chores Not difficult at all Not difficult at all Not difficult at all Not difficult at all      Data saved with a previous flowsheet row definition        04/09/2024    9:27 AM  Fall Risk   Falls in the past year? 0  Number falls in past yr: 0  Injury with Fall? 0   Risk for fall due to : No Fall Risks  Follow up Falls evaluation completed     Data saved with a previous flowsheet row definition    Patient Care Team: Darek Eifler, MD as PCP - General (Family Medicine) Dann Candyce RAMAN, MD as PCP - Cardiology (Cardiology)   Review of Systems  Constitutional:  Negative for chills, fatigue, fever and unexpected weight change.  HENT:  Negative for congestion, ear pain, sinus pain and sore throat.  Cardiovascular:  Negative for chest pain and palpitations.  Gastrointestinal:  Negative for abdominal pain, blood in stool, constipation, diarrhea, nausea and vomiting.  Endocrine: Negative for polydipsia, polyphagia and polyuria.  Genitourinary:  Negative for dysuria.  Musculoskeletal:  Negative for back pain.  Skin:  Negative for rash.  Neurological:  Negative for headaches.    Medications Ordered Prior to Encounter[1] Past Medical History:  Diagnosis Date   Alteration of awareness 06/17/2019   Anxiety    Arthritis    Chronic migraine without aura, with intractable migraine, so stated, with status migrainosus 06/17/2019   Chronic pain of both knees 07/24/2015   Conversion disorder    Conversion disorder with abnormal movement, acute episode, without psychological stressor 04/12/2015   Conversion disorder with mixed  symptom presentation 06/30/2015   Depression    Diabetes mellitus without complication (HCC)    Type 2   Essential hypertension 04/07/2015   GERD (gastroesophageal reflux disease)    Hard of hearing    Headache 06/30/2015   Heart attack (HCC) 05/2022   Heart disease    Hemiplegic migraine    History of bleeding ulcers    History of Clostridium difficile colitis    History of kidney stones    Hyperlipemia    Hypertension    Left-sided weakness    Migraine    Migraine with aura and without status migrainosus, not intractable 06/17/2019   Mixed hyperlipidemia 04/07/2015   Neuropathy    Pain in joint of left shoulder 07/24/2015   PONV (postoperative nausea and vomiting)    Nausea   Primary osteoarthritis of left knee 08/14/2015   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 was given TPA and underwent rehabilitation.   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 Other wise normal colonoscopy   COLONOSCOPY W/ ENDOSCOPIC US      CORONARY ANGIOPLASTY  2008   CORONARY ANGIOPLASTY WITH STENT PLACEMENT  05/2022   ESOPHAGOGASTRODUODENOSCOPY  09/20/2011   Mild gastritis. Otherwise normal esophagogastroduodenoscopy   HAND SURGERY Left 2001   hand reconstruction   HERNIA REPAIR     LITHOTRIPSY     SHOULDER ARTHROSCOPY WITH OPEN ROTATOR CUFF REPAIR Right    TONSILLECTOMY  1972   TOTAL KNEE ARTHROPLASTY Right 12/01/2015   Procedure: TOTAL KNEE ARTHROPLASTY;   Surgeon: Marcey Raman, MD;  Location: [facility] OR;  Service: Orthopedics;  Laterality: Right;    Family History  Problem Relation Age of Onset   Myocarditis Mother    Prostate cancer Father    Liver cancer Father    Kidney cancer Father    Other Father        lymph nodes   COPD Father    Emphysema Father    Congestive Heart Failure Father    High blood pressure Father    Arthritis Father    Migraines Neg Hx    Social History   Socioeconomic History  Marital status: Married    Spouse name: Not on file   Number of children: 3   Years of education: 2 yrs college   Highest education level: Not on file  Occupational History   Not on file  Tobacco Use   Smoking status: Former   Smokeless tobacco: Current    Types: Chew    Last attempt to quit: 11/2021   Tobacco comments:    uses 1 can/day  Vaping Use   Vaping status: Former  Substance and Sexual Activity   Alcohol use: Yes    Comment: occasional   Drug use: No    Comment: quit hard drugs in 1990   Sexual activity: Yes    Partners: Female  Other Topics Concern   Not on file  Social History Narrative   Lives at home with spouse   Caffeine: not as much as I used to, can drink up to 2-3 cups/day   Social Drivers of Health   Tobacco Use: High Risk (09/07/2024)   Patient History    Smoking Tobacco Use: Former    Smokeless Tobacco Use: Current    Passive Exposure: Not on Actuary Strain: Not on file  Food Insecurity: Low Risk (09/29/2022)   Received from [facility]   Epic    Within the past 12 months, you worried that your food would run out before you got money to buy more: Never true    Within the past 12 months, the food you bought just didn't last and you didn't have money to get more: Not on file  Transportation Needs: No Transportation Needs (09/29/2022)   Received from [facility] visits prior to 10/02/2022.   Transportation    In the past 12 months, has lack of reliable  transportation kept you from medical appointments, meetings, work or from getting things needed for daily living?: No  Physical Activity: Not on file  Stress: Not on file  Social Connections: Not on file  Depression (PHQ2-9): Low Risk (04/09/2024)   Depression (PHQ2-9)    PHQ-2 Score: 0  Alcohol Screen: Low Risk (08/18/2023)   Alcohol Screen    Last Alcohol Screening Score (AUDIT): 0  Housing: Low Risk (09/29/2022)   Received from [facility]   Epic    What is your living situation today?: I have a steady place to live    Think about the place you live. Do you have problems with any of the following? Choose all that apply:: None/None on this list  Utilities: Low Risk (09/29/2022)   Received from [facility]   Utilities    In the past 12 months has the electric, gas, oil, or water company threatened to shut off services in your home? : No  Health Literacy: Not on file    Objective:  BP 106/70   Pulse 73   Temp 97.9 F (36.6 C)   Resp 16   Ht 5' 9 (1.753 m)   Wt 256 lb (116.1 kg)   SpO2 96%   BMI 37.80 kg/m      09/07/2024    8:59 AM 07/31/2024    9:17 AM 07/16/2024    2:03 PM  BP/Weight  Systolic BP 106 116 110  Diastolic BP 70 62 70  Wt. (Lbs) 256 258 257  BMI 37.8 kg/m2 38.1 kg/m2 37.95 kg/m2    Physical Exam Vitals reviewed.  Constitutional:      Appearance: He is obese.  HENT:     Head:  Normocephalic and atraumatic.     Mouth/Throat:     Comments: Poor dentition, broken and discolored teeth noted Cardiovascular:     Rate and Rhythm: Normal rate and regular rhythm.  Pulmonary:     Effort: Pulmonary effort is normal.     Breath sounds: Normal breath sounds.  Musculoskeletal:        General: Normal range of motion.     Cervical back: Normal range of motion.  Neurological:     Mental Status: He is alert.  Psychiatric:        Mood and Affect: Mood normal.         Lab Results  Component Value Date   WBC 6.6 06/21/2024   HGB 13.4 06/21/2024   HCT  40.9 06/21/2024   PLT 245 06/21/2024   GLUCOSE 190 (H) 06/21/2024   CHOL 109 06/21/2024   TRIG 299 (H) 06/21/2024   HDL 31 (L) 06/21/2024   LDLDIRECT 51.6 04/21/2021   LDLCALC 33 06/21/2024   ALT 32 06/21/2024   AST 27 06/21/2024   NA 140 06/21/2024   K 4.1 06/21/2024   CL 102 06/21/2024   CREATININE 0.99 06/21/2024   BUN 15 06/21/2024   CO2 21 06/21/2024   TSH 2.050 05/17/2023   INR 1.0 07/06/2022   HGBA1C 9.7 (H) 06/21/2024    Results for orders placed or performed in visit on 06/21/24  CBC with Differential/Platelet   Collection Time: 06/21/24  8:08 AM  Result Value Ref Range   WBC 6.6 3.4 - 10.8 x10E3/uL   RBC 4.86 4.14 - 5.80 x10E6/uL   Hemoglobin 13.4 13.0 - 17.7 g/dL   Hematocrit 59.0 62.4 - 51.0 %   MCV 84 79 - 97 fL   MCH 27.6 26.6 - 33.0 pg   MCHC 32.8 31.5 - 35.7 g/dL   RDW 85.7 88.3 - 84.5 %   Platelets 245 150 - 450 x10E3/uL   Neutrophils 61 Not Estab. %   Lymphs 27 Not Estab. %   Monocytes 7 Not Estab. %   Eos 4 Not Estab. %   Basos 1 Not Estab. %   Neutrophils Absolute 4.0 1.4 - 7.0 x10E3/uL   Lymphocytes Absolute 1.8 0.7 - 3.1 x10E3/uL   Monocytes Absolute 0.5 0.1 - 0.9 x10E3/uL   EOS (ABSOLUTE) 0.3 0.0 - 0.4 x10E3/uL   Basophils Absolute 0.1 0.0 - 0.2 x10E3/uL   Immature Granulocytes 0 Not Estab. %   Immature Grans (Abs) 0.0 0.0 - 0.1 x10E3/uL  Comprehensive metabolic panel with GFR   Collection Time: 06/21/24  8:08 AM  Result Value Ref Range   Glucose 190 (H) 70 - 99 mg/dL   BUN 15 6 - 24 mg/dL   Creatinine, Ser 9.00 0.76 - 1.27 mg/dL   eGFR 88 >40 fO/fpw/8.26   BUN/Creatinine Ratio 15 9 - 20   Sodium 140 134 - 144 mmol/L   Potassium 4.1 3.5 - 5.2 mmol/L   Chloride 102 96 - 106 mmol/L   CO2 21 20 - 29 mmol/L   Calcium  9.4 8.7 - 10.2 mg/dL   Total Protein 6.9 6.0 - 8.5 g/dL   Albumin 4.3 3.8 - 4.9 g/dL   Globulin, Total 2.6 1.5 - 4.5 g/dL   Bilirubin Total 0.4 0.0 - 1.2 mg/dL   Alkaline Phosphatase 43 (L) 47 - 123 IU/L   AST 27 0 - 40  IU/L   ALT 32 0 - 44 IU/L  Hemoglobin A1c   Collection Time: 06/21/24  8:08 AM  Result  Value Ref Range   Hgb A1c MFr Bld 9.7 (H) 4.8 - 5.6 %   Est. average glucose Bld gHb Est-mCnc 232 mg/dL  Lipid panel   Collection Time: 06/21/24  8:08 AM  Result Value Ref Range   Cholesterol, Total 109 100 - 199 mg/dL   Triglycerides 700 (H) 0 - 149 mg/dL   HDL 31 (L) >60 mg/dL   VLDL Cholesterol Cal 45 (H) 5 - 40 mg/dL   LDL Chol Calc (NIH) 33 0 - 99 mg/dL   Chol/HDL Ratio 3.5 0.0 - 5.0 ratio  .  Assessment & Plan:   Assessment & Plan Uncontrolled type 2 diabetes mellitus with hyperglycemia (HCC) Uncontrolled type 2 diabetes mellitus with hyperglycemia WITH CURRENT LONG TERM USE OF INSULIN  A1c decreased from 11.9 to 9.7, but remains elevated. Current blood glucose levels are high, with 61% of readings high and 24% very high. Dexcom shows current glucose at 251 mg/dL. Insulin  use may contribute to weight gain, complicating diabetes management.   Discussed potential hypercortisolism but recent CT scan abdomen in 2020 showed normal adrenals. Emphasized lifestyle modifications over medication reliance. - Refilled Humalog  10 units three times daily before meals.continue Lantus  15 U at bedtime, Farxiga  10 mg daily, metformin  1000 mg TWICE DAILY,  - Encouraged healthier diet and portion control. - Recommended home walking workouts to improve blood glucose levels. - Scheduled follow-up in two months for reassessment and blood work. Orders:   insulin  lispro (HUMALOG ) 200 UNIT/ML KwikPen; Inject 10 Units into the skin 3 (three) times daily before meals.  Essential (primary) hypertension BP well controlled with losartan  25 mg daily    Hyperlipidemia LDL goal <100 Cholesterol levels improved, with triglycerides decreasing from 642 to 299 and LDL from 87 to 33. Continued monitoring and lifestyle modifications are necessary to maintain these improvements. - Continue current management and lifestyle  modifications. On Crestor  40 mg daily, Tricor  145 mg daily and Vascepa  2 g twice daily.  Will repeat lipids in 2 months     Class 2 obesity with alveolar hypoventilation, serious comorbidity, and body mass index (BMI) of 37.0 to 37.9 in adult [facility]) With comorbidities of type 2 diabetes, hypertension, hyperlipidemia and sleep apnea.  Weight decreased from 258 lbs to 256 lbs since December. Current weight management includes Mounjaro  10 mg weekly, with no side effects reported. Discussed potential for higher Mounjaro  dose but decided to maintain current dose to focus on lifestyle changes. Emphasized weight loss benefits for diabetes control and overall health. - Continue Mounjaro  10 mg weekly. - Encouraged regular exercise and healthier diet to promote weight loss.    OSA (obstructive sleep apnea) Diagnosed with obstructive sleep apnea but inconsistent CPAP use due to falling asleep in recliner. Discussed importance of CPAP use for adequate oxygenation and improved sleep quality, which can aid in weight loss and diabetes management. Explained risks of not using CPAP, including potential machine removal if not used consistently. - Encouraged consistent CPAP use, aiming for at least four nights a week for four hours each night.      Body mass index is 37.8 kg/m.   General health maintenance Due for an eye exam. Discussed importance of regular dental care, but financial constraints limit access to dental services. Tetanus vaccination is up to date. - Schedule an eye exam. - Continue regular dental hygiene practices.          Meds ordered this encounter  Medications   insulin  lispro (HUMALOG ) 200 UNIT/ML KwikPen    Sig: Inject  10 Units into the skin 3 (three) times daily before meals.    Dispense:  6 mL    Refill:  3    No orders of the defined types were placed in this encounter.      Follow-up: Return in about 8 weeks (around 11/02/2024) for chronic disease follow up.  An  After Visit Summary was printed and given to the patient.  Tommy Schimke, MD [facility] [phone]     [1]  Current Outpatient Medications on File Prior to Visit  Medication Sig Dispense Refill   albuterol (VENTOLIN HFA) 108 (90 Base) MCG/ACT inhaler Inhale 2 puffs into the lungs.     ARIPiprazole  (ABILIFY ) 5 MG tablet Take 1 tablet (5 mg total) by mouth daily. 90 tablet 1   aspirin  EC 81 MG tablet Take 81 mg by mouth once.     clopidogrel  (PLAVIX ) 75 MG tablet Take 1 tablet (75 mg total) by mouth daily. 90 tablet 1   Continuous Glucose Sensor (DEXCOM G7 SENSOR) MISC Apply sensor every 10 days 3 each 3   dapagliflozin  propanediol (FARXIGA ) 10 MG TABS tablet Take 1 tablet (10 mg total) by mouth daily. 90 tablet 1   dicyclomine  (BENTYL ) 10 MG capsule Take 1 capsule (10 mg total) by mouth in the morning and at bedtime. 60 capsule 2   escitalopram  (LEXAPRO ) 20 MG tablet Take 1 tablet (20 mg total) by mouth daily. 90 tablet 1   fenofibrate  (TRICOR ) 145 MG tablet TAKE ONE TABLET BY MOUTH DAILY 30 tablet 2   furosemide  (LASIX ) 20 MG tablet Take 1 tablet (20 mg total) by mouth daily. 30 tablet 2   gabapentin  (NEURONTIN ) 300 MG capsule Take 1 capsule (300 mg total) by mouth 3 (three) times daily. 270 capsule 1   icosapent  Ethyl (VASCEPA ) 1 g capsule Take 2 capsules (2 g total) by mouth 2 (two) times daily. 120 capsule 2   insulin  glargine (LANTUS  SOLOSTAR) 100 UNIT/ML Solostar Pen Inject 12 Units into the skin at bedtime. (Patient taking differently: Inject 15 Units into the skin at bedtime.) 15 mL 3   Insulin  Pen Needle (PEN NEEDLES) 32G X 4 MM MISC For Lantus  solostar pen 90 each 0   levocetirizine (XYZAL) 5 MG tablet Take 5 mg by mouth at bedtime.     loratadine-pseudoephedrine (CLARITIN-D 24-HOUR) 10-240 MG 24 hr tablet Take 1 tablet by mouth daily.     losartan  (COZAAR ) 25 MG tablet Take 1 tablet (25 mg total) by mouth daily. 90 tablet 1   metFORMIN  (GLUCOPHAGE ) 1000 MG tablet  Take 1 tablet (1,000 mg total) by mouth 2 (two) times daily. 90 tablet 1   metoprolol  succinate (TOPROL -XL) 25 MG 24 hr tablet Take 1 tablet (25 mg total) by mouth daily. 90 tablet 0   Multiple Vitamins-Minerals (CENTRUM MEN) TABS Take 1 tablet by mouth daily with breakfast.     nitroGLYCERIN  (NITROSTAT ) 0.4 MG SL tablet Place 1 tablet (0.4 mg total) under the tongue every 5 (five) minutes as needed for chest pain. 90 tablet 0   pantoprazole  (PROTONIX ) 20 MG tablet Take 1 tablet (20 mg total) by mouth 2 (two) times daily before a meal. 180 tablet 1   rosuvastatin  (CRESTOR ) 40 MG tablet Take 1 tablet (40 mg total) by mouth daily. 90 tablet 1   sildenafil  (VIAGRA ) 50 MG tablet Take 1 tablet (50 mg total) by mouth daily as needed for erectile dysfunction. 10 tablet 1   tirzepatide  (MOUNJARO ) 10 MG/0.5ML Pen Inject  10 mg into the skin once a week. 6 mL 2   topiramate  (TOPAMAX ) 50 MG tablet Take 1 tablet (50 mg total) by mouth 2 (two) times daily. 180 tablet 2   Ubrogepant  (UBRELVY ) 100 MG TABS Take 1 tablet (100 mg total) by mouth every 2 (two) hours as needed. Maximum 200mg  a day. 16 tablet 11   Glucagon  HCl (GLUCAGON  EMERGENCY) 1 MG/ML SOLR Inject 1 Dose as directed as needed (sever hypoglycemia). (Patient not taking: Reported on 09/07/2024) 1 each 3   No current facility-administered medications on file prior to visit.   "

## 2024-09-07 NOTE — Assessment & Plan Note (Signed)
 Cholesterol levels improved, with triglycerides decreasing from 642 to 299 and LDL from 87 to 33. Continued monitoring and lifestyle modifications are necessary to maintain these improvements. - Continue current management and lifestyle modifications. On Crestor  40 mg daily, Tricor  145 mg daily and Vascepa  2 g twice daily.  Will repeat lipids in 2 months

## 2024-09-28 ENCOUNTER — Other Ambulatory Visit

## 2024-10-02 ENCOUNTER — Ambulatory Visit

## 2024-11-06 ENCOUNTER — Other Ambulatory Visit

## 2024-11-09 ENCOUNTER — Ambulatory Visit

## 2025-01-02 ENCOUNTER — Ambulatory Visit: Admitting: Endocrinology
